# Patient Record
Sex: Female | Born: 1945 | Race: White | Hispanic: No | Marital: Married | Smoking: Current every day smoker
Health system: Southern US, Community
[De-identification: ages and names within clinical notes are randomized; demographics above are authoritative.]

## PROBLEM LIST (undated history)

## (undated) DIAGNOSIS — I219 Acute myocardial infarction, unspecified: Secondary | ICD-10-CM

## (undated) DIAGNOSIS — F4541 Pain disorder exclusively related to psychological factors: Secondary | ICD-10-CM

## (undated) DIAGNOSIS — I351 Nonrheumatic aortic (valve) insufficiency: Secondary | ICD-10-CM

## (undated) DIAGNOSIS — M2550 Pain in unspecified joint: Secondary | ICD-10-CM

## (undated) DIAGNOSIS — I509 Heart failure, unspecified: Secondary | ICD-10-CM

## (undated) DIAGNOSIS — Z860100 Personal history of colon polyps, unspecified: Secondary | ICD-10-CM

## (undated) DIAGNOSIS — M549 Dorsalgia, unspecified: Secondary | ICD-10-CM

## (undated) DIAGNOSIS — Z8709 Personal history of other diseases of the respiratory system: Secondary | ICD-10-CM

## (undated) DIAGNOSIS — J189 Pneumonia, unspecified organism: Secondary | ICD-10-CM

## (undated) DIAGNOSIS — E785 Hyperlipidemia, unspecified: Secondary | ICD-10-CM

## (undated) DIAGNOSIS — M1990 Unspecified osteoarthritis, unspecified site: Secondary | ICD-10-CM

## (undated) DIAGNOSIS — Z720 Tobacco use: Secondary | ICD-10-CM

## (undated) DIAGNOSIS — M2540 Effusion, unspecified joint: Secondary | ICD-10-CM

## (undated) DIAGNOSIS — G8929 Other chronic pain: Secondary | ICD-10-CM

## (undated) DIAGNOSIS — I255 Ischemic cardiomyopathy: Secondary | ICD-10-CM

## (undated) DIAGNOSIS — J410 Simple chronic bronchitis: Secondary | ICD-10-CM

## (undated) DIAGNOSIS — I2510 Atherosclerotic heart disease of native coronary artery without angina pectoris: Secondary | ICD-10-CM

## (undated) HISTORY — PX: BREAST BIOPSY: SHX20

## (undated) HISTORY — PX: ABDOMINAL HYSTERECTOMY: SHX81

## (undated) HISTORY — DX: Heart failure, unspecified: I50.9

## (undated) HISTORY — PX: OTHER SURGICAL HISTORY: SHX169

## (undated) HISTORY — PX: SHOULDER SURGERY: SHX246

## (undated) HISTORY — DX: Acute myocardial infarction, unspecified: I21.9

## (undated) HISTORY — PX: CHOLECYSTECTOMY: SHX55

## (undated) HISTORY — DX: Nonrheumatic aortic (valve) insufficiency: I35.1

## (undated) HISTORY — PX: ESOPHAGOGASTRODUODENOSCOPY: SHX1529

## (undated) HISTORY — PX: ERCP: SHX60

## (undated) HISTORY — PX: TONSILLECTOMY: SUR1361

## (undated) HISTORY — DX: Tobacco use: Z72.0

## (undated) HISTORY — DX: Ischemic cardiomyopathy: I25.5

## (undated) HISTORY — DX: Atherosclerotic heart disease of native coronary artery without angina pectoris: I25.10

## (undated) HISTORY — DX: Hyperlipidemia, unspecified: E78.5

---

## 2011-11-17 DIAGNOSIS — J019 Acute sinusitis, unspecified: Secondary | ICD-10-CM | POA: Diagnosis not present

## 2011-11-17 DIAGNOSIS — J209 Acute bronchitis, unspecified: Secondary | ICD-10-CM | POA: Diagnosis not present

## 2011-11-28 DIAGNOSIS — J189 Pneumonia, unspecified organism: Secondary | ICD-10-CM | POA: Diagnosis not present

## 2012-04-07 DIAGNOSIS — J209 Acute bronchitis, unspecified: Secondary | ICD-10-CM | POA: Diagnosis not present

## 2012-04-07 DIAGNOSIS — S335XXA Sprain of ligaments of lumbar spine, initial encounter: Secondary | ICD-10-CM | POA: Diagnosis not present

## 2012-05-14 DIAGNOSIS — H81399 Other peripheral vertigo, unspecified ear: Secondary | ICD-10-CM | POA: Diagnosis not present

## 2012-06-06 DIAGNOSIS — J189 Pneumonia, unspecified organism: Secondary | ICD-10-CM

## 2012-06-06 DIAGNOSIS — Z8709 Personal history of other diseases of the respiratory system: Secondary | ICD-10-CM

## 2012-06-06 HISTORY — DX: Pneumonia, unspecified organism: J18.9

## 2012-06-06 HISTORY — DX: Personal history of other diseases of the respiratory system: Z87.09

## 2012-09-26 ENCOUNTER — Other Ambulatory Visit: Payer: Self-pay | Admitting: Family Medicine

## 2012-12-14 ENCOUNTER — Emergency Department (HOSPITAL_COMMUNITY)
Admission: EM | Admit: 2012-12-14 | Discharge: 2012-12-14 | Disposition: A | Discharge status: Home / Self Care | Payer: Medicare Other | Attending: Emergency Medicine | Admitting: Emergency Medicine

## 2012-12-14 ENCOUNTER — Encounter (HOSPITAL_COMMUNITY): Payer: Self-pay | Admitting: *Deleted

## 2012-12-14 ENCOUNTER — Emergency Department (HOSPITAL_COMMUNITY): Payer: Medicare Other

## 2012-12-14 DIAGNOSIS — R059 Cough, unspecified: Secondary | ICD-10-CM | POA: Diagnosis not present

## 2012-12-14 DIAGNOSIS — R002 Palpitations: Secondary | ICD-10-CM | POA: Insufficient documentation

## 2012-12-14 DIAGNOSIS — R0602 Shortness of breath: Secondary | ICD-10-CM | POA: Insufficient documentation

## 2012-12-14 DIAGNOSIS — R0789 Other chest pain: Secondary | ICD-10-CM | POA: Diagnosis not present

## 2012-12-14 DIAGNOSIS — J3489 Other specified disorders of nose and nasal sinuses: Secondary | ICD-10-CM | POA: Diagnosis not present

## 2012-12-14 DIAGNOSIS — R05 Cough: Secondary | ICD-10-CM | POA: Insufficient documentation

## 2012-12-14 DIAGNOSIS — I499 Cardiac arrhythmia, unspecified: Secondary | ICD-10-CM | POA: Diagnosis not present

## 2012-12-14 DIAGNOSIS — R079 Chest pain, unspecified: Secondary | ICD-10-CM | POA: Diagnosis not present

## 2012-12-14 LAB — CBC
MCHC: 34.4 g/dL (ref 30.0–36.0)
Platelets: 287 10*3/uL (ref 150–400)
RDW: 14.8 % (ref 11.5–15.5)
WBC: 11.1 10*3/uL — ABNORMAL HIGH (ref 4.0–10.5)

## 2012-12-14 LAB — BASIC METABOLIC PANEL
Chloride: 99 mEq/L (ref 96–112)
Creatinine, Ser: 0.82 mg/dL (ref 0.50–1.10)
GFR calc Af Amer: 85 mL/min — ABNORMAL LOW (ref >90)
GFR calc non Af Amer: 73 mL/min — ABNORMAL LOW (ref >90)
Potassium: 3.9 mEq/L (ref 3.5–5.1)

## 2012-12-14 LAB — TROPONIN I: Troponin I: <0.3 ng/mL (ref <0.30)

## 2012-12-14 LAB — POCT I-STAT TROPONIN I

## 2012-12-14 NOTE — ED Notes (Signed)
Pt in c/o chest pressure and shortness of breath over the last week, states the symptoms have been intermittent, states she has been having episodes of palpitations where she feels like her heart is racing, feels like it is fluttering. C/o chest "discomfort" at this time, denies shortness of breath at this time. Pt states when she has more severe episodes of chest pain that it radiates into her neck, also c/o dizziness.

## 2012-12-14 NOTE — ED Provider Notes (Signed)
History    CSN: [id number] Arrival date & time 12/14/12  1158  First MD Initiated Contact with Patient 12/14/12 1212     Chief Complaint  Patient presents with  . Chest Pain   (Consider location/radiation/quality/duration/timing/severity/associated sxs/prior Treatment) HPI Comments: Pt is a 67yo female with about 1 week of intermittent chest discomfort/palpitations and SOB. Episodes last several minutes and occur at random without known triggers, and resolve spontaneously. Pt reports "several" short episodes over the past 5-7 days and state they have never happened before. Pt describes the sensation of her heart "fluttering" in her chest and occasionally making it difficult for her to breathe. Pt has no current frank pain but some vague discomfort. Pain does not radiate to her neck or shoulders or back. Discomfort is not worse with deep breathing and is not crushing in nature. Pt does not take any medications and has no known significant history but does not have a regular physician. She was seen at Urgent Care today and sent for ED evaluation.  Otherwise pt has few symptoms. Has had some cough/congestion with greenish phlegm production but no fevers/chills, sick contacts, LE swelling, abdominal pain, change in bowel/bladder habits.  Patient is a 67 y.o. female presenting with chest pain. The history is provided by the patient and the spouse. No language interpreter was used.  Chest Pain Associated symptoms: cough and shortness of breath   Associated symptoms: no abdominal pain, no back pain, no dizziness, no fever, no nausea, no numbness, not vomiting and no weakness    History reviewed. No pertinent past medical history. History reviewed. No pertinent past surgical history. History reviewed. No pertinent family history. History  Substance Use Topics  . Smoking status: Not on file  . Smokeless tobacco: Not on file  . Alcohol Use: Not on file   OB History   Grav Para Term Preterm  Abortions TAB SAB Ect Mult Living                 Review of Systems  Constitutional: Negative for fever and chills.  HENT: Positive for congestion and rhinorrhea. Negative for sore throat and sneezing.   Respiratory: Positive for cough, chest tightness and shortness of breath. Negative for wheezing.   Cardiovascular: Positive for chest pain. Negative for leg swelling.  Gastrointestinal: Negative for nausea, vomiting, abdominal pain and diarrhea.  Genitourinary: Negative for dysuria, urgency, frequency and hematuria.  Musculoskeletal: Negative for back pain and joint swelling.  Skin: Negative for rash.  Neurological: Negative for dizziness, syncope, speech difficulty, weakness and numbness.    Allergies  Review of patient's allergies indicates no known allergies.  Home Medications   Current Outpatient Rx  Name  Route  Sig  Dispense  Refill  . acetaminophen (TYLENOL) 500 MG tablet   Oral   Take 500 mg by mouth every 6 (six) hours as needed for pain.         . pseudoephedrine-guaifenesin (MUCINEX D) 60-600 MG per tablet   Oral   Take 1 tablet by mouth every 12 (twelve) hours.          BP 137/71  Pulse 83  Temp(Src) 97.6 F (36.4 C) (Oral)  Resp 20  Wt 195 lb (88.451 kg)  SpO2 94% Physical Exam  Nursing note and vitals reviewed. Constitutional: She is oriented to person, place, and time. She appears well-developed and well-nourished. No distress.  HENT:  Head: Normocephalic and atraumatic.  Eyes: Conjunctivae are normal. Pupils are equal, round, and reactive to light.  Neck: Normal range of motion. Neck supple.  Cardiovascular: Normal rate, regular rhythm and normal heart sounds.   No murmur heard. Pulmonary/Chest: Effort normal and breath sounds normal. No respiratory distress. She has no wheezes. She has no rales. She exhibits tenderness and bony tenderness. She exhibits no crepitus, no edema and no deformity.  Parasternal and left chest wall tenderness to  palpation, mild/moderate  Abdominal: Soft. Bowel sounds are normal. She exhibits no distension. There is no tenderness.  Musculoskeletal: Normal range of motion. She exhibits no edema and no tenderness.  Neurological: She is alert and oriented to person, place, and time. No cranial nerve deficit.  Skin: Skin is warm and dry. She is not diaphoretic.  Psychiatric: She has a normal mood and affect. Her behavior is normal.    ED Course  Procedures (including critical care time)  Date: 12/14/2012   Rate: 93   Rhythm: sinus with several extra/early complexes (?junctional beats)   QRS Axis: normal   Intervals: normal   ST/T Wave abnormalities: none   Conduction Disutrbances: extra early complexes   Narrative Interpretation: sinus rhythm with early complexes, PAC's vs junctional beats   Old EKG Reviewed: none available  1235: Exam and hx as above, pt hemodynamically stable without active complaints. DDx intermittent afib/aflutter, early PAC's/junctional beats. Some possible MSK chest discomfort as well. Will check CXR, labs, troponin, then reassess.  1315: POC troponin negative. CXR, CBC, BMP, and serum troponin still pending.   1350: CBC, BMP unremarkable.  Labs Reviewed  CBC - Abnormal; Notable for the following:    WBC 11.1 (*)    All other components within normal limits  BASIC METABOLIC PANEL - Abnormal; Notable for the following:    GFR calc non Af Amer 73 (*)    GFR calc Af Amer 85 (*)    All other components within normal limits  TROPONIN I  POCT I-STAT TROPONIN I   Dg Chest 2 View  12/14/2012   *RADIOLOGY REPORT*  Clinical Data: Chest pain  CHEST - 2 VIEW  Comparison: None.  Findings: Cardiomediastinal silhouette is unremarkable.  No acute infiltrate or pleural effusion.  No pulmonary edema.  Bony thorax is unremarkable.  IMPRESSION: No active disease.   Original Report Authenticated By: Natasha Mead, M.D.   1. Chest discomfort   2. Irregular heart beats     MDM  [phone]  female with intermittent chest discomfort. EKG concerning for extra complexes and history suggestive of possible SVT. Doubt MI with chest discomfort >1 week and negative troponin. Doubt PNA with normal CXR. Pt with elevated BP but no meds at home and no PCP. Advised pt to establish with a PCP. Also provided with number for [facility]. Strict return precautions discussed.  The above was discussed in its entirety with attending ED physician Dr. Deretha Emory.  Bobbye Morton, MD  PGY-2, [facility] Medicine  Bobbye Morton, MD 12/14/12 [phone]

## 2012-12-14 NOTE — ED Provider Notes (Signed)
I saw and evaluated the patient, reviewed the resident's note and I agree with the findings and plan.  Patient referred in from urgent care in [place]. Patient's had one week history of chest pressure that waxes and wanes but never goes away. Also associated with shortness of breath patient was concerned because of shortness of breath and cough that maybe she had developed a pneumonia. She's also been having trouble with heart palpitations and heart racing. Today on the monitor she has an occasional PAC no significant tachycardia. Chest x-ray is pending. Troponin was negative so not likely acute cardiac event. EKG also has no acute changes consistent with a cardiac event. If labs are all negative patient can be discharged with followup with cardiology and assistance in finding a primary care Dr.  Barbaraann Faster EKG reviewed by me and concur with the resident's interpretation.     Shelda Jakes, MD 12/14/12 [phone]

## 2012-12-14 NOTE — ED Notes (Signed)
Family at bedside. 

## 2012-12-17 NOTE — ED Provider Notes (Signed)
I saw and evaluated the patient, reviewed the resident's note and I agree with the findings and plan.   Shelda Jakes, MD 12/17/12 [phone]

## 2013-01-10 ENCOUNTER — Ambulatory Visit (INDEPENDENT_AMBULATORY_CARE_PROVIDER_SITE_OTHER): Payer: Medicare Other | Admitting: Cardiovascular Disease

## 2013-01-10 ENCOUNTER — Encounter: Payer: Self-pay | Admitting: Cardiovascular Disease

## 2013-01-10 VITALS — BP 134/80 | HR 73 | Ht 62.5 in | Wt 194.0 lb

## 2013-01-10 DIAGNOSIS — R5381 Other malaise: Secondary | ICD-10-CM

## 2013-01-10 DIAGNOSIS — E785 Hyperlipidemia, unspecified: Secondary | ICD-10-CM | POA: Diagnosis not present

## 2013-01-10 DIAGNOSIS — Z79899 Other long term (current) drug therapy: Secondary | ICD-10-CM

## 2013-01-10 DIAGNOSIS — R079 Chest pain, unspecified: Secondary | ICD-10-CM | POA: Diagnosis not present

## 2013-01-10 DIAGNOSIS — Z8249 Family history of ischemic heart disease and other diseases of the circulatory system: Secondary | ICD-10-CM | POA: Insufficient documentation

## 2013-01-10 DIAGNOSIS — R5383 Other fatigue: Secondary | ICD-10-CM

## 2013-01-10 MED ORDER — ASPIRIN EC 81 MG PO TBEC: 81.0000 mg | DELAYED_RELEASE_TABLET | Freq: Every day | ORAL | Status: DC

## 2013-01-10 NOTE — Progress Notes (Signed)
   01/10/2013 Terri [name]   11-Jun-1945  [id number]  Primary Physician No PCP Per Patient Primary Cardiologist: Runell Gess MD Roseanne Reno   HPI:  Terri [name] is a 67 year old mildly overweight married Caucasian female mother of 2, grandmother for grandchildren who has no primary care physician. She was replacing most of the hospital emergency room with a dilation tachypalpitations the setting of substernal chest pain and sent home tissues and was referred here for further evaluation. Her only cardiac risk factors include 25 pack years of tobacco abuse currently smoking one half pack per day. She has actually strong family history heart disease with both parents and multiple siblings with either died or had-related issues. She is retired Environmental health practitioner. 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   Current Outpatient Prescriptions  Medication Sig Dispense Refill  . acetaminophen (TYLENOL) 500 MG tablet Take 500 mg by mouth every 6 (six) hours as needed for pain.       No current facility-administered medications for this visit.    No Known Allergies  History   Social History  . Marital Status: Married    Spouse Name: N/A    Number of Children: N/A  . Years of Education: N/A   Occupational History  . Not on file.   Social History Main Topics  . Smoking status: Light Tobacco Smoker -- 0.25 packs/day for 52 years    Types: Cigarettes  . Smokeless tobacco: Never Used  . Alcohol Use: No  . Drug Use: No  . Sexually Active: Not on file   Other Topics Concern  . Not on file   Social History Narrative  . No narrative on file     Review of Systems: General: negative for chills, fever, night sweats or weight changes.  Cardiovascular: negative for chest pain, dyspnea on exertion, edema, orthopnea, palpitations, paroxysmal nocturnal dyspnea or shortness of breath Dermatological: negative for  rash Respiratory: negative for cough or wheezing Urologic: negative for hematuria Abdominal: negative for nausea, vomiting, diarrhea, bright red blood per rectum, melena, or hematemesis Neurologic: negative for visual changes, syncope, or dizziness All other systems reviewed and are otherwise negative except as noted above.    Blood pressure 134/80, pulse 73, height 5' 2.5" (1.588 m), weight 194 lb (87.998 kg).  General appearance: alert and no distress Neck: no adenopathy, no carotid bruit, no JVD, supple, symmetrical, trachea midline and thyroid not enlarged, symmetric, no tenderness/mass/nodules Lungs: clear to auscultation bilaterally Heart: regular rate and rhythm, S1, S2 normal, no murmur, click, rub or gallop Abdomen: soft, non-tender; bowel sounds normal; no masses,  no organomegaly Extremities: extremities normal, atraumatic, no cyanosis or edema Pulses: 2+ and symmetric  EKG vital signs rhythm at 73 without ST or T wave changes  ASSESSMENT AND PLAN:   Chest pain 67 year old female with a strong family history heart disease and recent onset substernal chest pain which sounds ischemic. Her parents and basically all of her siblings have either died or had a heart-related issues. I'm going to get a 2-D echo and a Lexiscan Myoview as well as start her on low-dose aspirin and tender to upper LC back after that for further evaluation. She was recently to [facility] for violation of tachycardia palpitations.      Runell Gess MD FACP,FACC,FAHA, [facility] 01/10/2013 4:47 PM

## 2013-01-10 NOTE — Assessment & Plan Note (Signed)
67 year old female with a strong family history heart disease and recent onset substernal chest pain which sounds ischemic. Her parents and basically all of her siblings have either died or had a heart-related issues. I'm going to get a 2-D echo and a Lexiscan Myoview as well as start her on low-dose aspirin and tender to upper LC back after that for further evaluation. She was recently to [facility] for violation of tachycardia palpitations.

## 2013-01-10 NOTE — Patient Instructions (Signed)
  Your physician wants you to follow-up with him after the test                                                         You will receive a reminder letter in the mail one month in advance. If you don't receive a letter, please call our office to schedule the follow-up appointment.   Your physician recommends that you return for lab work in the next couple of days   Your physician has recommended you make the following change in your medication: start Aspirin 81mg  daily   Your physician has ordered the following tests: echocardiogram and lexiscan myoview

## 2013-01-11 ENCOUNTER — Ambulatory Visit (HOSPITAL_COMMUNITY)
Admission: RE | Admit: 2013-01-11 | Discharge: 2013-01-11 | Disposition: A | Discharge status: Home / Self Care | Payer: Medicare Other | Source: Ambulatory Visit | Attending: Internal Medicine | Admitting: Internal Medicine

## 2013-01-11 DIAGNOSIS — E669 Obesity, unspecified: Secondary | ICD-10-CM | POA: Diagnosis not present

## 2013-01-11 DIAGNOSIS — R079 Chest pain, unspecified: Secondary | ICD-10-CM | POA: Insufficient documentation

## 2013-01-11 DIAGNOSIS — R42 Dizziness and giddiness: Secondary | ICD-10-CM | POA: Diagnosis not present

## 2013-01-11 DIAGNOSIS — R000 Tachycardia, unspecified: Secondary | ICD-10-CM | POA: Diagnosis not present

## 2013-01-11 DIAGNOSIS — R0602 Shortness of breath: Secondary | ICD-10-CM | POA: Insufficient documentation

## 2013-01-11 DIAGNOSIS — R5383 Other fatigue: Secondary | ICD-10-CM | POA: Insufficient documentation

## 2013-01-11 DIAGNOSIS — R0609 Other forms of dyspnea: Secondary | ICD-10-CM | POA: Insufficient documentation

## 2013-01-11 DIAGNOSIS — R5381 Other malaise: Secondary | ICD-10-CM | POA: Diagnosis not present

## 2013-01-11 DIAGNOSIS — F17200 Nicotine dependence, unspecified, uncomplicated: Secondary | ICD-10-CM | POA: Insufficient documentation

## 2013-01-11 DIAGNOSIS — R0989 Other specified symptoms and signs involving the circulatory and respiratory systems: Secondary | ICD-10-CM | POA: Insufficient documentation

## 2013-01-11 DIAGNOSIS — Z8249 Family history of ischemic heart disease and other diseases of the circulatory system: Secondary | ICD-10-CM | POA: Insufficient documentation

## 2013-01-11 MED ORDER — TECHNETIUM TC 99M SESTAMIBI GENERIC - CARDIOLITE
31.3000 | Freq: Once | INTRAVENOUS | Status: AC | PRN
  Administered 2013-01-11: 31.3 via INTRAVENOUS

## 2013-01-11 MED ORDER — TECHNETIUM TC 99M SESTAMIBI GENERIC - CARDIOLITE
11.0000 | Freq: Once | INTRAVENOUS | Status: AC | PRN
  Administered 2013-01-11: 11 via INTRAVENOUS

## 2013-01-11 MED ORDER — REGADENOSON 0.4 MG/5ML IV SOLN
0.4000 mg | Freq: Once | INTRAVENOUS | Status: AC
  Administered 2013-01-11: 0.4 mg via INTRAVENOUS

## 2013-01-11 NOTE — Procedures (Addendum)
[place] [facility] [address] [place] [address] [phone]  Cardiology Nuclear Med Study  Terri Terri [name] is Terri [name] 67 y.o. female     MRN : [id number]     DOB: 1945/08/02  Procedure Date: 01/11/2013  Nuclear Med Background Indication for Stress Test:  Evaluation for Ischemia and Post Hospital History:  NO PRIOR HISTORY Cardiac Risk Factors: Family History - CAD, Obesity and Smoker  Symptoms:  Chest Pain, Dizziness, DOE, Fatigue, Light-Headedness, Palpitations and SOB   Nuclear Pre-Procedure Caffeine/Decaff Intake:  7:00pm NPO After: 5:00am   IV Site: R Wrist  IV 0.9% NS with Angio Cath:  22g  Chest Size (in):  N/Terri [name] IV Started by: Emmit Pomfret, RN  Height: 5' 2.5" (1.588 m)  Cup Size: DD  BMI:  Body mass index is 34.9 kg/(m^2). Weight:  194 lb (87.998 kg)   Tech Comments:  N/Terri [name]    Nuclear Med Study 1 or 2 day study: 1 day  Stress Test Type:  Lexiscan  Order Authorizing Provider:  Nanetta Batty, MD   Resting Radionuclide: Technetium 96m Sestamibi  Resting Radionuclide Dose: 11.0 mCi   Stress Radionuclide:  Technetium 78m Sestamibi  Stress Radionuclide Dose: 31.3 mCi           Stress Protocol Rest HR: 71 Stress HR: 95  Rest BP: 136/89 Stress BP: 152/82  Exercise Time (min): n/Terri [name] METS: n/Terri [name]          Dose of Adenosine (mg):  n/Terri [name] Dose of Lexiscan: 0.4 mg  Dose of Atropine (mg): n/Terri [name] Dose of Dobutamine: n/Terri [name] mcg/kg/min (at max HR)  Stress Test Technologist: Ernestene Mention, CCT Nuclear Technologist: Gonzella Lex, CNMT   Rest Procedure:  Myocardial perfusion imaging was performed at rest 45 minutes following the intravenous administration of Technetium 70m Sestamibi. Stress Procedure:  The patient received IV Lexiscan 0.4 mg over 15-seconds.  Technetium 62m Sestamibi injected at 30-seconds.  There were no significant changes with Lexiscan.  Quantitative spect images were obtained after Terri [name].  Transient Ischemic  Dilatation (Normal <1.22):  1.23 Lung/Heart Ratio (Normal <0.45):  0.25 QGS EDV:  67 ml QGS ESV:  19 ml LV Ejection Fraction: 72%  Signed by     Rest ECG: NSR - Normal EKG  Stress ECG: No significant change from baseline ECG  QPS Raw Data Images:  Normal; no motion artifact; normal heart/lung ratio. Stress Images:  Normal homogeneous uptake in all areas of the myocardium. Rest Images:  Normal homogeneous uptake in all areas of the myocardium. Subtraction (SDS):  Normal  Impression Exercise Capacity:  Lexiscan with no exercise. BP Response:  Normal blood pressure response. Clinical Symptoms:  No significant symptoms noted. ECG Impression:  No significant ST segment change suggestive of ischemia. Comparison with Prior Nuclear Study: No images to compare  Overall Impression:  Normal stress nuclear study.  LV Wall Motion:  NL LV Function, EF 72%; NL Wall Motion   Terri Terri [name] A, MD  01/11/2013 1:05 PM

## 2013-01-13 ENCOUNTER — Encounter: Payer: Self-pay | Admitting: *Deleted

## 2013-01-14 ENCOUNTER — Ambulatory Visit (HOSPITAL_COMMUNITY)
Admission: RE | Admit: 2013-01-14 | Discharge: 2013-01-14 | Disposition: A | Discharge status: Home / Self Care | Payer: Medicare Other | Source: Ambulatory Visit | Attending: Cardiovascular Disease | Admitting: Cardiovascular Disease

## 2013-01-14 DIAGNOSIS — I079 Rheumatic tricuspid valve disease, unspecified: Secondary | ICD-10-CM | POA: Insufficient documentation

## 2013-01-14 DIAGNOSIS — R079 Chest pain, unspecified: Secondary | ICD-10-CM | POA: Diagnosis not present

## 2013-01-14 DIAGNOSIS — I359 Nonrheumatic aortic valve disorder, unspecified: Secondary | ICD-10-CM | POA: Diagnosis not present

## 2013-01-14 DIAGNOSIS — R5383 Other fatigue: Secondary | ICD-10-CM | POA: Diagnosis not present

## 2013-01-14 DIAGNOSIS — E785 Hyperlipidemia, unspecified: Secondary | ICD-10-CM | POA: Insufficient documentation

## 2013-01-14 DIAGNOSIS — Z79899 Other long term (current) drug therapy: Secondary | ICD-10-CM | POA: Diagnosis not present

## 2013-01-14 LAB — COMPREHENSIVE METABOLIC PANEL
ALT: 12 U/L (ref 0–35)
AST: 9 U/L (ref 0–37)
Albumin: 3.9 g/dL (ref 3.5–5.2)
CO2: 26 mEq/L (ref 19–32)
Calcium: 9.4 mg/dL (ref 8.4–10.5)
Chloride: 105 mEq/L (ref 96–112)
Creat: 0.81 mg/dL (ref 0.50–1.10)
Potassium: 4.6 mEq/L (ref 3.5–5.3)
Total Protein: 6.6 g/dL (ref 6.0–8.3)

## 2013-01-14 LAB — CBC
Hemoglobin: 13.3 g/dL (ref 12.0–15.0)
MCH: 28.8 pg (ref 26.0–34.0)
Platelets: 277 10*3/uL (ref 150–400)
RBC: 4.62 MIL/uL (ref 3.87–5.11)
WBC: 8 10*3/uL (ref 4.0–10.5)

## 2013-01-14 LAB — LIPID PANEL
Cholesterol: 243 mg/dL — ABNORMAL HIGH (ref 0–200)
VLDL: 70 mg/dL — ABNORMAL HIGH (ref 0–40)

## 2013-01-14 LAB — TSH: TSH: 2.508 u[IU]/mL (ref 0.350–4.500)

## 2013-01-14 NOTE — Progress Notes (Signed)
2D Echo Performed 01/14/2013    Clearence Ped, RCS

## 2013-01-15 ENCOUNTER — Encounter: Payer: Self-pay | Admitting: Cardiovascular Disease

## 2013-01-16 ENCOUNTER — Ambulatory Visit (INDEPENDENT_AMBULATORY_CARE_PROVIDER_SITE_OTHER): Payer: Medicare Other | Admitting: Cardiovascular Disease

## 2013-01-16 ENCOUNTER — Encounter: Payer: Self-pay | Admitting: Cardiovascular Disease

## 2013-01-16 VITALS — BP 134/82 | HR 72 | Ht 62.5 in | Wt 195.4 lb

## 2013-01-16 DIAGNOSIS — E785 Hyperlipidemia, unspecified: Secondary | ICD-10-CM

## 2013-01-16 DIAGNOSIS — Z79899 Other long term (current) drug therapy: Secondary | ICD-10-CM | POA: Diagnosis not present

## 2013-01-16 DIAGNOSIS — R079 Chest pain, unspecified: Secondary | ICD-10-CM

## 2013-01-16 MED ORDER — ATORVASTATIN CALCIUM 20 MG PO TABS: 20.0000 mg | ORAL_TABLET | Freq: Every day | ORAL | Status: DC

## 2013-01-16 NOTE — Patient Instructions (Addendum)
  Your physician wants you to follow-up with him in : 6 months                                            and with an extender in : 3 months                    You will receive a reminder letter in the mail one month in advance. If you don't receive a letter, please call our office to schedule the follow-up appointment.   Your physician recommends that you return for lab work in: 2-3 months, fasting   Your physician has recommended you make the following change in your medication: start atorvastatin 20mg  daily

## 2013-01-16 NOTE — Assessment & Plan Note (Signed)
2-D echocardiogram and Myoview stress test were entirely normal. I reassured the patient. She is under a lot of stress at home.

## 2013-01-16 NOTE — Progress Notes (Signed)
01/16/2013 Terri [name]   [birth year]  [id number]  Primary Physician No PCP Per Patient Primary Cardiologist: Runell Gess MD Roseanne Reno  HPI:  [name] is a 67 year old mildly overweight married Caucasian female mother of 2, grandmother for grandchildren who has no primary care physician. She was replacing most of the hospital emergency room with a dilation tachypalpitations the setting of substernal chest pain and sent home tissues and was referred here for further evaluation. Her only cardiac risk factors include 25 pack years of tobacco abuse currently smoking one half pack per day. She has actually strong family history heart disease with both parents and multiple siblings with either died or had-related issues. She is retired Environmental health practitioner. 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 Since I saw her in the office approximately one week ago a 2-D echo was performed which was entirely normal as was a Myoview stress test. She's had no recurrent symptoms. She does admit to a lot of stress at home. A lipid profile performed 01/14/13 revealed a total cholesterol of 243, LDL of 141 and HDL of 32.    Current Outpatient Prescriptions  Medication Sig Dispense Refill  . acetaminophen (TYLENOL) 500 MG tablet Take 500 mg by mouth every 6 (six) hours as needed for pain.      Marland Kitchen aspirin EC 81 MG tablet Take 1 tablet (81 mg total) by mouth daily.  90 tablet  3  . atorvastatin (LIPITOR) 20 MG tablet Take 1 tablet (20 mg total) by mouth daily.  30 tablet  6   No current facility-administered medications for this visit.    No Known Allergies  History   Social History  . Marital Status: Married    Spouse Name: N/A    Number of Children: N/A  . Years of Education: N/A   Occupational History  . Not on file.   Social History Main Topics  . Smoking status: Light Tobacco Smoker -- 0.25 packs/day for 52 years    Types:  Cigarettes  . Smokeless tobacco: Never Used  . Alcohol Use: No  . Drug Use: No  . Sexual Activity: Not on file   Other Topics Concern  . Not on file   Social History Narrative  . No narrative on file     Review of Systems: General: negative for chills, fever, night sweats or weight changes.  Cardiovascular: negative for chest pain, dyspnea on exertion, edema, orthopnea, palpitations, paroxysmal nocturnal dyspnea or shortness of breath Dermatological: negative for rash Respiratory: negative for cough or wheezing Urologic: negative for hematuria Abdominal: negative for nausea, vomiting, diarrhea, bright red blood per rectum, melena, or hematemesis Neurologic: negative for visual changes, syncope, or dizziness All other systems reviewed and are otherwise negative except as noted above.    Blood pressure 134/82, pulse 72, height 5' 2.5" (1.588 m), weight 195 lb 6.4 oz (88.633 kg).  General appearance: alert and no distress Neck: no adenopathy, no carotid bruit, no JVD, supple, symmetrical, trachea midline and thyroid not enlarged, symmetric, no tenderness/mass/nodules Lungs: clear to auscultation bilaterally Heart: regular rate and rhythm, S1, S2 normal, no murmur, click, rub or gallop Extremities: extremities normal, atraumatic, no cyanosis or edema  EKG not performed today  ASSESSMENT AND PLAN:   Chest pain 2-D echocardiogram and Myoview stress test were entirely normal. I reassured the patient. She is under a lot of stress at home.  Hyperlipidemia Her fasting lipid profile performed 01/14/13  revealed a glucose of 243, LDL of 141 HDL 32. Based on this and her risk factor profile I am going to begin her on Lipitor  20 mg a day as well as Co Q 10 and will recheck a lipid and liver profile in 2 months.      Runell Gess MD FACP,FACC,FAHA, [facility] 01/16/2013 11:03 AM

## 2013-01-16 NOTE — Assessment & Plan Note (Signed)
Her fasting lipid profile performed 01/14/13 revealed a glucose of 243, LDL of 141 HDL 32. Based on this and her risk factor profile I am going to begin her on Lipitor  20 mg a day as well as Co Q 10 and will recheck a lipid and liver profile in 2 months.

## 2013-01-30 ENCOUNTER — Telehealth: Payer: Self-pay | Admitting: *Deleted

## 2013-01-30 DIAGNOSIS — Z79899 Other long term (current) drug therapy: Secondary | ICD-10-CM

## 2013-01-30 DIAGNOSIS — E785 Hyperlipidemia, unspecified: Secondary | ICD-10-CM

## 2013-01-30 MED ORDER — ATORVASTATIN CALCIUM 40 MG PO TABS: 40.0000 mg | ORAL_TABLET | Freq: Every day | ORAL | Status: DC

## 2013-01-30 NOTE — Telephone Encounter (Signed)
I spoke with patient and informed her of lab results.  She is currently only taking atorvastatin 20mg .  I will speak with Dr Allyson Sabal about increasing the dose to 40mg  vs 80mg .   I spoke with Dr Allyson Sabal.  I received a voice order from Dr Allyson Sabal to increase atorvastatin to 40mg  daily, not the 80mg  daily.  Pt notified. RX sent and will mail lab order to recheck

## 2013-01-30 NOTE — Telephone Encounter (Signed)
Message copied by Marella Bile on Wed Jan 30, 2013  9:59 AM ------      Message from: Runell Gess      Created: Thu Jan 24, 2013  7:49 PM       Increase atorvastatin to 80 mg and re check ------

## 2013-02-25 ENCOUNTER — Encounter: Payer: Medicare Other | Admitting: Cardiology

## 2013-03-09 DIAGNOSIS — J019 Acute sinusitis, unspecified: Secondary | ICD-10-CM | POA: Diagnosis not present

## 2013-03-09 DIAGNOSIS — J209 Acute bronchitis, unspecified: Secondary | ICD-10-CM | POA: Diagnosis not present

## 2013-03-18 DIAGNOSIS — R0609 Other forms of dyspnea: Secondary | ICD-10-CM | POA: Diagnosis not present

## 2013-03-18 DIAGNOSIS — R05 Cough: Secondary | ICD-10-CM | POA: Diagnosis not present

## 2013-03-18 DIAGNOSIS — J0100 Acute maxillary sinusitis, unspecified: Secondary | ICD-10-CM | POA: Diagnosis not present

## 2013-04-22 ENCOUNTER — Ambulatory Visit: Payer: Medicare Other | Admitting: Cardiology

## 2013-04-26 DIAGNOSIS — J00 Acute nasopharyngitis [common cold]: Secondary | ICD-10-CM | POA: Diagnosis not present

## 2013-10-16 DIAGNOSIS — H2510 Age-related nuclear cataract, unspecified eye: Secondary | ICD-10-CM | POA: Diagnosis not present

## 2013-11-20 DIAGNOSIS — H2510 Age-related nuclear cataract, unspecified eye: Secondary | ICD-10-CM | POA: Diagnosis not present

## 2013-11-20 DIAGNOSIS — H40039 Anatomical narrow angle, unspecified eye: Secondary | ICD-10-CM | POA: Diagnosis not present

## 2013-12-09 DIAGNOSIS — H2589 Other age-related cataract: Secondary | ICD-10-CM | POA: Diagnosis not present

## 2013-12-23 DIAGNOSIS — Z961 Presence of intraocular lens: Secondary | ICD-10-CM | POA: Diagnosis not present

## 2013-12-23 DIAGNOSIS — H2510 Age-related nuclear cataract, unspecified eye: Secondary | ICD-10-CM | POA: Diagnosis not present

## 2013-12-23 DIAGNOSIS — H269 Unspecified cataract: Secondary | ICD-10-CM | POA: Diagnosis not present

## 2013-12-23 DIAGNOSIS — H2589 Other age-related cataract: Secondary | ICD-10-CM | POA: Diagnosis not present

## 2014-01-20 DIAGNOSIS — H2589 Other age-related cataract: Secondary | ICD-10-CM | POA: Diagnosis not present

## 2014-01-20 DIAGNOSIS — H2510 Age-related nuclear cataract, unspecified eye: Secondary | ICD-10-CM | POA: Diagnosis not present

## 2014-01-20 DIAGNOSIS — H269 Unspecified cataract: Secondary | ICD-10-CM | POA: Diagnosis not present

## 2014-01-27 DIAGNOSIS — H2510 Age-related nuclear cataract, unspecified eye: Secondary | ICD-10-CM | POA: Diagnosis not present

## 2014-01-27 DIAGNOSIS — Z961 Presence of intraocular lens: Secondary | ICD-10-CM | POA: Diagnosis not present

## 2014-04-08 DIAGNOSIS — Z23 Encounter for immunization: Secondary | ICD-10-CM | POA: Diagnosis not present

## 2014-06-04 DIAGNOSIS — M545 Low back pain: Secondary | ICD-10-CM | POA: Diagnosis not present

## 2014-06-04 DIAGNOSIS — M7061 Trochanteric bursitis, right hip: Secondary | ICD-10-CM | POA: Diagnosis not present

## 2014-06-20 ENCOUNTER — Other Ambulatory Visit: Payer: Self-pay | Admitting: Orthopaedic Surgery

## 2014-06-20 ENCOUNTER — Ambulatory Visit
Admission: RE | Admit: 2014-06-20 | Discharge: 2014-06-20 | Disposition: A | Discharge status: Home / Self Care | Payer: Medicare Other | Source: Ambulatory Visit | Attending: Orthopaedic Surgery | Admitting: Orthopaedic Surgery

## 2014-06-20 DIAGNOSIS — M545 Low back pain: Secondary | ICD-10-CM | POA: Diagnosis not present

## 2014-06-20 DIAGNOSIS — M5136 Other intervertebral disc degeneration, lumbar region: Secondary | ICD-10-CM | POA: Diagnosis not present

## 2014-06-20 DIAGNOSIS — M7061 Trochanteric bursitis, right hip: Secondary | ICD-10-CM | POA: Diagnosis not present

## 2014-06-20 DIAGNOSIS — M5126 Other intervertebral disc displacement, lumbar region: Secondary | ICD-10-CM | POA: Diagnosis not present

## 2014-06-20 DIAGNOSIS — M4806 Spinal stenosis, lumbar region: Secondary | ICD-10-CM | POA: Diagnosis not present

## 2014-06-23 DIAGNOSIS — M545 Low back pain: Secondary | ICD-10-CM | POA: Diagnosis not present

## 2014-06-23 DIAGNOSIS — M7061 Trochanteric bursitis, right hip: Secondary | ICD-10-CM | POA: Diagnosis not present

## 2014-07-14 ENCOUNTER — Other Ambulatory Visit (HOSPITAL_COMMUNITY): Payer: Self-pay | Admitting: Orthopaedic Surgery

## 2014-07-15 ENCOUNTER — Encounter (HOSPITAL_COMMUNITY): Payer: Self-pay

## 2014-07-15 ENCOUNTER — Ambulatory Visit (HOSPITAL_COMMUNITY)
Admission: RE | Admit: 2014-07-15 | Discharge: 2014-07-15 | Disposition: A | Discharge status: Home / Self Care | Payer: Medicare Other | Source: Ambulatory Visit | Attending: Orthopaedic Surgery | Admitting: Orthopaedic Surgery

## 2014-07-15 ENCOUNTER — Encounter (HOSPITAL_COMMUNITY)
Admission: RE | Admit: 2014-07-15 | Discharge: 2014-07-15 | Disposition: A | Discharge status: Home / Self Care | Payer: Medicare Other | Source: Ambulatory Visit | Attending: Orthopaedic Surgery | Admitting: Orthopaedic Surgery

## 2014-07-15 DIAGNOSIS — Z720 Tobacco use: Secondary | ICD-10-CM | POA: Insufficient documentation

## 2014-07-15 DIAGNOSIS — Z01818 Encounter for other preprocedural examination: Secondary | ICD-10-CM | POA: Insufficient documentation

## 2014-07-15 DIAGNOSIS — M5126 Other intervertebral disc displacement, lumbar region: Secondary | ICD-10-CM | POA: Diagnosis not present

## 2014-07-15 DIAGNOSIS — Z01812 Encounter for preprocedural laboratory examination: Secondary | ICD-10-CM | POA: Insufficient documentation

## 2014-07-15 HISTORY — DX: Personal history of colonic polyps: Z86.010

## 2014-07-15 HISTORY — DX: Effusion, unspecified joint: M25.40

## 2014-07-15 HISTORY — DX: Other chronic pain: M54.9

## 2014-07-15 HISTORY — DX: Pain disorder exclusively related to psychological factors: F45.41

## 2014-07-15 HISTORY — DX: Pneumonia, unspecified organism: J18.9

## 2014-07-15 HISTORY — DX: Personal history of colon polyps, unspecified: Z86.0100

## 2014-07-15 HISTORY — DX: Simple chronic bronchitis: J41.0

## 2014-07-15 HISTORY — DX: Pain in unspecified joint: M25.50

## 2014-07-15 HISTORY — DX: Other chronic pain: G89.29

## 2014-07-15 HISTORY — DX: Personal history of other diseases of the respiratory system: Z87.09

## 2014-07-15 HISTORY — DX: Unspecified osteoarthritis, unspecified site: M19.90

## 2014-07-15 LAB — PROTIME-INR
INR: 0.93 (ref 0.00–1.49)
Prothrombin Time: 12.6 seconds (ref 11.6–15.2)

## 2014-07-15 LAB — CBC
HCT: 40.8 % (ref 36.0–46.0)
Hemoglobin: 13.4 g/dL (ref 12.0–15.0)
MCH: 29 pg (ref 26.0–34.0)
MCHC: 32.8 g/dL (ref 30.0–36.0)
MCV: 88.3 fL (ref 78.0–100.0)
PLATELETS: 284 10*3/uL (ref 150–400)
RBC: 4.62 MIL/uL (ref 3.87–5.11)
RDW: 14.7 % (ref 11.5–15.5)
WBC: 10.9 10*3/uL — ABNORMAL HIGH (ref 4.0–10.5)

## 2014-07-15 LAB — SURGICAL PCR SCREEN
MRSA, PCR: NEGATIVE
Staphylococcus aureus: NEGATIVE

## 2014-07-15 MED ORDER — CHLORHEXIDINE GLUCONATE 4 % EX LIQD: 60.0000 mL | Freq: Once | CUTANEOUS | Status: DC

## 2014-07-15 NOTE — Pre-Procedure Instructions (Signed)
ADELYNNE JOERGER  07/15/2014   Your procedure is scheduled on:  Fri, Feb 19 @ 10:00 AM  Report to [facility]  at 8:00 AM.  Call this number if you have problems the morning of surgery: [phone]   Remember:   Do not eat food or drink liquids after midnight.                Stop taking your Aspirin and Naproxen. No Goody's,BC's,Ibuprofen,Fish Oil,or any Herbal Medications   Do not wear jewelry, make-up or nail polish.  Do not wear lotions, powders, or perfumes. You may wear deodorant.  Do not shave 48 hours prior to surgery.   Do not bring valuables to the hospital.  [facility] is not responsible                  for any belongings or valuables.               Contacts, dentures or bridgework may not be worn into surgery.  Leave suitcase in the car. After surgery it may be brought to your room.  For patients admitted to the hospital, discharge time is determined by your                treatment team.               Patients discharged the day of surgery will not be allowed to drive  Home.   Special Instructions:  [facility] - Preparing for Surgery  Before surgery, you can play an important role.  Because skin is not sterile, your skin needs to be as free of germs as possible.  You can reduce the number of germs on you skin by washing with CHG (chlorahexidine gluconate) soap before surgery.  CHG is an antiseptic cleaner which kills germs and bonds with the skin to continue killing germs even after washing.  Please DO NOT use if you have an allergy to CHG or antibacterial soaps.  If your skin becomes reddened/irritated stop using the CHG and inform your nurse when you arrive at Short Stay.  Do not shave (including legs and underarms) for at least 48 hours prior to the first CHG shower.  You may shave your face.  Please follow these instructions carefully:   1.  Shower with CHG Soap the night before surgery and the                                morning of Surgery.  2.  If  you choose to wash your hair, wash your hair first as usual with your       normal shampoo.  3.  After you shampoo, rinse your hair and body thoroughly to remove the                      Shampoo.  4.  Use CHG as you would any other liquid soap.  You can apply chg directly       to the skin and wash gently with scrungie or a clean washcloth.  5.  Apply the CHG Soap to your body ONLY FROM THE NECK DOWN.        Do not use on open wounds or open sores.  Avoid contact with your eyes,       ears, mouth and genitals (private parts).  Wash genitals (private parts)  with your normal soap.  6.  Wash thoroughly, paying special attention to the area where your surgery        will be performed.  7.  Thoroughly rinse your body with warm water from the neck down.  8.  DO NOT shower/wash with your normal soap after using and rinsing off       the CHG Soap.  9.  Pat yourself dry with a clean towel.            10.  Wear clean pajamas.            11.  Place clean sheets on your bed the night of your first shower and do not        sleep with pets.  Day of Surgery  Do not apply any lotions/deoderants the morning of surgery.  Please wear clean clothes to the hospital/surgery center.     Please read over the following fact sheets that you were given: Pain Booklet, Coughing and Deep Breathing, MRSA Information and Surgical Site Infection Prevention

## 2014-07-15 NOTE — Progress Notes (Addendum)
Saw a cardiologist in 2014 but didn't require follow up-Dr.Berry's note in epic  Echo and Stress test in epic from 2014  Denies ever having a heart cath  Pt doesn't have a medical md  Denies EKG or CXR in past yr

## 2014-07-24 MED ORDER — CEFAZOLIN SODIUM-DEXTROSE 2-3 GM-% IV SOLR
2.0000 g | INTRAVENOUS | Status: AC
  Administered 2014-07-25: 2 g via INTRAVENOUS
  Filled 2014-07-24: qty 50

## 2014-07-25 ENCOUNTER — Encounter (HOSPITAL_COMMUNITY): Payer: Self-pay | Admitting: *Deleted

## 2014-07-25 ENCOUNTER — Encounter (HOSPITAL_COMMUNITY)
Admission: RE | Disposition: A | Discharge status: Home / Self Care | Payer: Self-pay | Source: Ambulatory Visit | Attending: Orthopaedic Surgery

## 2014-07-25 ENCOUNTER — Inpatient Hospital Stay (HOSPITAL_COMMUNITY): Payer: Medicare Other | Admitting: Anesthesiology

## 2014-07-25 ENCOUNTER — Inpatient Hospital Stay (HOSPITAL_COMMUNITY): Payer: Medicare Other

## 2014-07-25 ENCOUNTER — Inpatient Hospital Stay (HOSPITAL_COMMUNITY)
Admission: RE | Admit: 2014-07-25 | Discharge: 2014-07-26 | DRG: 519 | Disposition: A | Discharge status: Home / Self Care | Payer: Medicare Other | Source: Ambulatory Visit | Attending: Orthopaedic Surgery | Admitting: Orthopaedic Surgery

## 2014-07-25 DIAGNOSIS — K219 Gastro-esophageal reflux disease without esophagitis: Secondary | ICD-10-CM | POA: Diagnosis present

## 2014-07-25 DIAGNOSIS — Z79899 Other long term (current) drug therapy: Secondary | ICD-10-CM | POA: Diagnosis not present

## 2014-07-25 DIAGNOSIS — D62 Acute posthemorrhagic anemia: Secondary | ICD-10-CM | POA: Diagnosis not present

## 2014-07-25 DIAGNOSIS — M5136 Other intervertebral disc degeneration, lumbar region: Secondary | ICD-10-CM | POA: Diagnosis present

## 2014-07-25 DIAGNOSIS — F17210 Nicotine dependence, cigarettes, uncomplicated: Secondary | ICD-10-CM | POA: Diagnosis present

## 2014-07-25 DIAGNOSIS — M549 Dorsalgia, unspecified: Secondary | ICD-10-CM | POA: Diagnosis not present

## 2014-07-25 DIAGNOSIS — M1990 Unspecified osteoarthritis, unspecified site: Secondary | ICD-10-CM | POA: Diagnosis present

## 2014-07-25 DIAGNOSIS — Z9889 Other specified postprocedural states: Secondary | ICD-10-CM

## 2014-07-25 DIAGNOSIS — I700 Atherosclerosis of aorta: Secondary | ICD-10-CM | POA: Diagnosis not present

## 2014-07-25 DIAGNOSIS — Z981 Arthrodesis status: Secondary | ICD-10-CM | POA: Diagnosis not present

## 2014-07-25 DIAGNOSIS — M5126 Other intervertebral disc displacement, lumbar region: Secondary | ICD-10-CM | POA: Diagnosis not present

## 2014-07-25 DIAGNOSIS — M4806 Spinal stenosis, lumbar region: Secondary | ICD-10-CM | POA: Diagnosis not present

## 2014-07-25 HISTORY — PX: LUMBAR LAMINECTOMY: SHX95

## 2014-07-25 LAB — COMPREHENSIVE METABOLIC PANEL
ALT: 14 U/L (ref 0–35)
ANION GAP: 8 (ref 5–15)
AST: 16 U/L (ref 0–37)
Albumin: 3.6 g/dL (ref 3.5–5.2)
Alkaline Phosphatase: 117 U/L (ref 39–117)
BUN: 9 mg/dL (ref 6–23)
CALCIUM: 8.9 mg/dL (ref 8.4–10.5)
CHLORIDE: 109 mmol/L (ref 96–112)
CO2: 24 mmol/L (ref 19–32)
Creatinine, Ser: 0.9 mg/dL (ref 0.50–1.10)
GFR calc non Af Amer: 65 mL/min — ABNORMAL LOW (ref >90)
GFR, EST AFRICAN AMERICAN: 75 mL/min — AB (ref >90)
Glucose, Bld: 100 mg/dL — ABNORMAL HIGH (ref 70–99)
Potassium: 4.2 mmol/L (ref 3.5–5.1)
Sodium: 141 mmol/L (ref 135–145)
TOTAL PROTEIN: 6.4 g/dL (ref 6.0–8.3)
Total Bilirubin: 1 mg/dL (ref 0.3–1.2)

## 2014-07-25 SURGERY — MICRODISCECTOMY LUMBAR LAMINECTOMY
Anesthesia: General | Laterality: Right

## 2014-07-25 MED ORDER — ONDANSETRON HCL 4 MG/2ML IJ SOLN
4.0000 mg | INTRAMUSCULAR | Status: DC | PRN
  Administered 2014-07-25: 4 mg via INTRAVENOUS
  Filled 2014-07-25: qty 2

## 2014-07-25 MED ORDER — LACTATED RINGERS IV SOLN
INTRAVENOUS | Status: DC | PRN
  Administered 2014-07-25 (×2): via INTRAVENOUS

## 2014-07-25 MED ORDER — METHOCARBAMOL 500 MG PO TABS
500.0000 mg | ORAL_TABLET | Freq: Four times a day (QID) | ORAL | Status: DC | PRN
  Administered 2014-07-25 – 2014-07-26 (×3): 500 mg via ORAL
  Filled 2014-07-25 (×2): qty 1

## 2014-07-25 MED ORDER — ONDANSETRON HCL 4 MG/2ML IJ SOLN
INTRAMUSCULAR | Status: DC | PRN
  Administered 2014-07-25: 4 mg via INTRAVENOUS

## 2014-07-25 MED ORDER — ATORVASTATIN CALCIUM 40 MG PO TABS: 40.0000 mg | ORAL_TABLET | Freq: Every day | ORAL | Status: DC

## 2014-07-25 MED ORDER — OXYCODONE-ACETAMINOPHEN 5-325 MG PO TABS
ORAL_TABLET | ORAL | Status: AC
  Filled 2014-07-25: qty 1

## 2014-07-25 MED ORDER — POTASSIUM CHLORIDE IN NACL 20-0.45 MEQ/L-% IV SOLN
INTRAVENOUS | Status: DC
Start: 2014-07-25 — End: 2014-07-26
  Administered 2014-07-25: 21:00:00 via INTRAVENOUS
  Filled 2014-07-25 (×3): qty 1000

## 2014-07-25 MED ORDER — OXYCODONE-ACETAMINOPHEN 5-325 MG PO TABS: 1.0000 | ORAL_TABLET | ORAL | Status: DC | PRN

## 2014-07-25 MED ORDER — MIDAZOLAM HCL 5 MG/5ML IJ SOLN
INTRAMUSCULAR | Status: DC | PRN
  Administered 2014-07-25: 2 mg via INTRAVENOUS

## 2014-07-25 MED ORDER — MEPERIDINE HCL 25 MG/ML IJ SOLN: 6.2500 mg | INTRAMUSCULAR | Status: DC | PRN

## 2014-07-25 MED ORDER — PHENYLEPHRINE HCL 10 MG/ML IJ SOLN
INTRAMUSCULAR | Status: DC | PRN
  Administered 2014-07-25 (×3): 80 ug via INTRAVENOUS

## 2014-07-25 MED ORDER — METHOCARBAMOL 1000 MG/10ML IJ SOLN
500.0000 mg | Freq: Four times a day (QID) | INTRAVENOUS | Status: DC | PRN
  Filled 2014-07-25: qty 5

## 2014-07-25 MED ORDER — FENTANYL CITRATE 0.05 MG/ML IJ SOLN
INTRAMUSCULAR | Status: DC | PRN
  Administered 2014-07-25: 50 ug via INTRAVENOUS
  Administered 2014-07-25 (×2): 100 ug via INTRAVENOUS

## 2014-07-25 MED ORDER — PROPOFOL 10 MG/ML IV BOLUS
INTRAVENOUS | Status: AC
  Filled 2014-07-25: qty 20

## 2014-07-25 MED ORDER — EPHEDRINE SULFATE 50 MG/ML IJ SOLN
INTRAMUSCULAR | Status: DC | PRN
  Administered 2014-07-25 (×2): 10 mg via INTRAVENOUS

## 2014-07-25 MED ORDER — LIDOCAINE HCL (CARDIAC) 20 MG/ML IV SOLN
INTRAVENOUS | Status: DC | PRN
  Administered 2014-07-25: 100 mg via INTRAVENOUS

## 2014-07-25 MED ORDER — PHENYLEPHRINE 40 MCG/ML (10ML) SYRINGE FOR IV PUSH (FOR BLOOD PRESSURE SUPPORT)
PREFILLED_SYRINGE | INTRAVENOUS | Status: AC
  Filled 2014-07-25: qty 10

## 2014-07-25 MED ORDER — FENTANYL CITRATE 0.05 MG/ML IJ SOLN
INTRAMUSCULAR | Status: AC
  Filled 2014-07-25: qty 2

## 2014-07-25 MED ORDER — LIDOCAINE HCL (CARDIAC) 20 MG/ML IV SOLN
INTRAVENOUS | Status: AC
  Filled 2014-07-25: qty 5

## 2014-07-25 MED ORDER — GLYCOPYRROLATE 0.2 MG/ML IJ SOLN
INTRAMUSCULAR | Status: DC | PRN
  Administered 2014-07-25: 0.4 mg via INTRAVENOUS

## 2014-07-25 MED ORDER — MIDAZOLAM HCL 2 MG/2ML IJ SOLN
INTRAMUSCULAR | Status: AC
  Filled 2014-07-25: qty 2

## 2014-07-25 MED ORDER — GLYCOPYRROLATE 0.2 MG/ML IJ SOLN
INTRAMUSCULAR | Status: AC
  Filled 2014-07-25: qty 3

## 2014-07-25 MED ORDER — ROCURONIUM BROMIDE 100 MG/10ML IV SOLN
INTRAVENOUS | Status: DC | PRN
  Administered 2014-07-25: 40 mg via INTRAVENOUS

## 2014-07-25 MED ORDER — PROMETHAZINE HCL 25 MG/ML IJ SOLN: 6.2500 mg | INTRAMUSCULAR | Status: DC | PRN

## 2014-07-25 MED ORDER — FENTANYL CITRATE 0.05 MG/ML IJ SOLN
25.0000 ug | INTRAMUSCULAR | Status: DC | PRN
  Administered 2014-07-25 (×2): 50 ug via INTRAVENOUS

## 2014-07-25 MED ORDER — 0.9 % SODIUM CHLORIDE (POUR BTL) OPTIME
TOPICAL | Status: DC | PRN
  Administered 2014-07-25: 1000 mL

## 2014-07-25 MED ORDER — METHOCARBAMOL 500 MG PO TABS: 500.0000 mg | ORAL_TABLET | Freq: Four times a day (QID) | ORAL | Status: DC

## 2014-07-25 MED ORDER — NEOSTIGMINE METHYLSULFATE 10 MG/10ML IV SOLN
INTRAVENOUS | Status: AC
  Filled 2014-07-25: qty 1

## 2014-07-25 MED ORDER — LACTATED RINGERS IV SOLN
INTRAVENOUS | Status: DC
  Administered 2014-07-25: 09:00:00 via INTRAVENOUS

## 2014-07-25 MED ORDER — PROPOFOL 10 MG/ML IV BOLUS
INTRAVENOUS | Status: DC | PRN
  Administered 2014-07-25: 130 mg via INTRAVENOUS

## 2014-07-25 MED ORDER — METHOCARBAMOL 500 MG PO TABS: 500.0000 mg | ORAL_TABLET | Freq: Four times a day (QID) | ORAL | Status: DC | PRN

## 2014-07-25 MED ORDER — MENTHOL 3 MG MT LOZG: 1.0000 | LOZENGE | OROMUCOSAL | Status: DC | PRN

## 2014-07-25 MED ORDER — METHOCARBAMOL 500 MG PO TABS
ORAL_TABLET | ORAL | Status: AC
  Filled 2014-07-25: qty 1

## 2014-07-25 MED ORDER — OXYCODONE-ACETAMINOPHEN 5-325 MG PO TABS
1.0000 | ORAL_TABLET | ORAL | Status: DC | PRN
  Administered 2014-07-25: 1 via ORAL
  Administered 2014-07-25 – 2014-07-26 (×2): 2 via ORAL
  Filled 2014-07-25 (×2): qty 2

## 2014-07-25 MED ORDER — EPHEDRINE SULFATE 50 MG/ML IJ SOLN
INTRAMUSCULAR | Status: AC
  Filled 2014-07-25: qty 1

## 2014-07-25 MED ORDER — BUPIVACAINE HCL (PF) 0.25 % IJ SOLN
INTRAMUSCULAR | Status: DC | PRN
  Administered 2014-07-25: 10 mL

## 2014-07-25 MED ORDER — ROCURONIUM BROMIDE 50 MG/5ML IV SOLN
INTRAVENOUS | Status: AC
  Filled 2014-07-25: qty 1

## 2014-07-25 MED ORDER — NEOSTIGMINE METHYLSULFATE 10 MG/10ML IV SOLN
INTRAVENOUS | Status: DC | PRN
Start: 2014-07-25 — End: 2014-07-25
  Administered 2014-07-25: 3 mg via INTRAVENOUS

## 2014-07-25 MED ORDER — PHENOL 1.4 % MT LIQD
1.0000 | OROMUCOSAL | Status: DC | PRN
Start: 2014-07-25 — End: 2014-07-26

## 2014-07-25 MED ORDER — SODIUM CHLORIDE 0.9 % IJ SOLN
INTRAMUSCULAR | Status: AC
  Filled 2014-07-25: qty 10

## 2014-07-25 MED ORDER — FENTANYL CITRATE 0.05 MG/ML IJ SOLN
INTRAMUSCULAR | Status: AC
  Filled 2014-07-25: qty 5

## 2014-07-25 SURGICAL SUPPLY — 43 items
ADH SKN CLS APL DERMABOND .7 (GAUZE/BANDAGES/DRESSINGS) ×1
ADH SKN CLS LQ APL DERMABOND (GAUZE/BANDAGES/DRESSINGS) ×1
BUR ROUND FLUTED 4 SOFT TCH (BURR) IMPLANT
BUR ROUND FLUTED 4MM SOFT TCH (BURR)
CANISTER SUCTION 2500CC (MISCELLANEOUS) ×3 IMPLANT
COVER SURGICAL LIGHT HANDLE (MISCELLANEOUS) ×3 IMPLANT
DERMABOND ADHESIVE PROPEN (GAUZE/BANDAGES/DRESSINGS) ×2
DERMABOND ADVANCED (GAUZE/BANDAGES/DRESSINGS) ×2
DERMABOND ADVANCED .7 DNX12 (GAUZE/BANDAGES/DRESSINGS) ×1 IMPLANT
DERMABOND ADVANCED .7 DNX6 (GAUZE/BANDAGES/DRESSINGS) IMPLANT
DRAPE MICROSCOPE LEICA (MISCELLANEOUS) ×3 IMPLANT
DRSG MEPILEX BORDER 4X4 (GAUZE/BANDAGES/DRESSINGS) ×2 IMPLANT
DRSG MEPILEX BORDER 4X8 (GAUZE/BANDAGES/DRESSINGS) IMPLANT
DURAPREP 26ML APPLICATOR (WOUND CARE) ×3 IMPLANT
DURASEAL SPINE SEALANT 3ML (MISCELLANEOUS) IMPLANT
ELECT REM PT RETURN 9FT ADLT (ELECTROSURGICAL) ×3
ELECTRODE REM PT RTRN 9FT ADLT (ELECTROSURGICAL) ×1 IMPLANT
GAUZE SPONGE 4X4 12PLY STRL (GAUZE/BANDAGES/DRESSINGS) ×3 IMPLANT
GLOVE BIO SURGEON STRL SZ7.5 (GLOVE) ×4 IMPLANT
GLOVE BIOGEL PI IND STRL 8 (GLOVE) ×2 IMPLANT
GLOVE BIOGEL PI INDICATOR 8 (GLOVE) ×4
GLOVE ORTHO TXT STRL SZ7.5 (GLOVE) ×6 IMPLANT
GLOVE SURG SS PI 7.5 STRL IVOR (GLOVE) ×2 IMPLANT
GOWN SPEC L3 XXLG W/TWL (GOWN DISPOSABLE) ×2 IMPLANT
GOWN STRL REUS W/ TWL LRG LVL3 (GOWN DISPOSABLE) ×1 IMPLANT
GOWN STRL REUS W/TWL LRG LVL3 (GOWN DISPOSABLE) ×3
GOWN STRL REUS W/TWL XL LVL3 (GOWN DISPOSABLE) ×2 IMPLANT
KIT BASIN OR (CUSTOM PROCEDURE TRAY) ×3 IMPLANT
KIT ROOM TURNOVER OR (KITS) ×3 IMPLANT
NDL SPNL 18GX3.5 QUINCKE PK (NEEDLE) ×1 IMPLANT
NEEDLE SPNL 18GX3.5 QUINCKE PK (NEEDLE) ×3 IMPLANT
NS IRRIG 1000ML POUR BTL (IV SOLUTION) ×3 IMPLANT
PACK LAMINECTOMY ORTHO (CUSTOM PROCEDURE TRAY) ×3 IMPLANT
PAD ARMBOARD 7.5X6 YLW CONV (MISCELLANEOUS) ×6 IMPLANT
PATTIES SURGICAL .5 X.5 (GAUZE/BANDAGES/DRESSINGS) IMPLANT
PATTIES SURGICAL .75X.75 (GAUZE/BANDAGES/DRESSINGS) IMPLANT
SUT VIC AB 2-0 CT1 27 (SUTURE) ×3
SUT VIC AB 2-0 CT1 TAPERPNT 27 (SUTURE) ×1 IMPLANT
SUT VIC AB 3-0 X1 27 (SUTURE) IMPLANT
SYR 20ML ECCENTRIC (SYRINGE) ×2 IMPLANT
TOWEL OR 17X24 6PK STRL BLUE (TOWEL DISPOSABLE) ×3 IMPLANT
TOWEL OR 17X26 10 PK STRL BLUE (TOWEL DISPOSABLE) ×3 IMPLANT
WATER STERILE IRR 1000ML POUR (IV SOLUTION) ×1 IMPLANT

## 2014-07-25 NOTE — Brief Op Note (Signed)
07/25/2014  12:27 PM  PATIENT:  Terri [name]  69 y.o. 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

## 2014-07-25 NOTE — Transfer of Care (Signed)
Immediate Anesthesia Transfer of Care Note  Patient: Terri [name]  Procedure(s) Performed: Procedure(s): Right L4 Hemilaminectomy, Right L4-5 Laminotomy , Decompression, Microdisectomy (Right)  Patient Location: PACU  Anesthesia Type:General  Level of Consciousness: awake and alert   Airway & Oxygen Therapy: Patient Spontanous Breathing and Patient connected to nasal cannula oxygen  Post-op Assessment: Report given to RN and Post -op Vital signs reviewed and stable  Post vital signs: Reviewed and stable  Last Vitals:  Filed Vitals:   07/25/14 0811  BP: 167/80  Pulse: 76  Temp: 36.7 C  Resp: 18    Complications: No apparent anesthesia complications

## 2014-07-25 NOTE — H&P (Signed)
PATIENT: Terri [name], Terri [name]   MR#: [id number]  DOB: Apr 14, 1946   Visit Date: 06/23/2014     A 69 year old female whose daughter, Butch Penny, worked at [employer] for many years, is here for recurrent problems with claudication and lumbar spinal stenosis.  She has had ongoing problems with right leg pain, weakness, absent ankle jerk, disk degeneration, pain with standing more than 5 minutes, relief with sitting.  She states she does better when she is leaning on a grocery cart.  Increased pain with extension.  She gets relief from sitting down.  She has trouble standing and cooking a meal.  Pain radiates from her back into her right leg more than left and after a period of standing her legs get weak sometimes if she walks a little bit.  When she first gets up it can be helpful at least for a few minutes until her legs become extremely painful.     CURRENT MEDICATIONS:  She is not taking any current medications.     PAST MEDICAL/SURGICAL HISTORY:  She has had previous hysterectomy in 1969.  Shoulder surgery in the mid 90s.  Gallbladder 2006.   FAMILY HISTORY:  Positive for diabetes, heart disease, breast lump, prostate and colon cancer.     SOCIAL HISTORY:  She is married to her husband, [name].  She is retired.  Smokes 1/2 pack per day x52 years.  Does not drink.   REVIEW OF SYSTEMS:  Positive for acid reflux, arthritis, cataracts, bronchitis, migraines, and pneumonia.     PHYSICAL EXAMINATION:  Height:  5 feet 2 inches tall.  She has mildly increased BMI.  Extraocular movements intact.  Cervical range of motion is full.  Tenderness to palpation lumbosacral junction . Heart:  Regular rate and rhythm.  Abdomen:  Liver and spleen are not palpably enlarged.  Normal bowel sounds.  No pain with hip range of motion.  Knees reach full extension.  Negative popliteal compression test.  Hip flexion, collateral ligaments are stable.  No quad atrophy.  No hip flexion weakness.  SI joint testing is negative.  Negative FABER  test.  Distal pulses 2+.     RADIOGRAPHS:  LS spine x-rays demonstrate calcification in the abdominal aorta and common iliac narrowing at 5-1 with 2 mm retrolisthesis.  Narrowing at 4-5.  End plate changes at G3-8 anteriorly.  Mild lumbar curvature less than 10 degrees.  Old gallbladder clips.  Some facet degenerative changes.  Lumbar MRI from 06/20/2014 shows normal T12-L1.  Mild changes at L1-2, L2-3 and mild to moderate narrowing at L3-4.  Patient has severe stenosis at L4-5 with severe right lateral recess stenosis with compression of the L5 nerve root and lateral recess with severe central spinal stenosis and moderate right foraminal stenosis.  L5-S1 shows some degeneration.  No stenosis.  Patient does have severe foraminal stenosis at L5-S1.   ASSESSMENT:  Neurogenic claudication.      PLAN:  We discussed options and recommendations.  Plan would be hemilaminectomy on the right, laminotomy at L4 and on the right at L5 with microdiscectomy.  We discussed operative procedure, overnight stay in the hospital, potential for dural tear, progression or recurrence of stenosis, progression at other levels, need for lumbar fusion if she develops re-stenosis or progressive instability.  All questions answered.  She understands and requests we proceed.     For additional information please see handwritten notes, reports, orders and prescriptions in this chart.      Mark C. Lorin Mercy, M.D.  Auto-Authenticated by Thana Farr. Lorin Mercy, M.D.  MCY/ds/af DD: 06/23/2014  DT: 06/24/2014

## 2014-07-25 NOTE — Interval H&P Note (Signed)
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 After consideration of risks, benefits and other options for treatment, the patient has consented to  Procedure(s): Right L4 Hemilaminectomy, Right L4-5 Microdiscectomy (Right) as a surgical intervention .  The patient's history has been reviewed, patient examined, no change in status, stable for surgery.  I have reviewed the patient's chart and labs.  Questions were answered to the patient's satisfaction.     YATES,MARK C

## 2014-07-25 NOTE — Anesthesia Postprocedure Evaluation (Signed)
  Anesthesia Post-op Note  Patient: Terri [name]  Procedure(s) Performed: Procedure(s): Right L4 Hemilaminectomy, Right L4-5 Laminotomy , Decompression, Microdisectomy (Right)  Patient Location: PACU  Anesthesia Type:General  Level of Consciousness: awake  Airway and Oxygen Therapy: Patient Spontanous Breathing and Patient connected to nasal cannula oxygen  Post-op Pain: mild  Post-op Assessment: Post-op Vital signs reviewed, Patient's Cardiovascular Status Stable, Respiratory Function Stable, Patent Airway and No signs of Nausea or vomiting  Post-op Vital Signs: Reviewed and stable  Last Vitals:  Filed Vitals:   07/25/14 1245  BP:   Pulse: 84  Temp:   Resp: 19    Complications: No apparent anesthesia complications

## 2014-07-25 NOTE — Anesthesia Preprocedure Evaluation (Addendum)
Anesthesia Evaluation  Patient identified by MRN, date of birth, ID band Patient awake    Reviewed: Allergy & Precautions, NPO status , Patient's Chart, lab work & pertinent test results, reviewed documented beta blocker date and time   Airway Mallampati: II   Neck ROM: Full    Dental  (+) Edentulous Upper, Partial Lower, Dental Advisory Given   Pulmonary Current Smoker,  breath sounds clear to auscultation        Cardiovascular  STRESS 2014 normal EF 74%, normal ECHO 2014   Neuro/Psych    GI/Hepatic negative GI ROS, Neg liver ROS,   Endo/Other    Renal/GU      Musculoskeletal   Abdominal (+) + obese,   Peds  Hematology 13/40   Anesthesia Other Findings   Reproductive/Obstetrics                            Anesthesia Physical Anesthesia Plan  ASA: III  Anesthesia Plan: General   Post-op Pain Management:    Induction: Intravenous  Airway Management Planned:   Additional Equipment:   Intra-op Plan:   Post-operative Plan: Extubation in OR  Informed Consent: I have reviewed the patients History and Physical, chart, labs and discussed the procedure including the risks, benefits and alternatives for the proposed anesthesia with the patient or authorized representative who has indicated his/her understanding and acceptance.     Plan Discussed with:   Anesthesia Plan Comments:         Anesthesia Quick Evaluation

## 2014-07-26 ENCOUNTER — Encounter (HOSPITAL_COMMUNITY): Payer: Self-pay | Admitting: Orthopaedic Surgery

## 2014-07-26 LAB — BASIC METABOLIC PANEL
ANION GAP: 7 (ref 5–15)
BUN: 8 mg/dL (ref 6–23)
CALCIUM: 8.6 mg/dL (ref 8.4–10.5)
CO2: 24 mmol/L (ref 19–32)
Chloride: 106 mmol/L (ref 96–112)
Creatinine, Ser: 0.86 mg/dL (ref 0.50–1.10)
GFR calc Af Amer: 79 mL/min — ABNORMAL LOW (ref >90)
GFR calc non Af Amer: 68 mL/min — ABNORMAL LOW (ref >90)
Glucose, Bld: 101 mg/dL — ABNORMAL HIGH (ref 70–99)
POTASSIUM: 4.7 mmol/L (ref 3.5–5.1)
SODIUM: 137 mmol/L (ref 135–145)

## 2014-07-26 LAB — CBC
HCT: 35.1 % — ABNORMAL LOW (ref 36.0–46.0)
Hemoglobin: 11.3 g/dL — ABNORMAL LOW (ref 12.0–15.0)
MCH: 28.4 pg (ref 26.0–34.0)
MCHC: 32.2 g/dL (ref 30.0–36.0)
MCV: 88.2 fL (ref 78.0–100.0)
PLATELETS: 246 10*3/uL (ref 150–400)
RBC: 3.98 MIL/uL (ref 3.87–5.11)
RDW: 14.8 % (ref 11.5–15.5)
WBC: 11.2 10*3/uL — ABNORMAL HIGH (ref 4.0–10.5)

## 2014-07-26 NOTE — Progress Notes (Signed)
   Subjective:  Patient reports pain as moderate.  No events.  Objective:   VITALS:   Filed Vitals:   07/25/14 1445 07/25/14 2023 07/26/14 0058 07/26/14 0508  BP: 121/42 130/55 128/58 121/47  Pulse: 82 76 82 73  Temp: 97.8 F (36.6 C) 97.9 F (36.6 C) 97.8 F (36.6 C) 98.6 F (37 C)  TempSrc:      Resp: 16 16 16 16   Weight:      SpO2: 99% 98% 98% 93%    Neurologically intact ABD soft Neurovascular intact Sensation intact distally Intact pulses distally Dorsiflexion/Plantar flexion intact Incision: dressing C/D/I and no drainage No cellulitis present Compartment soft   Lab Results  Component Value Date   WBC 11.2* 07/26/2014   HGB 11.3* 07/26/2014   HCT 35.1* 07/26/2014   MCV 88.2 07/26/2014   PLT 246 07/26/2014     Assessment/Plan:  1 Day Post-Op   - Expected postop acute blood loss anemia - will monitor for symptoms - Up with PT/OT - DVT ppx - SCDs, ambulation - UAT - Pain control - Discharge planning - may dc home after PT  Marianna Payment 07/26/2014, 8:29 AM [phone]

## 2014-07-26 NOTE — Evaluation (Signed)
Physical Therapy Evaluation Patient Details Name: Terri [name] MRN: [id number] DOB: March 12, 1946 Today's Date: 07/26/2014   History of Present Illness  Patient is a 69 y/o female s/p Right L4 Hemilaminectomy, Right L4-5 Laminotomy , Decompression, Microdisectomy.   Clinical Impression  Patient presents with pain and balance deficits s/p above surgery. Balance improved with use of RW for safety. Discussed stair negotiation with pt and husband and pt demonstrated ability to negotiate steps with Min guard for safety. Education provided on back precautions. Pt will have 24/7 S from spouse at home. Pt would benefit from skilled PT to improve gait, balance and mobility so pt can maximize independence and return to PLOF.     Follow Up Recommendations Supervision/Assistance - 24 hour;No PT follow up    Equipment Recommendations  None recommended by PT    Recommendations for Other Services       Precautions / Restrictions Precautions Precautions: Fall;Back Precaution Comments: Reviewed back/lifting precautions.  Restrictions Weight Bearing Restrictions: No      Mobility  Bed Mobility               General bed mobility comments: Pt up in room ambulating to bathroom upon PT arrival. Discussed log roll technique for bed mobility.   Transfers Overall transfer level: Needs assistance Equipment used: None Transfers: Sit to/from Stand Sit to Stand: Min guard         General transfer comment: Min guard for safety. Stood from toilet x1. from chair x1.   Ambulation/Gait Ambulation/Gait assistance: Supervision Ambulation Distance (Feet): 150 Feet Assistive device: Rolling walker (2 wheeled) Gait Pattern/deviations: Step-through pattern;Decreased stride length   Gait velocity interpretation: Below normal speed for age/gender General Gait Details: Pt with slow and steady gait. Ambulated within room furniture walking- balance improved with use of RW.  Stairs Stairs: Yes Stairs  assistance: Min guard Stair Management: Step to pattern;Forwards;Two rails Number of Stairs: 2 General stair comments: Min guard for safety. Cues for technique.  Wheelchair Mobility    Modified Rankin (Stroke Patients Only)       Balance Overall balance assessment: Needs assistance Sitting-balance support: Feet supported;No upper extremity supported Sitting balance-Leahy Scale: Good Sitting balance - Comments: Able to reach outside BoS to grab toilet paper and perform pericare without difficulty.    Standing balance support: During functional activity Standing balance-Leahy Scale: Fair Standing balance comment: Tolerated dynamic standing with UE support leaning on sink, use of RW for ambulation for safety.                              Pertinent Vitals/Pain Pain Assessment: 0-10 Pain Score: 7  Pain Location: back at surgical site Pain Descriptors / Indicators: Sore Pain Intervention(s): Limited activity within patient's tolerance;Monitored during session;Repositioned    Home Living Family/patient expects to be discharged to:: Private residence Living Arrangements: Spouse/significant other Available Help at Discharge: Family;Available 24 hours/day Type of Home: House Home Access: Stairs to enter Entrance Stairs-Rails:  (2 columns to hold onto ) Entrance Stairs-Number of Steps: 2 Home Layout: One level Home Equipment: Walker - 2 wheels;Cane - single point;Shower seat      Prior Function Level of Independence: Independent               Hand Dominance        Extremity/Trunk Assessment   Upper Extremity Assessment: Defer to OT evaluation;Overall [facility] for tasks assessed  Lower Extremity Assessment: Overall WFL for tasks assessed         Communication   Communication: No difficulties  Cognition Arousal/Alertness: Awake/alert Behavior During Therapy: WFL for tasks assessed/performed Overall Cognitive Status: Within Functional Limits  for tasks assessed       Memory: Decreased recall of precautions              General Comments      Exercises        Assessment/Plan    PT Assessment Patient needs continued PT services  PT Diagnosis Difficulty walking;Acute pain   PT Problem List Pain;Decreased balance;Decreased mobility;Decreased knowledge of precautions;Decreased knowledge of use of DME  PT Treatment Interventions Balance training;Gait training;Stair training;Patient/family education;Functional mobility training;Therapeutic activities;Therapeutic exercise;DME instruction   PT Goals (Current goals can be found in the Care Plan section) Acute Rehab PT Goals Patient Stated Goal: to return home this AM PT Goal Formulation: With patient Time For Goal Achievement: 08/09/14 Potential to Achieve Goals: Good    Frequency Min 5X/week   Barriers to discharge        Co-evaluation               End of Session Equipment Utilized During Treatment: Gait belt Activity Tolerance: Patient limited by pain;Patient tolerated treatment well Patient left: in chair;with call bell/phone within reach;with family/visitor present Nurse Communication: Mobility status         Time: 1038-1050 PT Time Calculation (min) (ACUTE ONLY): 12 min   Charges:   PT Evaluation $Initial PT Evaluation Tier I: 1 Procedure     PT G CodesCandy Sledge A 2014/07/28, 10:56 AM  Candy Sledge, PT, DPT [phone]

## 2014-07-26 NOTE — Op Note (Signed)
Terri [name], Terri [name] NO.:  [id number]  MEDICAL RECORD NO.:  [id number]  LOCATION:  [id number]                        FACILITY:  [facility]  PHYSICIAN:  Mark C. Lorin Mercy, M.D.    DATE OF BIRTH:  1945/09/26  DATE OF PROCEDURE:  07/25/2014 DATE OF DISCHARGE:                              OPERATIVE REPORT   PREOPERATIVE DIAGNOSES:  Central stenosis, lateral recess stenosis, and foraminal stenosis, right L4-5.  POSTOPERATIVE DIAGNOSES:  Central stenosis, lateral recess stenosis, and foraminal stenosis, right L4-5.  PROCEDURE:  Right L4 hemilaminectomy, right L5 laminotomy, lateral recess decompression L4-5, microdiskectomy L4-5, and foraminotomy.  SURGEON:  Mark C. Lorin Mercy, M.D.  ASSISTANT:  Benjiman Core, PA-C.medically necessary and present for the entire procedure.   ANESTHESIA:  General plus Marcaine local.  ESTIMATED BLOOD LOSS:  Minimal.  COMPLICATIONS:  None.  INDICATIONS:  This patient has had progressive neurogenic claudication with primarily radicular pain, disk herniation centrally L4-5, and paracentral on the right with multifactorial stenosis with facet overhang on the right, ligamentum hypertrophy, and some migration of disk fragment above the level of the disk.  Standard prepping and draping was performed with the patient in prone position.  Careful padding area squared with towels.  Time-out procedure completed.  Betadine, Steri-Drape, laminectomy sheets, and drapes. Ancef was given prophylactically.  Incision was made.  Confirmation with x-ray with initially Penfield 4 and then 2 Kocher clamps were placed at the top and the bottom of the areas for resection for laminectomy.  Bur was used to thin the lamina of L4 and the top portion of L5 and laminotomy was performed on the right at the top portion of L5 and hemilaminectomy was performed on the right at L4 with encounter thick chunks of ligament.  There were overhanging spurs had to be removed off of the  4-5 facet.  Lateral recess was decompressed after removing chunks of ligament, disk protrusion, central and right was noted.  Anulus was incised after hemostasis with long bipolars.  Self-retaining retractor was used.  McCullough retractor with the extra long blade.  Nerve root was carefully retracted with the D'Errico.  Anulus was incised and chunks of disks were removed.  Epstein's was used in the midline delivering chunks of disk.  The spurs of the facet pushing ventrally were undercut with careful observation and protection of the L4 nerve root on the right.  There is a kink around underneath the pedicle.  The bone was removed out to the level of the pedicle.  Passes were made in the disk with micropituitary up and down regular pituitaries.  The foramina for the L5 nerve root on the right was enlarged.  The chunks of ligament were removed until with a hockey stick nerve root exited easily without areas of compression.  Copious irrigation, operative field was dry and closure with #1 Vicryl, 0-Vicryl, 2-0 Vicryl, subcutaneous tissue, subcuticular closure of the skin.  Dermabond postop dressing and transferred to the recovery room.  Instrument count and needle count was correct.     Mark C. Lorin Mercy, M.D.     MCY/MEDQ  D:  07/25/2014  T:  07/26/2014  Job:  [id number]

## 2014-07-29 NOTE — Discharge Summary (Signed)
Patient ID: Terri [name] MRN: [id number] DOB/AGE: 68-Jan-1947 68 y.o.  Admit date: 07/25/2014 Discharge date: 07/29/2014  Admission Diagnoses:  Active Problems:   S/P lumbar discectomy   Discharge Diagnoses:  Active Problems:   S/P lumbar discectomy  status post Procedure(s): Right L4 Hemilaminectomy, Right L4-5 Laminotomy , Decompression, Microdisectomy  Past Medical History  Diagnosis Date  . Smokers' cough   . Pneumonia 2014  . History of bronchitis 2014  . Stress headaches   . Arthritis   . Joint pain   . Joint swelling   . Chronic back pain     stenosis/HNP  . History of colon polyps     Surgeries: Procedure(s): Right L4 Hemilaminectomy, Right L4-5 Laminotomy , Decompression, Microdisectomy on 07/25/2014   Consultants:    Discharged Condition: Improved  Hospital Course: Terri [name] is an 69 y.o. female who was admitted 07/25/2014 for operative treatment of lumbar ddd/hnp. . Patient failed conservative treatments (please see the history and physical for the specifics) and had severe unremitting pain that affects sleep, daily activities and work/hobbies. After pre-op clearance, the patient was taken to the operating room on 07/25/2014 and underwent  Procedure(s): Right L4 Hemilaminectomy, Right L4-5 Laminotomy , Decompression, Microdisectomy.    Patient was given perioperative antibiotics:  Anti-infectives    Start     Dose/Rate Route Frequency Ordered Stop   07/25/14 0600  ceFAZolin (ANCEF) IVPB 2 g/50 mL premix     2 g 100 mL/hr over 30 Minutes Intravenous On call to O.R. 07/24/14 1354 07/25/14 1030       Patient was given sequential compression devices and early ambulation to prevent DVT.   Patient benefited maximally from hospital stay and there were no complications. At the time of discharge, the patient was urinating/moving their bowels without difficulty, tolerating a regular diet, pain is controlled with oral pain medications and they have been cleared  by PT/OT.   Recent vital signs: No data found.    Recent laboratory studies: No results for input(s): WBC, HGB, HCT, PLT, NA, K, CL, CO2, BUN, CREATININE, GLUCOSE, INR, CALCIUM in the last 72 hours.  Invalid input(s): PT, 2   Discharge Medications:     Medication List    STOP taking these medications        acetaminophen 500 MG tablet  Commonly known as:  TYLENOL     aspirin EC 81 MG tablet     BC HEADACHE POWDER PO     naproxen sodium 220 MG tablet  Commonly known as:  ANAPROX      TAKE these medications        atorvastatin 40 MG tablet  Commonly known as:  LIPITOR  Take 1 tablet (40 mg total) by mouth daily.     methocarbamol 500 MG tablet  Commonly known as:  ROBAXIN  Take 1 tablet (500 mg total) by mouth 4 (four) times daily.     methocarbamol 500 MG tablet  Commonly known as:  ROBAXIN  Take 1 tablet (500 mg total) by mouth every 6 (six) hours as needed for muscle spasms.     oxyCODONE-acetaminophen 5-325 MG per tablet  Commonly known as:  ROXICET  Take 1 tablet by mouth every 4 (four) hours as needed for severe pain.        Diagnostic Studies: Dg Chest 2 View  07/15/2014   CLINICAL DATA:  Preoperative exam prior to lumbar surgery, history of bronchitis, chronic tobacco use  EXAM: CHEST  2 VIEW  COMPARISON:  PA and lateral chest of December 14, 2012  FINDINGS: The lungs are adequately inflated. There are increased lung markings in the lingula with partial obscuration of the left heart border. Summer but slightly less conspicuous findings are noted medially in the right middle lobe. These are new since the study of July 2014. The heart and pulmonary vascularity are normal. The mediastinum is normal in width. There is no pleural effusion. The bony thorax is unremarkable.  IMPRESSION: Mildly increased interstitial markings in the lingula and to a lesser extent in the right middle lobe consistent with subsegmental atelectasis. There is no alveolar pneumonia nor other  active cardiopulmonary disease.   Electronically Signed   By: David  Martinique   On: 07/15/2014 13:32   Dg Lumbar Spine 2-3 Views  07/25/2014   CLINICAL DATA:  Lumbar spine surgery.  EXAM: LUMBAR SPINE - 2-3 VIEW  COMPARISON:  MRI 06/20/2014.  FINDINGS: Metallic marker noted along the posterior aspect of L4. Diffuse degenerative change with good anatomic alignment. Aortoiliac atherosclerotic vascular disease.  IMPRESSION: Metallic marker noted along the posterior aspect of L4.   Electronically Signed   By: Marcello Moores  Register   On: 07/25/2014 12:41        Discharge Instructions    Call MD / Call 911    Complete by:  As directed   If you experience chest pain or shortness of breath, CALL 911 and be transported to the hospital emergency room.  If you develope a fever above 101 F, pus (white drainage) or increased drainage or redness at the wound, or calf pain, call your surgeon's office.     Call MD / Call 911    Complete by:  As directed   If you experience chest pain or shortness of breath, CALL 911 and be transported to the hospital emergency room.  If you develope a fever above 101.5 F, pus (white drainage) or increased drainage or redness at the wound, or calf pain, call your surgeon's office.     Constipation Prevention    Complete by:  As directed   Drink plenty of fluids.  Prune juice may be helpful.  You may use a stool softener, such as Colace (over the counter) 100 mg twice a day.  Use MiraLax (over the counter) for constipation as needed.     Constipation Prevention    Complete by:  As directed   Drink plenty of fluids.  Prune juice may be helpful.  You may use a stool softener, such as Colace (over the counter) 100 mg twice a day.  Use MiraLax (over the counter) for constipation as needed.     Diet - low sodium heart healthy    Complete by:  As directed      Diet - low sodium heart healthy    Complete by:  As directed      Diet general    Complete by:  As directed      Discharge  instructions    Complete by:  As directed   Ok to shower 5 days postop.  Do not apply any creams or ointments to incision.  Do not remove steri-strips.  Can use 4x4 gauze and tape for dressing changes.  No aggressive activity.  No bending, squatting or prolonged sitting.  Mostly be in reclined position or lying down.     Driving restrictions    Complete by:  As directed   No driving until further notice.     Driving restrictions  Complete by:  As directed   No driving while taking narcotic pain meds.     Increase activity slowly as tolerated    Complete by:  As directed      Increase activity slowly as tolerated    Complete by:  As directed      Lifting restrictions    Complete by:  As directed   No lifting until further notice.     TED hose    Complete by:  As directed   Use stockings (TED hose) for 6 weeks on both leg(s).  You may remove them at night for sleeping.           Follow-up Information    Follow up with Marybelle Killings, MD.   Specialty:  Orthopedic Surgery   Why:  need return office visit one week.    Contact information:   [place] [address] [phone]       Discharge Plan:  discharge to home  Disposition:     Signed: Lanae Crumbly  07/29/2014, 3:26 PM

## 2014-10-22 DIAGNOSIS — H43393 Other vitreous opacities, bilateral: Secondary | ICD-10-CM | POA: Diagnosis not present

## 2014-11-14 DIAGNOSIS — H1013 Acute atopic conjunctivitis, bilateral: Secondary | ICD-10-CM | POA: Diagnosis not present

## 2014-11-14 DIAGNOSIS — B009 Herpesviral infection, unspecified: Secondary | ICD-10-CM | POA: Diagnosis not present

## 2014-11-28 DIAGNOSIS — M545 Low back pain: Secondary | ICD-10-CM | POA: Diagnosis not present

## 2015-01-31 DIAGNOSIS — E669 Obesity, unspecified: Secondary | ICD-10-CM | POA: Diagnosis present

## 2015-01-31 DIAGNOSIS — I2109 ST elevation (STEMI) myocardial infarction involving other coronary artery of anterior wall: Secondary | ICD-10-CM | POA: Diagnosis not present

## 2015-01-31 DIAGNOSIS — I2121 ST elevation (STEMI) myocardial infarction involving left circumflex coronary artery: Secondary | ICD-10-CM | POA: Diagnosis not present

## 2015-01-31 DIAGNOSIS — E785 Hyperlipidemia, unspecified: Secondary | ICD-10-CM | POA: Diagnosis present

## 2015-01-31 DIAGNOSIS — Z6835 Body mass index (BMI) 35.0-35.9, adult: Secondary | ICD-10-CM | POA: Diagnosis not present

## 2015-01-31 DIAGNOSIS — I10 Essential (primary) hypertension: Secondary | ICD-10-CM | POA: Diagnosis not present

## 2015-01-31 DIAGNOSIS — R079 Chest pain, unspecified: Secondary | ICD-10-CM | POA: Diagnosis not present

## 2015-01-31 DIAGNOSIS — F17210 Nicotine dependence, cigarettes, uncomplicated: Secondary | ICD-10-CM | POA: Diagnosis not present

## 2015-01-31 DIAGNOSIS — I2583 Coronary atherosclerosis due to lipid rich plaque: Secondary | ICD-10-CM | POA: Diagnosis present

## 2015-01-31 DIAGNOSIS — I214 Non-ST elevation (NSTEMI) myocardial infarction: Secondary | ICD-10-CM | POA: Diagnosis not present

## 2015-01-31 DIAGNOSIS — I2584 Coronary atherosclerosis due to calcified coronary lesion: Secondary | ICD-10-CM | POA: Diagnosis present

## 2015-01-31 DIAGNOSIS — I2510 Atherosclerotic heart disease of native coronary artery without angina pectoris: Secondary | ICD-10-CM | POA: Diagnosis present

## 2015-02-18 ENCOUNTER — Encounter: Payer: Self-pay | Admitting: Cardiovascular Disease

## 2015-02-21 ENCOUNTER — Telehealth: Payer: Self-pay | Admitting: Physician Assistant

## 2015-02-21 NOTE — Telephone Encounter (Signed)
Patient recently admitted to hospital at [facility] with an MI. She was there 3 days and had 3 stents placed. She has a FU with Dr. Quay Burow next month. She called today with episodic diaphoresis occuring with folding clothes at home. She denies chest pain, dyspnea, syncope. She says this went on for 15 mins. She feels fine now. Her husband is concerned b/c this is how she looked at the time of her MI. I advised the patient to go to the ED for evaluation today. They agree with this plan. They would like a sooner appt. Will arrange a FU visit this week at our office. Richardson Dopp, PA-C   02/21/2015 2:25 PM

## 2015-02-24 ENCOUNTER — Ambulatory Visit: Payer: Medicare Other | Admitting: Cardiovascular Disease

## 2015-02-26 ENCOUNTER — Encounter: Payer: Self-pay | Admitting: Physician Assistant

## 2015-02-26 NOTE — Progress Notes (Signed)
Cardiology Office Note Date:  02/27/2015  Patient ID:  Terri, [name] 10/10/1945, MRN [id number] PCP:  No PCP Per Patient  Cardiologist:  Dr. Gwenlyn Found   Chief Complaint: f/u MI  History of Present Illness: Terri [name] is a 69 y.o. female with history of tobacco abuse, palpitations, recently diagnosed CAD, hyperlipidemia who presents for follow-up. She was last seen by Dr. Gwenlyn Found in 2014 for risk stratification at which time stress testing was normal. Echo 8/14 showed normal LV function, mild AI, trivial pericardial effusion. she was started on statin. In the meantime she was hospitalized recently for anterior STEMI at [facility] in 01/2015 and underwent overlapping DES to prox LAD and DES to prox RCA, EF not mentioned in either diagnostic or PCI note. CXR no active disease. Labs notable for WBC 9.7, Hgb 12.8, Plt 260, Na 141, K 3.9, Cl 108, CO2 22, glucose 127, BUN 12, Cr 0.74, AST 26, ALT 23. The patient reports she had an echocardiogram prior to discharge showing EF 25%. We do not have that report.  She comes in today overall feeling better. About a week ago she called in for an episode of recurrent diaphoresis that lasted about 10 minutes but no other symptoms - no CP, SOB, syncope, lightheadedness. This resolved spontaneously. She recognized that this was similar to recent MI except with her MI she had a multitude of other symptoms. It was recommended she proceed to the ER but she did not go because she felt fine otherwise. She has not had any recurrent episodes since that time. She feels deconditioned with some DOE that has been persistent since before the MI and would like to get back into playing golf. She is interested in pursuing cardiac rehab.   Past Medical History  Diagnosis Date  . Smokers' cough   . Pneumonia 2014  . History of bronchitis 2014  . Stress headaches   . Arthritis   . Joint pain   . Joint swelling   . Chronic back pain     stenosis/HNP  . History of colon  polyps   . Tobacco abuse   . Hyperlipidemia   . CAD (coronary artery disease)     a. Anterior STEMI 01/2015 s/p overlapping DES x2 to prox LAD, DES to prox RCA in [facility].  . Aortic insufficiency     a. Mild AI by echo 2014.  . Ischemic cardiomyopathy     Past Surgical History  Procedure Laterality Date  . Tonsillectomy      as a child  . Abdominal hysterectomy      at age 60  . Shoulder surgery Right   . Cholecystectomy    . Colonscopy    . Esophagogastroduodenoscopy    . Ercp    . Cataract surgery Bilateral   . Lumbar laminectomy Right 07/25/2014    Procedure: Right L4 Hemilaminectomy, Right L4-5 Laminotomy , Decompression, Microdisectomy;  Surgeon: Marybelle Killings, MD;  Location: [facility];  Service: Orthopedics;  Laterality: Right;    Current Outpatient Prescriptions  Medication Sig Dispense Refill  . atorvastatin (LIPITOR) 80 MG tablet Take 1 tablet (80 mg total) by mouth daily. 30 tablet 6  . carvedilol (COREG) 3.125 MG tablet Take 3.125 mg by mouth 2 (two) times daily.  11  . clopidogrel (PLAVIX) 75 MG tablet Take 75 mg by mouth daily.  11  . [facility] ASPIRIN 81 MG EC tablet Take 81 mg by mouth daily.  0  . lisinopril (  PRINIVIL,ZESTRIL) 2.5 MG tablet Take 2.5 mg by mouth daily.  11   No current facility-administered medications for this visit.    Allergies:   Erythromycin   Social History:  The patient  reports that she has been smoking Cigarettes.  She has a 25 pack-year smoking history. She has never used smokeless tobacco. She reports that she does not drink alcohol or use illicit drugs.   Family History:  The patient's family history includes CAD in an other family member; Heart disease in her father and mother; Heart failure in her father and mother.  ROS:  Please see the history of present illness.  All other systems are reviewed and otherwise negative.   PHYSICAL EXAM:  VS:  BP 102/60 mmHg  Pulse 71  Ht 5' 2.5" (1.588 m)  Wt 190 lb (86.183 kg)  BMI  34.18 kg/m2  SpO2 98% BMI: Body mass index is 34.18 kg/(m^2). Well nourished, well developed obese WF, in no acute distress HEENT: normocephalic, atraumatic Neck: no JVD, carotid bruits or masses Cardiac:  normal S1, S2; RRR; no murmurs, rubs, or gallops Lungs:  clear to auscultation bilaterally, no wheezing, rhonchi or rales Abd: soft, nontender, no hepatomegaly, + BS MS: no deformity or atrophy Ext: no edema, right radial cath site without hematoma, ecchymosis. Good pulse Skin: warm and dry, no rash Neuro:  moves all extremities spontaneously, no focal abnormalities noted, follows commands Psych: euthymic mood, full affect  EKG:  Done today shows NSR 71bpm, septal infarct age undetermined with deep TWI V1-V2, less pronounced but present in I, avL with slight plateauing of ST segment in V1, V2, I, avL. I do not have any EKGs from her recent admission in [facility].  Recent Labs: 07/25/2014: ALT 14 07/26/2014: BUN 8; Creatinine, Ser 0.86; Hemoglobin 11.3*; Platelets 246; Potassium 4.7; Sodium 137  No results found for requested labs within last 365 days.   CrCl cannot be calculated (Patient has no serum creatinine result on file.).   Wt Readings from Last 3 Encounters:  02/27/15 190 lb (86.183 kg)  07/25/14 197 lb (89.359 kg)  07/15/14 197 lb 6.4 oz (89.54 kg)     Other studies reviewed: Additional studies/records reviewed today include: summarized above  ASSESSMENT AND PLAN:  1. CAD s/p recent MI/PCI as above - overall doing well post-PCI. Discussed issue of diaphoresis last week with MD and at this time we will continue conservative observation of symptoms since she did not have any recurrent angina at that time, and has had no episodes since then. Continue ASA, Plavix, BB, statin. Will check repeat labs since all of her recent meds were new including ACEI. Will also check P2Y12. It looks like they put her on Effient at [facility] but she is on Plavix now. Will refer to cardiac  rehab. I told her if her symptoms recur like last week it would be best if she proceed to the hospital so we can fully re-evaluate her in real-time. 2. Ischemic cardiomyopathy - discussed issue of LifeVest with Dr. Ron Parker (DOD) who recommended we obtain updated echocardiogram to reassess EF since it has been almost a month since PCI. If LV function is still down the patient is amenable to getting set up for LifeVest. If EF is down will also need reassessment of EF 3 months post-PCI to determine if she will require ICD. Discussed salt/fluid restriction and daily weights. BP prohibits further med titration. 3. Hyperlipidemia - check lipids/LFTs in 6-8 weeks given statin titration. 4. Tobacco abuse -  she is very motivated to quit but feels she needs help. She wishes to try Nicorette-type product. She will be going to the pharmacy to look at her different options - she will let us know what she chooses and we can write a prescription for this if needed. 5. Hyperglycemia - check A1C with labs today.  Disposition: F/u with Dr. Gwenlyn Found or APP in 2 months.  Current medicines are reviewed at length with the patient today.  The patient did not have any concerns regarding medicines.  Raechel Ache PA-C 02/27/2015 9:08 AM     [facility] [address] [phone] (office)  [phone] (fax)

## 2015-02-27 ENCOUNTER — Ambulatory Visit (INDEPENDENT_AMBULATORY_CARE_PROVIDER_SITE_OTHER): Payer: Medicare Other | Admitting: Physician Assistant

## 2015-02-27 ENCOUNTER — Encounter: Payer: Self-pay | Admitting: Physician Assistant

## 2015-02-27 VITALS — BP 102/60 | HR 71 | Ht 62.5 in | Wt 190.0 lb

## 2015-02-27 DIAGNOSIS — E785 Hyperlipidemia, unspecified: Secondary | ICD-10-CM | POA: Diagnosis not present

## 2015-02-27 DIAGNOSIS — I255 Ischemic cardiomyopathy: Secondary | ICD-10-CM

## 2015-02-27 DIAGNOSIS — Z720 Tobacco use: Secondary | ICD-10-CM | POA: Diagnosis not present

## 2015-02-27 DIAGNOSIS — R739 Hyperglycemia, unspecified: Secondary | ICD-10-CM

## 2015-02-27 DIAGNOSIS — Z9861 Coronary angioplasty status: Secondary | ICD-10-CM

## 2015-02-27 DIAGNOSIS — I2510 Atherosclerotic heart disease of native coronary artery without angina pectoris: Secondary | ICD-10-CM

## 2015-02-27 DIAGNOSIS — I2109 ST elevation (STEMI) myocardial infarction involving other coronary artery of anterior wall: Secondary | ICD-10-CM | POA: Diagnosis not present

## 2015-02-27 LAB — CBC WITH DIFFERENTIAL/PLATELET
BASOS ABS: 0 10*3/uL (ref 0.0–0.1)
Basophils Relative: 0.4 % (ref 0.0–3.0)
EOS PCT: 2 % (ref 0.0–5.0)
Eosinophils Absolute: 0.2 10*3/uL (ref 0.0–0.7)
HCT: 38.7 % (ref 36.0–46.0)
HEMOGLOBIN: 12.9 g/dL (ref 12.0–15.0)
LYMPHS ABS: 3.2 10*3/uL (ref 0.7–4.0)
Lymphocytes Relative: 29.9 % (ref 12.0–46.0)
MCHC: 33.4 g/dL (ref 30.0–36.0)
MCV: 85.8 fl (ref 78.0–100.0)
MONO ABS: 0.6 10*3/uL (ref 0.1–1.0)
Monocytes Relative: 5.7 % (ref 3.0–12.0)
NEUTROS PCT: 62 % (ref 43.0–77.0)
Neutro Abs: 6.6 10*3/uL (ref 1.4–7.7)
Platelets: 310 10*3/uL (ref 150.0–400.0)
RBC: 4.51 Mil/uL (ref 3.87–5.11)
RDW: 15.1 % (ref 11.5–15.5)
WBC: 10.6 10*3/uL — AB (ref 4.0–10.5)

## 2015-02-27 LAB — BASIC METABOLIC PANEL
BUN: 14 mg/dL (ref 6–23)
CO2: 25 meq/L (ref 19–32)
CREATININE: 0.95 mg/dL (ref 0.40–1.20)
Calcium: 9.7 mg/dL (ref 8.4–10.5)
Chloride: 105 mEq/L (ref 96–112)
GFR: 62.1 mL/min (ref >60.00)
Glucose, Bld: 113 mg/dL — ABNORMAL HIGH (ref 70–99)
Potassium: 3.8 mEq/L (ref 3.5–5.1)
Sodium: 138 mEq/L (ref 135–145)

## 2015-02-27 LAB — MAGNESIUM: MAGNESIUM: 2.2 mg/dL (ref 1.5–2.5)

## 2015-02-27 LAB — HEMOGLOBIN A1C: HEMOGLOBIN A1C: 6 % (ref 4.6–6.5)

## 2015-02-27 MED ORDER — ATORVASTATIN CALCIUM 80 MG PO TABS: 40.0000 mg | ORAL_TABLET | Freq: Every day | ORAL | Status: DC

## 2015-02-27 MED ORDER — ATORVASTATIN CALCIUM 80 MG PO TABS: 80.0000 mg | ORAL_TABLET | Freq: Every day | ORAL | Status: DC

## 2015-02-27 NOTE — Patient Instructions (Addendum)
Medication Instructions:  Your physician recommends that you continue on your current medications as directed. Please refer to the Current Medication list given to you today.   Labwork: TODAY:  BMET                 MAGNESIUM                 CBC W/DIFF                  A1C             IN 6-8 WEEKS:  LIPIDS & LFT'S  Testing/Procedures: Your physician has requested that you have an echocardiogram. Echocardiography is a painless test that uses sound waves to create images of your heart. It provides your doctor with information about the size and shape of your heart and how well your heart's chambers and valves are working. This procedure takes approximately one hour. There are no restrictions for this procedure.   Follow-Up: Your physician recommends that you schedule a follow-up appointment in [address] [place] OR DAYNA DUNN, PA-C   Any Other Special Instructions Will Be Listed Below (If Applicable).  You have been referred for Cardiac Rehab.  You will be contacted by their office to set up the initial appointment.    Echocardiogram An echocardiogram, or echocardiography, uses sound waves (ultrasound) to produce an image of your heart. The echocardiogram is simple, painless, obtained within a short period of time, and offers valuable information to your health care provider. The images from an echocardiogram can provide information such as:  Evidence of coronary artery disease (CAD).  Heart size.  Heart muscle function.  Heart valve function.  Aneurysm detection.  Evidence of a past heart attack.  Fluid buildup around the heart.  Heart muscle thickening.  Assess heart valve function. LET [facility] CARE PROVIDER KNOW ABOUT:  Any allergies you have.  All medicines you are taking, including vitamins, herbs, eye drops, creams, and over-the-counter medicines.  Previous problems you or members of your family have had with the use of anesthetics.  Any blood  disorders you have.  Previous surgeries you have had.  Medical conditions you have.  Possibility of pregnancy, if this applies. BEFORE THE PROCEDURE  No special preparation is needed. Eat and drink normally.  PROCEDURE   In order to produce an image of your heart, gel will be applied to your chest and a wand-like tool (transducer) will be moved over your chest. The gel will help transmit the sound waves from the transducer. The sound waves will harmlessly bounce off your heart to allow the heart images to be captured in real-time motion. These images will then be recorded.  You may need an IV to receive a medicine that improves the quality of the pictures. AFTER THE PROCEDURE You may return to your normal schedule including diet, activities, and medicines, unless your health care provider tells you otherwise. Document Released: 05/20/2000 Document Revised: 10/07/2013 Document Reviewed: 01/28/2013 [facility] Patient Information 2015 [place], [place]. This information is not intended to replace advice given to you by your health care provider. Make sure you discuss any questions you have with your health care provider.

## 2015-03-02 ENCOUNTER — Telehealth: Payer: Self-pay

## 2015-03-02 DIAGNOSIS — Z79899 Other long term (current) drug therapy: Secondary | ICD-10-CM

## 2015-03-02 NOTE — Telephone Encounter (Signed)
Checked on patient's lab platelet inhibition p2y12. Patient had labs done on 02/27/2015. The hospital is the only one that can draw P2Y12. Called patient and informed her to please go to hospital tomorrow to have lab work done after her echocardiogram. Patient verbalized understanding. Will forward to [facility] PA, so she is aware.

## 2015-03-03 ENCOUNTER — Other Ambulatory Visit: Payer: Self-pay

## 2015-03-03 ENCOUNTER — Ambulatory Visit (HOSPITAL_COMMUNITY): Payer: Medicare Other | Attending: Physician Assistant

## 2015-03-03 DIAGNOSIS — I351 Nonrheumatic aortic (valve) insufficiency: Secondary | ICD-10-CM | POA: Diagnosis not present

## 2015-03-03 DIAGNOSIS — I255 Ischemic cardiomyopathy: Secondary | ICD-10-CM | POA: Insufficient documentation

## 2015-03-03 DIAGNOSIS — E785 Hyperlipidemia, unspecified: Secondary | ICD-10-CM | POA: Insufficient documentation

## 2015-03-03 DIAGNOSIS — F17200 Nicotine dependence, unspecified, uncomplicated: Secondary | ICD-10-CM | POA: Diagnosis not present

## 2015-03-03 DIAGNOSIS — R29898 Other symptoms and signs involving the musculoskeletal system: Secondary | ICD-10-CM | POA: Diagnosis not present

## 2015-03-03 DIAGNOSIS — Z8249 Family history of ischemic heart disease and other diseases of the circulatory system: Secondary | ICD-10-CM | POA: Insufficient documentation

## 2015-03-03 DIAGNOSIS — I371 Nonrheumatic pulmonary valve insufficiency: Secondary | ICD-10-CM | POA: Insufficient documentation

## 2015-03-03 DIAGNOSIS — I517 Cardiomegaly: Secondary | ICD-10-CM | POA: Diagnosis not present

## 2015-03-03 DIAGNOSIS — I340 Nonrheumatic mitral (valve) insufficiency: Secondary | ICD-10-CM | POA: Diagnosis not present

## 2015-03-04 ENCOUNTER — Telehealth: Payer: Self-pay

## 2015-03-04 ENCOUNTER — Other Ambulatory Visit: Payer: Self-pay | Admitting: *Deleted

## 2015-03-04 DIAGNOSIS — Z79899 Other long term (current) drug therapy: Secondary | ICD-10-CM

## 2015-03-04 NOTE — Telephone Encounter (Signed)
Called patient with echo results. Patient was unable to get her platelet inhibition P2Y12 lab work yesterday. Called and talked to Maudie Mercury at [facility]. Their lab is unable to see our order through the computer. Will fax an order to them.   Called and confirmed with lab that fax was received. Lab stated that patient could come in any time, that an appointment is not necessary.  Called patient informed her that she could go over to have lab work done at [facility] at her convenience. Patient verbalized understanding and will go tomorrow to have lab work done.

## 2015-03-05 ENCOUNTER — Other Ambulatory Visit (HOSPITAL_COMMUNITY)
Admission: RE | Admit: 2015-03-05 | Discharge: 2015-03-05 | Disposition: A | Discharge status: Home / Self Care | Payer: Medicare Other | Source: Other Acute Inpatient Hospital | Attending: Internal Medicine | Admitting: Internal Medicine

## 2015-03-05 ENCOUNTER — Telehealth: Payer: Self-pay | Admitting: Physician Assistant

## 2015-03-05 DIAGNOSIS — F17200 Nicotine dependence, unspecified, uncomplicated: Secondary | ICD-10-CM

## 2015-03-05 DIAGNOSIS — Z79899 Other long term (current) drug therapy: Secondary | ICD-10-CM | POA: Insufficient documentation

## 2015-03-05 LAB — PLATELET INHIBITION P2Y12: PLATELET FUNCTION P2Y12: 28 [PRU] — AB (ref 194–418)

## 2015-03-05 MED ORDER — NICOTINE 21 MG/24HR TD PT24: 21.0000 mg | MEDICATED_PATCH | Freq: Every day | TRANSDERMAL | Status: DC

## 2015-03-05 NOTE — Telephone Encounter (Signed)
Pt went to her pharmacy as Melina Copa PA recommended for her to do. Pt  spoke with the pharmacist to see what he recommend for  Her to stop smoking. Pt states the pharmacist recommended to try Nicotine patches . A prescription for nicotine patches 21 mg once a day send to [facility] dispense 28 patches and 0 refill. Pt verbalized understanding.

## 2015-03-05 NOTE — Telephone Encounter (Signed)
New message      Pt saw Dayna last week.  Patient is calling to give info to [place] regarding smoking aids.  She talked to the pharmacist and now she is calling us to update [place].  Please call

## 2015-03-11 ENCOUNTER — Telehealth: Payer: Self-pay | Admitting: *Deleted

## 2015-03-11 ENCOUNTER — Ambulatory Visit: Payer: Medicare Other | Admitting: Cardiovascular Disease

## 2015-03-11 ENCOUNTER — Other Ambulatory Visit: Payer: Self-pay | Admitting: *Deleted

## 2015-03-11 MED ORDER — NICOTINE 21 MG/24HR TD PT24: MEDICATED_PATCH | TRANSDERMAL | Status: DC

## 2015-03-11 NOTE — Telephone Encounter (Signed)
A prescription for  Nicotine patch Verbal order called to pharmacist Sam at  [facility] in [place]: 21 mg one daily for 6 weeks (dispense #42), then 14 mg patch one daily for 2 weeks (dispense 14), then 7 mg patch one daily for 2 weeks (dispense #14). Remove old patch before applying new one. Previous nicotine patch order was D/C. Sam the Pharmacist in [place] states that pt has not peak-up the original order because her insurance would not pay . Sam suggested that pt may wants to get the medication from the [place].   Left pt a message to call back regarding the change of nicotine patch prescription .

## 2015-03-13 NOTE — Telephone Encounter (Signed)
Lm both on home phone and mobile to call back regarding Rx.

## 2015-03-13 NOTE — Telephone Encounter (Signed)
Patient calling back. Informed her of pharmacist Sam's advice and try the [place]. Patient verbalized understanding.

## 2015-04-09 ENCOUNTER — Telehealth (HOSPITAL_COMMUNITY): Payer: Self-pay | Admitting: *Deleted

## 2015-04-09 NOTE — Telephone Encounter (Signed)
Received signed MD order.  Pt called and message left to please contact Cardiac Rehab.  Messag provided contact information. Cherre Huger, BSN

## 2015-04-20 ENCOUNTER — Other Ambulatory Visit (INDEPENDENT_AMBULATORY_CARE_PROVIDER_SITE_OTHER): Payer: Medicare Other | Admitting: *Deleted

## 2015-04-20 DIAGNOSIS — E785 Hyperlipidemia, unspecified: Secondary | ICD-10-CM

## 2015-04-20 LAB — LIPID PANEL
CHOL/HDL RATIO: 3.9 ratio (ref <=5.0)
Cholesterol: 106 mg/dL — ABNORMAL LOW (ref 125–200)
HDL: 27 mg/dL — AB (ref >=46)
LDL Cholesterol: 35 mg/dL (ref <130)
TRIGLYCERIDES: 219 mg/dL — AB (ref <150)
VLDL: 44 mg/dL — ABNORMAL HIGH (ref <30)

## 2015-04-20 LAB — HEPATIC FUNCTION PANEL
ALBUMIN: 3.9 g/dL (ref 3.6–5.1)
ALK PHOS: 131 U/L — AB (ref 33–130)
ALT: 22 U/L (ref 6–29)
AST: 14 U/L (ref 10–35)
Bilirubin, Direct: 0.1 mg/dL (ref <=0.2)
Indirect Bilirubin: 0.2 mg/dL (ref 0.2–1.2)
TOTAL PROTEIN: 6.7 g/dL (ref 6.1–8.1)
Total Bilirubin: 0.3 mg/dL (ref 0.2–1.2)

## 2015-04-28 ENCOUNTER — Encounter: Payer: Self-pay | Admitting: Cardiovascular Disease

## 2015-04-28 ENCOUNTER — Ambulatory Visit (INDEPENDENT_AMBULATORY_CARE_PROVIDER_SITE_OTHER): Payer: Medicare Other | Admitting: Cardiovascular Disease

## 2015-04-28 VITALS — BP 136/68 | HR 66 | Ht 62.0 in | Wt 187.0 lb

## 2015-04-28 DIAGNOSIS — E785 Hyperlipidemia, unspecified: Secondary | ICD-10-CM | POA: Diagnosis not present

## 2015-04-28 DIAGNOSIS — I255 Ischemic cardiomyopathy: Secondary | ICD-10-CM | POA: Diagnosis not present

## 2015-04-28 DIAGNOSIS — I2510 Atherosclerotic heart disease of native coronary artery without angina pectoris: Secondary | ICD-10-CM | POA: Diagnosis not present

## 2015-04-28 DIAGNOSIS — I2583 Coronary atherosclerosis due to lipid rich plaque: Secondary | ICD-10-CM

## 2015-04-28 NOTE — Assessment & Plan Note (Signed)
History of hyperlipidemia on atorvastatin 80 mg a day with recent lipid profile performed 04/20/15 revealed a total cholesterol 106, LDL 35 and HDL of 27

## 2015-04-28 NOTE — Progress Notes (Signed)
04/28/2015 Terri [name]   [birth year]  [id number]  Primary Physician No PCP Per Patient Primary Cardiologist: Lorretta Harp MD Renae Gloss   HPI:  Mrs. Mayher is a very pleasant 69 year old mildly overweight married Caucasian female mother of 2 children who is covering by her husband [name] today. I last saw her in the office 01/16/13.Marland Kitchen  Her only cardiac risk factors include 25 pack years of tobacco abuse currently smoking one half pack per day. She has actually strong family history heart disease with both parents and multiple siblings with either died or had-related issues. She is retired Insurance claims handler. Since I saw her in the office over 2 years ago she has suffered an anterior wall myocardial infarction on 01/31/15 while at [facility]. She was taken to [facility] where she was found to have an occluded proximal LAD, occluded marginal branch and high-grade proximal RCA stenosis. She was cathed radially by Dr. Cecilio Asper. She had to synergy drug-eluting stents placed in an overlapping fashion in approximately the M1 in her proximal RCA. Her EF at that time was 25% which is felt clinically improved to 40-45% by 2-D echo 03/03/15 with an anteroapical wall motion abnormality. She does continue to smoke however.  Current Outpatient Prescriptions  Medication Sig Dispense Refill  . atorvastatin (LIPITOR) 80 MG tablet Take 1 tablet (80 mg total) by mouth daily. 30 tablet 1  . carvedilol (COREG) 3.125 MG tablet Take 3.125 mg by mouth 2 (two) times daily.  11  . clopidogrel (PLAVIX) 75 MG tablet Take 75 mg by mouth daily.  11  . [facility] ASPIRIN 81 MG EC tablet Take 81 mg by mouth daily.  0  . lisinopril (PRINIVIL,ZESTRIL) 2.5 MG tablet Take 2.5 mg by mouth daily.  11  . nicotine (NICODERM CQ - DOSED IN MG/24 HOURS) 21 mg/24hr patch 21 mg patch one daily for 6 weeks (dispense #42) then 14 mg patch one daily  for 2 weeks (dispense #14) then 7 mg patch one daily  for 2 weeks (dispense #14) Remove old patch  before applying new one     No current facility-administered medications for this visit.    Allergies  Allergen Reactions  . Erythromycin     nauseated    Social History   Social History  . Marital Status: Married    Spouse Name: N/A  . Number of Children: N/A  . Years of Education: N/A   Occupational History  . Not on file.   Social History Main Topics  . Smoking status: Heavy Tobacco Smoker -- 0.50 packs/day for 50 years    Types: Cigarettes  . Smokeless tobacco: Never Used  . Alcohol Use: No  . Drug Use: No  . Sexual Activity: Yes    Birth Control/ Protection: Surgical   Other Topics Concern  . Not on file   Social History Narrative     Review of Systems: General: negative for chills, fever, night sweats or weight changes.  Cardiovascular: negative for chest pain, dyspnea on exertion, edema, orthopnea, palpitations, paroxysmal nocturnal dyspnea or shortness of breath Dermatological: negative for rash Respiratory: negative for cough or wheezing Urologic: negative for hematuria Abdominal: negative for nausea, vomiting, diarrhea, bright red blood per rectum, melena, or hematemesis Neurologic: negative for visual changes, syncope, or dizziness All other systems reviewed and are otherwise negative except as noted above.    Blood pressure 136/68, pulse 66, height 5\' 2"  (1.575 m), weight 187 lb (84.823 kg).  General appearance: alert and no distress Neck: no adenopathy, no carotid bruit, no JVD, supple, symmetrical, trachea midline and thyroid not enlarged, symmetric, no tenderness/mass/nodules Lungs: clear to auscultation bilaterally Heart: regular rate and rhythm, S1, S2 normal, no murmur, click, rub or gallop Extremities: extremities normal, atraumatic, no cyanosis or edema  EKG not performed today  ASSESSMENT AND PLAN:   Hyperlipidemia History of hyperlipidemia on atorvastatin 80 mg a day with recent lipid  profile performed 04/20/15 revealed a total cholesterol 106, LDL 35 and HDL of 27  Coronary artery disease due to lipid rich plaque History of CAD status post large anterior infarct in [facility] 01/31/15. She was taken to [facility] where she underwent emergency radial recatheterization by Dr. Cecilio Asper  revealing an occluded proximal LAD, and occluded obtuse marginal branch and high-grade proximal RCA disease. She underwent overlapping synergy drug-eluting stent in her proximal LAD and Synergy drug-eluting stenting of her proximal RCA. At that time her EF was 25%. A subsequent 2-D echo performed 03/03/15 revealed improvement in her ejection fraction up to 40-45% with a wall motion and LV in the LAD distribution. She gets occasional atypical chest pain. She denies changes in her breathing pattern. She does continue to smoke however. She is planning on starting cardiac rehabilitation in December. She is on appropriate medications including aspirin and Plavix.      Lorretta Harp MD FACP,FACC,FAHA, [facility] 04/28/2015 10:30 AM

## 2015-04-28 NOTE — Patient Instructions (Signed)

## 2015-04-28 NOTE — Assessment & Plan Note (Signed)
History of CAD status post large anterior infarct in [facility] 01/31/15. She was taken to [facility] where she underwent emergency radial recatheterization by Dr. Cecilio Asper  revealing an occluded proximal LAD, and occluded obtuse marginal branch and high-grade proximal RCA disease. She underwent overlapping synergy drug-eluting stent in her proximal LAD and Synergy drug-eluting stenting of her proximal RCA. At that time her EF was 25%. A subsequent 2-D echo performed 03/03/15 revealed improvement in her ejection fraction up to 40-45% with a wall motion and LV in the LAD distribution. She gets occasional atypical chest pain. She denies changes in her breathing pattern. She does continue to smoke however. She is planning on starting cardiac rehabilitation in December. She is on appropriate medications including aspirin and Plavix.

## 2015-05-07 ENCOUNTER — Ambulatory Visit (HOSPITAL_COMMUNITY): Payer: Medicare Other

## 2015-05-11 ENCOUNTER — Encounter (HOSPITAL_COMMUNITY): Payer: Medicare Other

## 2015-05-13 ENCOUNTER — Encounter (HOSPITAL_COMMUNITY): Payer: Medicare Other

## 2015-05-14 ENCOUNTER — Encounter (HOSPITAL_COMMUNITY)
Admission: RE | Admit: 2015-05-14 | Discharge: 2015-05-14 | Disposition: A | Discharge status: Home / Self Care | Payer: Medicare Other | Source: Ambulatory Visit | Attending: Cardiovascular Disease | Admitting: Cardiovascular Disease

## 2015-05-14 DIAGNOSIS — Z955 Presence of coronary angioplasty implant and graft: Secondary | ICD-10-CM | POA: Insufficient documentation

## 2015-05-14 DIAGNOSIS — I213 ST elevation (STEMI) myocardial infarction of unspecified site: Secondary | ICD-10-CM | POA: Insufficient documentation

## 2015-05-14 NOTE — Progress Notes (Signed)
Cardiac Rehab Medication Review by a Pharmacist  Does the patient  feel that his/her medications are working for him/her?  yes  Has the patient been experiencing any side effects to the medications prescribed?  no  Does the patient measure his/her own blood pressure or blood glucose at home?  no   Does the patient have any problems obtaining medications due to transportation or finances?   no  Understanding of regimen: excellent Understanding of indications: good Potential of compliance: excellent    Pharmacist comments: Patient rates understanding of medications and compliance as excellent. No issues reported.    Elicia Lamp, PharmD, BCPS Clinical Pharmacist Pager [phone] 05/14/2015 8:14 AM

## 2015-05-15 ENCOUNTER — Encounter (HOSPITAL_COMMUNITY): Payer: Medicare Other

## 2015-05-18 ENCOUNTER — Encounter (HOSPITAL_COMMUNITY): Payer: Medicare Other

## 2015-05-18 ENCOUNTER — Encounter (HOSPITAL_COMMUNITY)
Admission: RE | Admit: 2015-05-18 | Discharge: 2015-05-18 | Disposition: A | Discharge status: Home / Self Care | Payer: Medicare Other | Source: Ambulatory Visit | Attending: Cardiovascular Disease | Admitting: Cardiovascular Disease

## 2015-05-18 DIAGNOSIS — Z955 Presence of coronary angioplasty implant and graft: Secondary | ICD-10-CM | POA: Diagnosis not present

## 2015-05-18 DIAGNOSIS — I213 ST elevation (STEMI) myocardial infarction of unspecified site: Secondary | ICD-10-CM | POA: Diagnosis not present

## 2015-05-18 NOTE — Progress Notes (Signed)
Pt started cardiac rehab today.  Pt tolerated light exercise without difficulty. VSS, telemetry-Sinus rhythm, asymptomatic.  Medication list reconciled.  Pt verbalized compliance with medications and denies barriers to compliance. PSYCHOSOCIAL ASSESSMENT:  PHQ-0. Pt exhibits positive coping skills, hopeful outlook with supportive family. No psychosocial needs identified at this time, no psychosocial interventions necessary.    Pt enjoys playing golf.   Pt cardiac rehab  goal is  to have more energy and to loose 10-15 lbs.  Pt encouraged to participate exercising on your ownto increase ability to achieve these goals.   Pt long term cardiac rehab goal is loose 30 lbs, to increase stamina and to go throughout the day without exhaustion or fatigue.  Pt oriented to exercise equipment and routine.  Understanding verbalized. Terri [name] is not using her Nicoderm patches right now. Terri [name] says she is down to smoking 3-4 cigarettes a day. Will continue to monitor the patient throughout  the program.

## 2015-05-20 ENCOUNTER — Encounter (HOSPITAL_COMMUNITY): Payer: Medicare Other

## 2015-05-20 ENCOUNTER — Encounter (HOSPITAL_COMMUNITY)
Admission: RE | Admit: 2015-05-20 | Discharge: 2015-05-20 | Disposition: A | Discharge status: Home / Self Care | Payer: Medicare Other | Source: Ambulatory Visit | Attending: Cardiovascular Disease | Admitting: Cardiovascular Disease

## 2015-05-20 DIAGNOSIS — Z955 Presence of coronary angioplasty implant and graft: Secondary | ICD-10-CM | POA: Diagnosis not present

## 2015-05-20 DIAGNOSIS — I213 ST elevation (STEMI) myocardial infarction of unspecified site: Secondary | ICD-10-CM | POA: Diagnosis not present

## 2015-05-22 ENCOUNTER — Encounter (HOSPITAL_COMMUNITY): Payer: Medicare Other

## 2015-05-22 ENCOUNTER — Encounter (HOSPITAL_COMMUNITY)
Admission: RE | Admit: 2015-05-22 | Discharge: 2015-05-22 | Disposition: A | Discharge status: Home / Self Care | Payer: Medicare Other | Source: Ambulatory Visit | Attending: Cardiovascular Disease | Admitting: Cardiovascular Disease

## 2015-05-22 DIAGNOSIS — Z955 Presence of coronary angioplasty implant and graft: Secondary | ICD-10-CM | POA: Diagnosis not present

## 2015-05-22 DIAGNOSIS — I213 ST elevation (STEMI) myocardial infarction of unspecified site: Secondary | ICD-10-CM | POA: Diagnosis not present

## 2015-05-25 ENCOUNTER — Encounter (HOSPITAL_COMMUNITY): Payer: Medicare Other

## 2015-05-25 ENCOUNTER — Encounter (HOSPITAL_COMMUNITY)
Admission: RE | Admit: 2015-05-25 | Discharge: 2015-05-25 | Disposition: A | Discharge status: Home / Self Care | Payer: Medicare Other | Source: Ambulatory Visit | Attending: Cardiovascular Disease | Admitting: Cardiovascular Disease

## 2015-05-25 DIAGNOSIS — Z955 Presence of coronary angioplasty implant and graft: Secondary | ICD-10-CM | POA: Diagnosis not present

## 2015-05-25 DIAGNOSIS — I213 ST elevation (STEMI) myocardial infarction of unspecified site: Secondary | ICD-10-CM | POA: Diagnosis not present

## 2015-05-27 ENCOUNTER — Encounter (HOSPITAL_COMMUNITY)
Admission: RE | Admit: 2015-05-27 | Discharge: 2015-05-27 | Disposition: A | Discharge status: Home / Self Care | Payer: Medicare Other | Source: Ambulatory Visit | Attending: Cardiovascular Disease | Admitting: Cardiovascular Disease

## 2015-05-27 ENCOUNTER — Encounter (HOSPITAL_COMMUNITY): Payer: Medicare Other

## 2015-05-27 DIAGNOSIS — Z955 Presence of coronary angioplasty implant and graft: Secondary | ICD-10-CM | POA: Diagnosis not present

## 2015-05-27 DIAGNOSIS — I213 ST elevation (STEMI) myocardial infarction of unspecified site: Secondary | ICD-10-CM | POA: Diagnosis not present

## 2015-05-27 NOTE — Progress Notes (Addendum)
Reviewed home exercise with pt today.  Pt plans to walk at home for exercise.  Reviewed THR, pulse, RPE, sign and symptoms, NTG use, and when to call 911 or MD.  Pt voiced understanding.  Also discussed pt's smoking cessation.  She is now down to just 3 cigarettes a day, first thing in the morning and then after meals.  She has cut down significantly and was congratulated on her progress. She is currently using nicotine patches to help quit smoking.  Since she is having issues with cravings, we talked about adding in either the nicotine gum or lozenge for combination therapy.  She was open to trying both options and is highly motivated to quit smoking for good.  We continue to monitor progress.  Alberteen Sam, MA, ACSM RCEP

## 2015-05-29 ENCOUNTER — Encounter (HOSPITAL_COMMUNITY): Payer: Medicare Other

## 2015-05-29 ENCOUNTER — Encounter (HOSPITAL_COMMUNITY)
Admission: RE | Admit: 2015-05-29 | Discharge: 2015-05-29 | Disposition: A | Discharge status: Home / Self Care | Payer: Medicare Other | Source: Ambulatory Visit | Attending: Cardiovascular Disease | Admitting: Cardiovascular Disease

## 2015-05-29 DIAGNOSIS — I213 ST elevation (STEMI) myocardial infarction of unspecified site: Secondary | ICD-10-CM | POA: Diagnosis not present

## 2015-05-29 DIAGNOSIS — Z955 Presence of coronary angioplasty implant and graft: Secondary | ICD-10-CM | POA: Diagnosis not present

## 2015-06-03 ENCOUNTER — Encounter (HOSPITAL_COMMUNITY)
Admission: RE | Admit: 2015-06-03 | Discharge: 2015-06-03 | Disposition: A | Discharge status: Home / Self Care | Payer: Medicare Other | Source: Ambulatory Visit | Attending: Cardiovascular Disease | Admitting: Cardiovascular Disease

## 2015-06-03 ENCOUNTER — Encounter (HOSPITAL_COMMUNITY): Payer: Medicare Other

## 2015-06-03 DIAGNOSIS — I213 ST elevation (STEMI) myocardial infarction of unspecified site: Secondary | ICD-10-CM | POA: Diagnosis not present

## 2015-06-03 DIAGNOSIS — Z955 Presence of coronary angioplasty implant and graft: Secondary | ICD-10-CM | POA: Diagnosis not present

## 2015-06-05 ENCOUNTER — Encounter (HOSPITAL_COMMUNITY): Payer: Medicare Other

## 2015-06-10 ENCOUNTER — Encounter (HOSPITAL_COMMUNITY): Payer: Medicare Other

## 2015-06-10 ENCOUNTER — Encounter (HOSPITAL_COMMUNITY)
Admission: RE | Admit: 2015-06-10 | Discharge: 2015-06-10 | Disposition: A | Discharge status: Home / Self Care | Payer: Medicare Other | Source: Ambulatory Visit | Attending: Cardiovascular Disease | Admitting: Cardiovascular Disease

## 2015-06-10 DIAGNOSIS — I213 ST elevation (STEMI) myocardial infarction of unspecified site: Secondary | ICD-10-CM | POA: Insufficient documentation

## 2015-06-10 DIAGNOSIS — Z955 Presence of coronary angioplasty implant and graft: Secondary | ICD-10-CM | POA: Diagnosis not present

## 2015-06-12 ENCOUNTER — Encounter (HOSPITAL_COMMUNITY): Payer: Medicare Other

## 2015-06-12 ENCOUNTER — Encounter (HOSPITAL_COMMUNITY)
Admission: RE | Admit: 2015-06-12 | Discharge: 2015-06-12 | Disposition: A | Discharge status: Home / Self Care | Payer: Medicare Other | Source: Ambulatory Visit | Attending: Cardiovascular Disease | Admitting: Cardiovascular Disease

## 2015-06-12 DIAGNOSIS — Z955 Presence of coronary angioplasty implant and graft: Secondary | ICD-10-CM | POA: Diagnosis not present

## 2015-06-12 DIAGNOSIS — I213 ST elevation (STEMI) myocardial infarction of unspecified site: Secondary | ICD-10-CM | POA: Diagnosis not present

## 2015-06-15 ENCOUNTER — Telehealth (HOSPITAL_COMMUNITY): Payer: Self-pay | Admitting: *Deleted

## 2015-06-15 ENCOUNTER — Encounter (HOSPITAL_COMMUNITY): Payer: Medicare Other

## 2015-06-15 ENCOUNTER — Encounter (HOSPITAL_COMMUNITY): Admission: RE | Admit: 2015-06-15 | Payer: Medicare Other | Source: Ambulatory Visit

## 2015-06-15 NOTE — Telephone Encounter (Signed)
Pt called out today due to inclement weather.  Pt indicated that the road she lives on had not been plowed and she did not feel comfortable driving herself to rehab. Cherre Huger, BSN

## 2015-06-17 ENCOUNTER — Encounter (HOSPITAL_COMMUNITY)
Admission: RE | Admit: 2015-06-17 | Discharge: 2015-06-17 | Disposition: A | Discharge status: Home / Self Care | Payer: Medicare Other | Source: Ambulatory Visit | Attending: Cardiovascular Disease | Admitting: Cardiovascular Disease

## 2015-06-17 ENCOUNTER — Encounter (HOSPITAL_COMMUNITY): Payer: Medicare Other

## 2015-06-17 DIAGNOSIS — Z955 Presence of coronary angioplasty implant and graft: Secondary | ICD-10-CM | POA: Diagnosis not present

## 2015-06-17 DIAGNOSIS — I213 ST elevation (STEMI) myocardial infarction of unspecified site: Secondary | ICD-10-CM | POA: Diagnosis not present

## 2015-06-17 NOTE — Progress Notes (Signed)
QUALITY OF LIFE SCORE REVIEW  Pt completed Quality of Life survey as a participant in Cardiac Rehab. Scores 21.0 or below are considered low. Pt score  low in  physiological and spiritual 20.79, family . Patient quality of life slightly altered by physical constraints which limits ability to perform as prior to recent cardiac illness. Liana Gerold emotional support and reassurance.  Will continue to monitor and intervene as necessary.  Harmonie has some worries about her husbands health and is still trying to cut back on smoking cigarettes. Gracelynne smokes 3 cigarettes a day. Zarian says she has had some issues in the past with being depressed but denies being depressed now. Will forward quality of life questionnaire to Dr Kennon Holter office for review.

## 2015-06-17 NOTE — Progress Notes (Signed)
Terri [name] 70 y.o. female Nutrition Note Spoke with pt.  Nutrition Survey reviewed with pt. Pt is following Step 1 of the Therapeutic Lifestyle Changes diet. Pt's husband is a barrier to dietary changes. Pt wants to lose wt. Pt has been trying to lose wt by changing her diet and decreasing portion sizes. Pt wt today 84.1 kg, which is down 2.3 kg over the past month. Per pt, pt wt before MI was "around 200 lb." Pt wt is down 15 lb from her highest wt. Wt loss tips reviewed. Pt is pre-diabetic according to her last A1c. Pt was unaware she has pre-diabetes. Pt states her sisters are diabetic. Pre-diabetes discussed. Pt expressed understanding of the information reviewed. Pt aware of nutrition education classes offered. Lab Results  Component Value Date   HGBA1C 6.0 02/27/2015   Wt Readings from Last 3 Encounters:  05/14/15 190 lb 7.6 oz (86.4 kg)  04/28/15 187 lb (84.823 kg)  02/27/15 190 lb (86.183 kg)   Nutrition Diagnosis ? Food-and nutrition-related knowledge deficit related to lack of exposure to information as related to diagnosis of: ? CVD ? Pre-DM ? Obesity related to excessive energy intake as evidenced by a BMI of 33.2  Nutrition Intervention ? Benefits of adopting Therapeutic Lifestyle Changes discussed when Medficts reviewed. ? Pt to attend the Portion Distortion class ? Pt to attend the  ? Nutrition I class                      ? Nutrition II class ? Pt given handouts for: ? Nutrition I class ? Nutrition II class ? Continue client-centered nutrition education by RD, as part of interdisciplinary care.  Goal(s) ? Pt to watch out for sweets and added sugar (e.g. Sweet tea) ? Pt to identify and limit food sources of saturated fat, trans fat, and cholesterol ? Pt to identify food quantities necessary to achieve: ? wt loss to a goal wt of 166-184 lb (75.5-83.7 kg) at graduation from cardiac rehab.   Monitor and Evaluate progress toward nutrition goal with team.  Derek Mound,  M.Ed, RD, LDN, CDE 06/17/2015 11:08 AM

## 2015-06-19 ENCOUNTER — Encounter (HOSPITAL_COMMUNITY): Payer: Medicare Other

## 2015-06-19 ENCOUNTER — Encounter (HOSPITAL_COMMUNITY)
Admission: RE | Admit: 2015-06-19 | Discharge: 2015-06-19 | Disposition: A | Discharge status: Home / Self Care | Payer: Medicare Other | Source: Ambulatory Visit | Attending: Cardiovascular Disease | Admitting: Cardiovascular Disease

## 2015-06-19 DIAGNOSIS — Z955 Presence of coronary angioplasty implant and graft: Secondary | ICD-10-CM | POA: Diagnosis not present

## 2015-06-19 DIAGNOSIS — I213 ST elevation (STEMI) myocardial infarction of unspecified site: Secondary | ICD-10-CM | POA: Diagnosis not present

## 2015-06-22 ENCOUNTER — Encounter (HOSPITAL_COMMUNITY): Payer: Medicare Other

## 2015-06-22 ENCOUNTER — Telehealth (HOSPITAL_COMMUNITY): Payer: Self-pay | Admitting: General Practice

## 2015-06-24 ENCOUNTER — Encounter (HOSPITAL_COMMUNITY)
Admission: RE | Admit: 2015-06-24 | Discharge: 2015-06-24 | Disposition: A | Discharge status: Home / Self Care | Payer: Medicare Other | Source: Ambulatory Visit | Attending: Cardiovascular Disease | Admitting: Cardiovascular Disease

## 2015-06-24 ENCOUNTER — Encounter (HOSPITAL_COMMUNITY): Payer: Medicare Other

## 2015-06-24 DIAGNOSIS — I213 ST elevation (STEMI) myocardial infarction of unspecified site: Secondary | ICD-10-CM | POA: Diagnosis not present

## 2015-06-24 DIAGNOSIS — Z955 Presence of coronary angioplasty implant and graft: Secondary | ICD-10-CM | POA: Diagnosis not present

## 2015-06-26 ENCOUNTER — Encounter (HOSPITAL_COMMUNITY): Payer: Medicare Other

## 2015-06-26 ENCOUNTER — Encounter (HOSPITAL_COMMUNITY)
Admission: RE | Admit: 2015-06-26 | Discharge: 2015-06-26 | Disposition: A | Discharge status: Home / Self Care | Payer: Medicare Other | Source: Ambulatory Visit | Attending: Cardiovascular Disease | Admitting: Cardiovascular Disease

## 2015-06-26 DIAGNOSIS — Z955 Presence of coronary angioplasty implant and graft: Secondary | ICD-10-CM | POA: Diagnosis not present

## 2015-06-26 DIAGNOSIS — I213 ST elevation (STEMI) myocardial infarction of unspecified site: Secondary | ICD-10-CM | POA: Diagnosis not present

## 2015-06-29 ENCOUNTER — Encounter (HOSPITAL_COMMUNITY)
Admission: RE | Admit: 2015-06-29 | Discharge: 2015-06-29 | Disposition: A | Discharge status: Home / Self Care | Payer: Medicare Other | Source: Ambulatory Visit | Attending: Cardiovascular Disease | Admitting: Cardiovascular Disease

## 2015-06-29 ENCOUNTER — Encounter (HOSPITAL_COMMUNITY): Payer: Medicare Other

## 2015-06-29 DIAGNOSIS — I213 ST elevation (STEMI) myocardial infarction of unspecified site: Secondary | ICD-10-CM | POA: Diagnosis not present

## 2015-06-29 DIAGNOSIS — Z955 Presence of coronary angioplasty implant and graft: Secondary | ICD-10-CM | POA: Diagnosis not present

## 2015-07-01 ENCOUNTER — Encounter (HOSPITAL_COMMUNITY): Payer: Medicare Other

## 2015-07-01 ENCOUNTER — Encounter (HOSPITAL_COMMUNITY)
Admission: RE | Admit: 2015-07-01 | Discharge: 2015-07-01 | Disposition: A | Discharge status: Home / Self Care | Payer: Medicare Other | Source: Ambulatory Visit | Attending: Cardiovascular Disease | Admitting: Cardiovascular Disease

## 2015-07-01 DIAGNOSIS — I213 ST elevation (STEMI) myocardial infarction of unspecified site: Secondary | ICD-10-CM | POA: Diagnosis not present

## 2015-07-01 DIAGNOSIS — Z955 Presence of coronary angioplasty implant and graft: Secondary | ICD-10-CM | POA: Diagnosis not present

## 2015-07-03 ENCOUNTER — Encounter (HOSPITAL_COMMUNITY): Payer: Medicare Other

## 2015-07-06 ENCOUNTER — Encounter (HOSPITAL_COMMUNITY)
Admission: RE | Admit: 2015-07-06 | Discharge: 2015-07-06 | Disposition: A | Discharge status: Home / Self Care | Payer: Medicare Other | Source: Ambulatory Visit | Attending: Cardiovascular Disease | Admitting: Cardiovascular Disease

## 2015-07-06 ENCOUNTER — Encounter (HOSPITAL_COMMUNITY): Payer: Medicare Other

## 2015-07-06 DIAGNOSIS — Z955 Presence of coronary angioplasty implant and graft: Secondary | ICD-10-CM | POA: Diagnosis not present

## 2015-07-06 DIAGNOSIS — I213 ST elevation (STEMI) myocardial infarction of unspecified site: Secondary | ICD-10-CM | POA: Diagnosis not present

## 2015-07-08 ENCOUNTER — Encounter (HOSPITAL_COMMUNITY): Payer: Medicare Other

## 2015-07-08 ENCOUNTER — Encounter (HOSPITAL_COMMUNITY)
Admission: RE | Admit: 2015-07-08 | Discharge: 2015-07-08 | Disposition: A | Discharge status: Home / Self Care | Payer: Medicare Other | Source: Ambulatory Visit | Attending: Cardiovascular Disease | Admitting: Cardiovascular Disease

## 2015-07-08 DIAGNOSIS — I213 ST elevation (STEMI) myocardial infarction of unspecified site: Secondary | ICD-10-CM | POA: Diagnosis not present

## 2015-07-08 DIAGNOSIS — Z955 Presence of coronary angioplasty implant and graft: Secondary | ICD-10-CM | POA: Diagnosis not present

## 2015-07-10 ENCOUNTER — Encounter (HOSPITAL_COMMUNITY): Payer: Medicare Other

## 2015-07-10 ENCOUNTER — Encounter (HOSPITAL_COMMUNITY)
Admission: RE | Admit: 2015-07-10 | Discharge: 2015-07-10 | Disposition: A | Discharge status: Home / Self Care | Payer: Medicare Other | Source: Ambulatory Visit | Attending: Cardiovascular Disease | Admitting: Cardiovascular Disease

## 2015-07-10 DIAGNOSIS — I213 ST elevation (STEMI) myocardial infarction of unspecified site: Secondary | ICD-10-CM | POA: Diagnosis not present

## 2015-07-10 DIAGNOSIS — Z955 Presence of coronary angioplasty implant and graft: Secondary | ICD-10-CM | POA: Diagnosis not present

## 2015-07-13 ENCOUNTER — Encounter (HOSPITAL_COMMUNITY)
Admission: RE | Admit: 2015-07-13 | Discharge: 2015-07-13 | Disposition: A | Discharge status: Home / Self Care | Payer: Medicare Other | Source: Ambulatory Visit | Attending: Cardiovascular Disease | Admitting: Cardiovascular Disease

## 2015-07-13 ENCOUNTER — Encounter (HOSPITAL_COMMUNITY): Payer: Medicare Other

## 2015-07-13 DIAGNOSIS — Z955 Presence of coronary angioplasty implant and graft: Secondary | ICD-10-CM | POA: Diagnosis not present

## 2015-07-13 DIAGNOSIS — I213 ST elevation (STEMI) myocardial infarction of unspecified site: Secondary | ICD-10-CM | POA: Diagnosis not present

## 2015-07-13 NOTE — Progress Notes (Signed)
Terri [name] has been having leg cramps for the past few months that sometimes wake her up during resting at home. Will notify Dr Gwenlyn Found of the patients complaints. Vital signs have remained stable at cardiac rehab.

## 2015-07-14 ENCOUNTER — Telehealth: Payer: Self-pay

## 2015-07-14 DIAGNOSIS — J0100 Acute maxillary sinusitis, unspecified: Secondary | ICD-10-CM | POA: Diagnosis not present

## 2015-07-14 NOTE — Telephone Encounter (Signed)
-----   Message from Magda Kiel, RN sent at 07/13/2015 10:42 AM EST ----- Regarding: leg cramps Good morning Maudie Mercury,  I want to bring it to your attention that Helga reports that she has been experiencing leg cramps over the past several months. Would you let Dr Gwenlyn Found Know? Razan is still smoking 3-4 cigarettes a day. Itzia's feet are warm to touch. I can palpate her pedal pulses at about a 1+.  Nanette is not on any diuretics at this time.  Thanks for your assistance  Barnet Pall RN BSN

## 2015-07-14 NOTE — Telephone Encounter (Signed)
Left message to call back  

## 2015-07-15 ENCOUNTER — Encounter (HOSPITAL_COMMUNITY): Payer: Medicare Other

## 2015-07-15 ENCOUNTER — Telehealth (HOSPITAL_COMMUNITY): Payer: Self-pay | Admitting: General Practice

## 2015-07-17 ENCOUNTER — Encounter (HOSPITAL_COMMUNITY)
Admission: RE | Admit: 2015-07-17 | Discharge: 2015-07-17 | Disposition: A | Discharge status: Home / Self Care | Payer: Medicare Other | Source: Ambulatory Visit | Attending: Cardiovascular Disease | Admitting: Cardiovascular Disease

## 2015-07-17 ENCOUNTER — Encounter (HOSPITAL_COMMUNITY): Payer: Medicare Other

## 2015-07-17 DIAGNOSIS — I213 ST elevation (STEMI) myocardial infarction of unspecified site: Secondary | ICD-10-CM | POA: Diagnosis not present

## 2015-07-17 DIAGNOSIS — Z955 Presence of coronary angioplasty implant and graft: Secondary | ICD-10-CM | POA: Diagnosis not present

## 2015-07-20 ENCOUNTER — Encounter (HOSPITAL_COMMUNITY): Payer: Medicare Other

## 2015-07-20 ENCOUNTER — Encounter (HOSPITAL_COMMUNITY)
Admission: RE | Admit: 2015-07-20 | Discharge: 2015-07-20 | Disposition: A | Discharge status: Home / Self Care | Payer: Medicare Other | Source: Ambulatory Visit | Attending: Cardiovascular Disease | Admitting: Cardiovascular Disease

## 2015-07-20 DIAGNOSIS — I213 ST elevation (STEMI) myocardial infarction of unspecified site: Secondary | ICD-10-CM | POA: Diagnosis not present

## 2015-07-20 DIAGNOSIS — Z955 Presence of coronary angioplasty implant and graft: Secondary | ICD-10-CM | POA: Diagnosis not present

## 2015-07-22 ENCOUNTER — Encounter (HOSPITAL_COMMUNITY): Payer: Medicare Other

## 2015-07-23 NOTE — Telephone Encounter (Signed)
Returned pt no answer, no vm setup.

## 2015-07-23 NOTE — Telephone Encounter (Signed)
Follow up ° °Pt returned the call  °

## 2015-07-24 ENCOUNTER — Encounter (HOSPITAL_COMMUNITY): Payer: Medicare Other

## 2015-07-27 ENCOUNTER — Encounter (HOSPITAL_COMMUNITY): Payer: Medicare Other

## 2015-07-27 ENCOUNTER — Encounter (HOSPITAL_COMMUNITY)
Admission: RE | Admit: 2015-07-27 | Discharge: 2015-07-27 | Disposition: A | Discharge status: Home / Self Care | Payer: Medicare Other | Source: Ambulatory Visit | Attending: Cardiovascular Disease | Admitting: Cardiovascular Disease

## 2015-07-27 DIAGNOSIS — Z955 Presence of coronary angioplasty implant and graft: Secondary | ICD-10-CM | POA: Diagnosis not present

## 2015-07-27 DIAGNOSIS — I213 ST elevation (STEMI) myocardial infarction of unspecified site: Secondary | ICD-10-CM | POA: Diagnosis not present

## 2015-07-29 ENCOUNTER — Encounter (HOSPITAL_COMMUNITY)
Admission: RE | Admit: 2015-07-29 | Discharge: 2015-07-29 | Disposition: A | Discharge status: Home / Self Care | Payer: Medicare Other | Source: Ambulatory Visit | Attending: Cardiovascular Disease | Admitting: Cardiovascular Disease

## 2015-07-29 ENCOUNTER — Encounter (HOSPITAL_COMMUNITY): Payer: Medicare Other

## 2015-07-29 DIAGNOSIS — I213 ST elevation (STEMI) myocardial infarction of unspecified site: Secondary | ICD-10-CM | POA: Diagnosis not present

## 2015-07-29 DIAGNOSIS — Z955 Presence of coronary angioplasty implant and graft: Secondary | ICD-10-CM | POA: Diagnosis not present

## 2015-07-31 ENCOUNTER — Encounter (HOSPITAL_COMMUNITY)
Admission: RE | Admit: 2015-07-31 | Discharge: 2015-07-31 | Disposition: A | Discharge status: Home / Self Care | Payer: Medicare Other | Source: Ambulatory Visit | Attending: Cardiovascular Disease | Admitting: Cardiovascular Disease

## 2015-07-31 ENCOUNTER — Encounter (HOSPITAL_COMMUNITY): Payer: Medicare Other

## 2015-07-31 DIAGNOSIS — I213 ST elevation (STEMI) myocardial infarction of unspecified site: Secondary | ICD-10-CM | POA: Diagnosis not present

## 2015-07-31 DIAGNOSIS — Z955 Presence of coronary angioplasty implant and graft: Secondary | ICD-10-CM | POA: Diagnosis not present

## 2015-08-03 ENCOUNTER — Encounter (HOSPITAL_COMMUNITY): Payer: Medicare Other

## 2015-08-03 ENCOUNTER — Encounter (HOSPITAL_COMMUNITY)
Admission: RE | Admit: 2015-08-03 | Discharge: 2015-08-03 | Disposition: A | Discharge status: Home / Self Care | Payer: Medicare Other | Source: Ambulatory Visit | Attending: Cardiovascular Disease | Admitting: Cardiovascular Disease

## 2015-08-03 DIAGNOSIS — I213 ST elevation (STEMI) myocardial infarction of unspecified site: Secondary | ICD-10-CM | POA: Diagnosis not present

## 2015-08-03 DIAGNOSIS — Z955 Presence of coronary angioplasty implant and graft: Secondary | ICD-10-CM | POA: Diagnosis not present

## 2015-08-05 ENCOUNTER — Encounter (HOSPITAL_COMMUNITY): Payer: Medicare Other

## 2015-08-05 ENCOUNTER — Encounter (HOSPITAL_COMMUNITY)
Admission: RE | Admit: 2015-08-05 | Discharge: 2015-08-05 | Disposition: A | Discharge status: Home / Self Care | Payer: Medicare Other | Source: Ambulatory Visit | Attending: Cardiovascular Disease | Admitting: Cardiovascular Disease

## 2015-08-05 DIAGNOSIS — I213 ST elevation (STEMI) myocardial infarction of unspecified site: Secondary | ICD-10-CM | POA: Insufficient documentation

## 2015-08-05 DIAGNOSIS — Z955 Presence of coronary angioplasty implant and graft: Secondary | ICD-10-CM | POA: Diagnosis not present

## 2015-08-07 ENCOUNTER — Encounter (HOSPITAL_COMMUNITY): Payer: Medicare Other

## 2015-08-07 ENCOUNTER — Encounter (HOSPITAL_COMMUNITY)
Admission: RE | Admit: 2015-08-07 | Discharge: 2015-08-07 | Disposition: A | Discharge status: Home / Self Care | Payer: Medicare Other | Source: Ambulatory Visit | Attending: Cardiovascular Disease | Admitting: Cardiovascular Disease

## 2015-08-07 DIAGNOSIS — Z955 Presence of coronary angioplasty implant and graft: Secondary | ICD-10-CM | POA: Diagnosis not present

## 2015-08-07 DIAGNOSIS — I213 ST elevation (STEMI) myocardial infarction of unspecified site: Secondary | ICD-10-CM | POA: Diagnosis not present

## 2015-08-10 ENCOUNTER — Encounter (HOSPITAL_COMMUNITY): Payer: Medicare Other

## 2015-08-10 ENCOUNTER — Encounter (HOSPITAL_COMMUNITY)
Admission: RE | Admit: 2015-08-10 | Discharge: 2015-08-10 | Disposition: A | Discharge status: Home / Self Care | Payer: Medicare Other | Source: Ambulatory Visit | Attending: Cardiovascular Disease | Admitting: Cardiovascular Disease

## 2015-08-10 DIAGNOSIS — Z955 Presence of coronary angioplasty implant and graft: Secondary | ICD-10-CM | POA: Diagnosis not present

## 2015-08-10 DIAGNOSIS — I213 ST elevation (STEMI) myocardial infarction of unspecified site: Secondary | ICD-10-CM | POA: Diagnosis not present

## 2015-08-12 ENCOUNTER — Encounter (HOSPITAL_COMMUNITY)
Admission: RE | Admit: 2015-08-12 | Discharge: 2015-08-12 | Disposition: A | Discharge status: Home / Self Care | Payer: Medicare Other | Source: Ambulatory Visit | Attending: Cardiovascular Disease | Admitting: Cardiovascular Disease

## 2015-08-12 ENCOUNTER — Encounter (HOSPITAL_COMMUNITY): Payer: Medicare Other

## 2015-08-12 DIAGNOSIS — I213 ST elevation (STEMI) myocardial infarction of unspecified site: Secondary | ICD-10-CM | POA: Diagnosis not present

## 2015-08-12 DIAGNOSIS — Z955 Presence of coronary angioplasty implant and graft: Secondary | ICD-10-CM | POA: Diagnosis not present

## 2015-08-14 ENCOUNTER — Encounter (HOSPITAL_COMMUNITY): Payer: Medicare Other

## 2015-08-14 ENCOUNTER — Encounter (HOSPITAL_COMMUNITY)
Admission: RE | Admit: 2015-08-14 | Discharge: 2015-08-14 | Disposition: A | Discharge status: Home / Self Care | Payer: Medicare Other | Source: Ambulatory Visit | Attending: Cardiovascular Disease | Admitting: Cardiovascular Disease

## 2015-08-14 DIAGNOSIS — I213 ST elevation (STEMI) myocardial infarction of unspecified site: Secondary | ICD-10-CM | POA: Diagnosis not present

## 2015-08-14 DIAGNOSIS — Z955 Presence of coronary angioplasty implant and graft: Secondary | ICD-10-CM | POA: Diagnosis not present

## 2015-08-14 NOTE — Progress Notes (Signed)
Terri [name] reported having a headache of a 5/10 when she started exercise at cardiac rehab. Patient reported that her headache was a 3/10 upon exit from cardiac rehab. Will continue to monitor the patient throughout  the program.

## 2015-08-17 ENCOUNTER — Encounter (HOSPITAL_COMMUNITY)
Admission: RE | Admit: 2015-08-17 | Discharge: 2015-08-17 | Disposition: A | Discharge status: Home / Self Care | Payer: Medicare Other | Source: Ambulatory Visit | Attending: Cardiovascular Disease | Admitting: Cardiovascular Disease

## 2015-08-17 ENCOUNTER — Encounter (HOSPITAL_COMMUNITY): Payer: Medicare Other

## 2015-08-17 DIAGNOSIS — Z955 Presence of coronary angioplasty implant and graft: Secondary | ICD-10-CM | POA: Diagnosis not present

## 2015-08-17 DIAGNOSIS — I213 ST elevation (STEMI) myocardial infarction of unspecified site: Secondary | ICD-10-CM | POA: Diagnosis not present

## 2015-08-19 ENCOUNTER — Encounter (HOSPITAL_COMMUNITY): Payer: Medicare Other

## 2015-08-19 ENCOUNTER — Encounter (HOSPITAL_COMMUNITY)
Admission: RE | Admit: 2015-08-19 | Discharge: 2015-08-19 | Disposition: A | Discharge status: Home / Self Care | Payer: Medicare Other | Source: Ambulatory Visit | Attending: Cardiovascular Disease | Admitting: Cardiovascular Disease

## 2015-08-19 DIAGNOSIS — Z955 Presence of coronary angioplasty implant and graft: Secondary | ICD-10-CM | POA: Diagnosis not present

## 2015-08-19 DIAGNOSIS — I213 ST elevation (STEMI) myocardial infarction of unspecified site: Secondary | ICD-10-CM | POA: Diagnosis not present

## 2015-08-21 ENCOUNTER — Encounter (HOSPITAL_COMMUNITY): Payer: Medicare Other

## 2015-08-21 ENCOUNTER — Encounter (HOSPITAL_COMMUNITY)
Admission: RE | Admit: 2015-08-21 | Discharge: 2015-08-21 | Disposition: A | Discharge status: Home / Self Care | Payer: Medicare Other | Source: Ambulatory Visit | Attending: Cardiovascular Disease | Admitting: Cardiovascular Disease

## 2015-08-21 DIAGNOSIS — Z955 Presence of coronary angioplasty implant and graft: Secondary | ICD-10-CM | POA: Diagnosis not present

## 2015-08-21 DIAGNOSIS — I213 ST elevation (STEMI) myocardial infarction of unspecified site: Secondary | ICD-10-CM | POA: Diagnosis not present

## 2015-08-21 NOTE — Progress Notes (Signed)
Pt graduated from cardiac rehab program today with completion of 33 exercise sessions in Phase II. Pt maintained good attendance and progressed nicely during his participation in rehab as evidenced by increased MET level.   Medication list reconciled. Repeat  PHQ score- 0 .  Pt has made significant lifestyle changes and should be commended for her success. Pt feels he has achieved his goals during cardiac rehab.   Pt plans to continue exercise [place] and using her exercise bike while Scottlynn is there. Mellany says she feels much better since she has been participating in cardiac rehab. Dean continues to smoke 3 cigarettes a day. Smoking cessation encouraged.

## 2015-08-24 ENCOUNTER — Encounter (HOSPITAL_COMMUNITY): Payer: Medicare Other

## 2015-08-26 ENCOUNTER — Encounter (HOSPITAL_COMMUNITY): Payer: Medicare Other

## 2015-08-28 ENCOUNTER — Encounter (HOSPITAL_COMMUNITY): Payer: Medicare Other

## 2015-08-31 ENCOUNTER — Encounter (HOSPITAL_COMMUNITY): Payer: Medicare Other

## 2015-09-02 ENCOUNTER — Encounter (HOSPITAL_COMMUNITY): Payer: Medicare Other

## 2015-09-04 ENCOUNTER — Encounter (HOSPITAL_COMMUNITY): Payer: Medicare Other

## 2015-09-07 ENCOUNTER — Encounter (HOSPITAL_COMMUNITY): Payer: Medicare Other

## 2015-09-09 ENCOUNTER — Encounter (HOSPITAL_COMMUNITY): Payer: Medicare Other

## 2015-09-11 ENCOUNTER — Encounter (HOSPITAL_COMMUNITY): Payer: Medicare Other

## 2015-09-14 ENCOUNTER — Encounter (HOSPITAL_COMMUNITY): Payer: Medicare Other

## 2015-09-16 ENCOUNTER — Encounter (HOSPITAL_COMMUNITY): Payer: Medicare Other

## 2015-09-18 ENCOUNTER — Encounter (HOSPITAL_COMMUNITY): Payer: Medicare Other

## 2015-10-23 ENCOUNTER — Ambulatory Visit (INDEPENDENT_AMBULATORY_CARE_PROVIDER_SITE_OTHER): Payer: Medicare Other | Admitting: Cardiovascular Disease

## 2015-10-23 ENCOUNTER — Encounter: Payer: Self-pay | Admitting: Cardiovascular Disease

## 2015-10-23 VITALS — BP 150/82 | HR 67 | Ht 63.5 in | Wt 182.0 lb

## 2015-10-23 DIAGNOSIS — I2510 Atherosclerotic heart disease of native coronary artery without angina pectoris: Secondary | ICD-10-CM | POA: Diagnosis not present

## 2015-10-23 DIAGNOSIS — I2583 Coronary atherosclerosis due to lipid rich plaque: Secondary | ICD-10-CM

## 2015-10-23 DIAGNOSIS — E785 Hyperlipidemia, unspecified: Secondary | ICD-10-CM

## 2015-10-23 NOTE — Progress Notes (Signed)
10/23/2015 Terri [name]   12-24-1945  [id number]  Primary Physician No PCP Per Patient Primary Cardiologist: Terri Harp MD Terri [name]   HPI:  Terri [name] is a very pleasant 70 year old mildly overweight married Caucasian female mother of 2 children whose husband Terri [name] is also a patient of mine. I last saw her in the office 04/28/15. Her only cardiac risk factors include 25 pack years of tobacco abuse currently smoking one half pack per day. She has actually strong family history heart disease with both parents and multiple siblings with either died or had-related issues. She is retired Insurance claims handler. Since I saw her in the office over 2 years ago she has suffered an anterior wall myocardial infarction on 01/31/15 while at [facility]. She was taken to [facility] where she was found to have an occluded proximal LAD, occluded marginal branch and high-grade proximal RCA stenosis. She was cathed radially by Terri [name]. She had to synergy drug-eluting stents placed in an overlapping fashion in er proximal LAD and her proximal RCA. Her EF at that time was 25% which is felt clinically improved to 40-45% by 2-D echo 03/03/15 with an anteroapical wall motion abnormality. She does continue to smoke 3 cigarettes .   Current Outpatient Prescriptions  Medication Sig Dispense Refill  . acetaminophen (TYLENOL) 325 MG tablet Take 650 mg by mouth every 6 (six) hours as needed for mild pain.    Marland Kitchen atorvastatin (LIPITOR) 80 MG tablet Take 1 tablet (80 mg total) by mouth daily. 30 tablet 1  . carvedilol (COREG) 3.125 MG tablet Take 3.125 mg by mouth 2 (two) times daily.  11  . clopidogrel (PLAVIX) 75 MG tablet Take 75 mg by mouth daily.  11  . [facility]S ASPIRIN 81 MG EC tablet Take 81 mg by mouth daily.  0  . dextromethorphan 7.5 MG/5ML SYRP Take 7.5 mg by mouth every 6 (six) hours as needed.    Marland Kitchen lisinopril (PRINIVIL,ZESTRIL) 2.5 MG tablet Take 2.5 mg by mouth  daily.  11   No current facility-administered medications for this visit.    Allergies  Allergen Reactions  . Erythromycin     nauseated    Social History   Social History  . Marital Status: Married    Spouse Name: N/A  . Number of Children: N/A  . Years of Education: N/A   Occupational History  . Not on file.   Social History Main Topics  . Smoking status: Heavy Tobacco Smoker -- 0.50 packs/day for 50 years    Types: Cigarettes  . Smokeless tobacco: Never Used  . Alcohol Use: No  . Drug Use: No  . Sexual Activity: Yes    Birth Control/ Protection: Surgical   Other Topics Concern  . Not on file   Social History Narrative     Review of Systems: General: negative for chills, fever, night sweats or weight changes.  Cardiovascular: negative for chest pain, dyspnea on exertion, edema, orthopnea, palpitations, paroxysmal nocturnal dyspnea or shortness of breath Dermatological: negative for rash Respiratory: negative for cough or wheezing Urologic: negative for hematuria Abdominal: negative for nausea, vomiting, diarrhea, bright red blood per rectum, melena, or hematemesis Neurologic: negative for visual changes, syncope, or dizziness All other systems reviewed and are otherwise negative except as noted above.    Blood pressure 150/82, pulse 67, height 5' 3.5" (1.613 m), weight 182 lb (82.555 kg), SpO2 96 %.  General appearance: alert and no distress Neck:  no adenopathy, no carotid bruit, no JVD, supple, symmetrical, trachea midline and thyroid not enlarged, symmetric, no tenderness/mass/nodules Lungs: clear to auscultation bilaterally Heart: regular rate and rhythm, S1, S2 normal, no murmur, click, rub or gallop Extremities: extremities normal, atraumatic, no cyanosis or edema  EKG normal sinus rhythm at 67 with left anterior block and septal infarct I personally reviewed this EKG  ASSESSMENT AND PLAN:   Hyperlipidemia History of hyperlipidemia on statin therapy  was lipid profile performed 04/20/15 revealing total cholesterol 106, LDL 35 and HDL of 27.  Coronary artery disease due to lipid rich plaque History of CAD status post anterior wall [place] infarction 01/31/15 the [facility]. She was taken to grand [facility] where she had an occluded LAD an occluded marginal branch and high-grade proximal RCA stenosis. She had a synergy drug-eluting stent placed in her proximal LAD and RCA. EF at that time was 25% which improved clinically by 2-D echo to 40-45% 9/27/16with a residual anteroapical wall motion abnormality. She denies chest pain or shortness of breath.      Terri Harp MD FACP,FACC,FAHA, [facility] 10/23/2015 1:46 PM

## 2015-10-23 NOTE — Assessment & Plan Note (Signed)
History of CAD status post anterior wall [place] infarction 01/31/15 the [facility]. She was taken to grand [facility] where she had an occluded LAD an occluded marginal branch and high-grade proximal RCA stenosis. She had a synergy drug-eluting stent placed in her proximal LAD and RCA. EF at that time was 25% which improved clinically by 2-D echo to 40-45% 9/27/16with a residual anteroapical wall motion abnormality. She denies chest pain or shortness of breath.

## 2015-10-23 NOTE — Patient Instructions (Signed)
Your physician recommends that you continue on your current medications as directed. Please refer to the Current Medication list given to you today.  Dr Gwenlyn Found recommends that you schedule a follow-up appointment in 1 year. You will receive a reminder letter in the mail two months in advance. If you don't receive a letter, please call our office to schedule the follow-up appointment.  If you need a refill on your cardiac medications before your next appointment, please call your pharmacy.

## 2015-10-23 NOTE — Assessment & Plan Note (Signed)
History of hyperlipidemia on statin therapy was lipid profile performed 04/20/15 revealing total cholesterol 106, LDL 35 and HDL of 27.

## 2016-02-19 ENCOUNTER — Other Ambulatory Visit: Payer: Self-pay | Admitting: *Deleted

## 2016-02-19 MED ORDER — CARVEDILOL 3.125 MG PO TABS: 3.1250 mg | ORAL_TABLET | Freq: Two times a day (BID) | ORAL | 11 refills | Status: DC

## 2016-02-19 MED ORDER — LISINOPRIL 2.5 MG PO TABS: 2.5000 mg | ORAL_TABLET | Freq: Every day | ORAL | 11 refills | Status: DC

## 2016-02-19 MED ORDER — ATORVASTATIN CALCIUM 80 MG PO TABS: 80.0000 mg | ORAL_TABLET | Freq: Every day | ORAL | 11 refills | Status: DC

## 2016-02-19 MED ORDER — CLOPIDOGREL BISULFATE 75 MG PO TABS: 75.0000 mg | ORAL_TABLET | Freq: Every day | ORAL | 11 refills | Status: DC

## 2016-02-19 NOTE — Telephone Encounter (Signed)
Rx request sent to pharmacy.  

## 2016-03-28 DIAGNOSIS — Z23 Encounter for immunization: Secondary | ICD-10-CM | POA: Diagnosis not present

## 2016-04-20 DIAGNOSIS — B029 Zoster without complications: Secondary | ICD-10-CM | POA: Diagnosis not present

## 2016-08-11 ENCOUNTER — Telehealth: Payer: Self-pay | Admitting: Cardiovascular Disease

## 2016-08-11 DIAGNOSIS — E7849 Other hyperlipidemia: Secondary | ICD-10-CM

## 2016-08-11 DIAGNOSIS — Z79899 Other long term (current) drug therapy: Secondary | ICD-10-CM

## 2016-08-11 NOTE — Telephone Encounter (Signed)
Spoke w Raiann at [facility] - was not able to reach patient at listed number.   Raiann states the patient had come in for previsit labs before seeing Dr. Gwenlyn Found. There are no recent encounters and no appt made. Last labs 1+ yr ago, last OV 10/2015.  Patient needs appt in May for 1 yr f/u. She's voiced that she is going to get in touch with this patient today & will let her know to contact us for further guidance.

## 2016-08-11 NOTE — Telephone Encounter (Signed)
New message      Pt is at [facility] lab (downstairs) but they do not have an order.  Please call

## 2016-08-11 NOTE — Telephone Encounter (Signed)
Unable to reach [facility], line busy. No recent orders or recall visits in system.

## 2016-08-11 NOTE — Telephone Encounter (Signed)
Fasting Lipid and liver profile prior to seeing me

## 2016-08-11 NOTE — Telephone Encounter (Signed)
Left msg to call. I have placed orders for fasting labwork - released to [facility].

## 2016-08-11 NOTE — Telephone Encounter (Signed)
Follow Up   Pt calling back, requests call back

## 2016-08-11 NOTE — Telephone Encounter (Signed)
Pt advised on repeat fasting labwork instructions - will go to [facility] prior to next OV.

## 2016-08-11 NOTE — Telephone Encounter (Signed)
Reviewed, no recent labwork on file. (Last in Epic are lipid and liver 1+ yr ago.) Returning for yearly OV.  Please advise on pre visit labs for upcoming visit or if she can get labs day of visit.

## 2016-08-11 NOTE — Telephone Encounter (Signed)
New Message  Pt voiced came in today for labs but no order.  Pt has been scheduled and now pt needs order for labs.  Please f/u with pt for scheduling labs

## 2016-09-07 IMAGING — MR MR LUMBAR SPINE W/O CM
5 series · 40 of 48 positions shown · non-contrast
Comparison: None.

CLINICAL DATA: 68-year-old female with gradual onset of low back
pain with right leg radicular pain. Right lower extremity pain and
weakness which is increasing x4 months. No known injury. Initial
encounter.

EXAM:
MRI LUMBAR SPINE WITHOUT CONTRAST
TECHNIQUE: Multiplanar, multisequence MR imaging of the lumbar spine was
performed. No intravenous contrast was administered.

[Series 3: T2 post-contrast · sagittal · 4.0mm · 0.88mm/px · 6 of 12 slices shown]
[im 1/12]
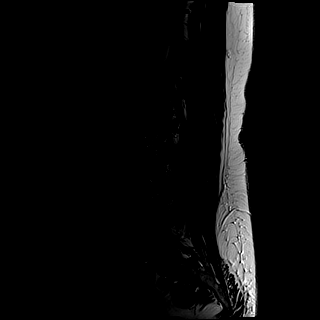
[im 3/12]
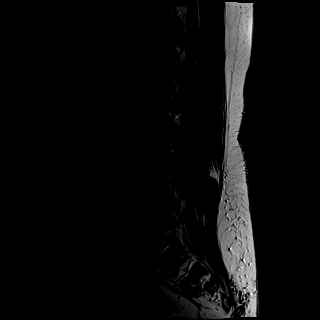
[im 5/12]
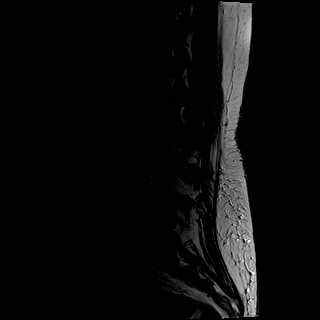
[im 7/12]
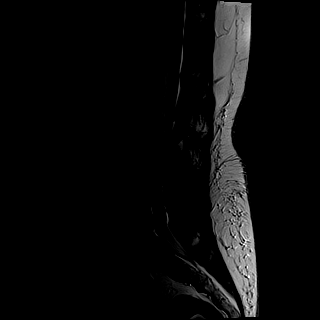
[im 9/12]
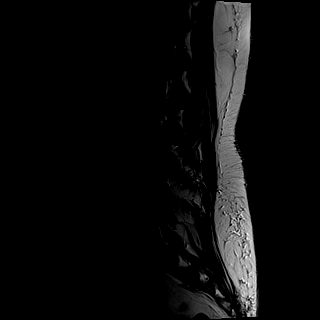
[im 12/12]
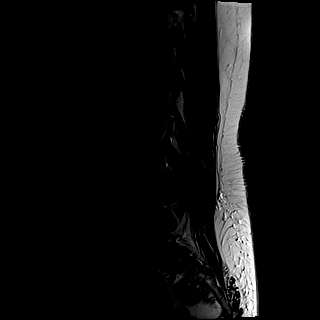

[Series 4: T1 · sagittal · 4.0mm · 0.88mm/px · 6 of 12 slices shown (1 of 2)]
[im 1/12]
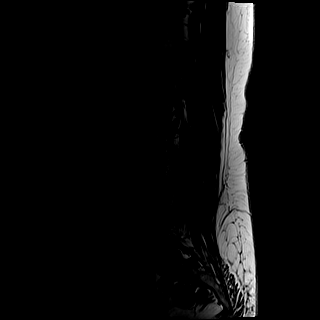
[im 3/12]
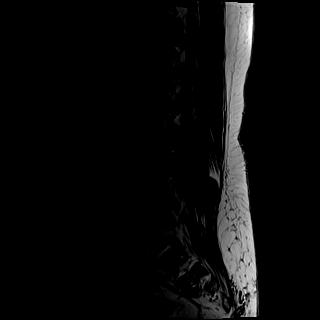
[im 5/12]
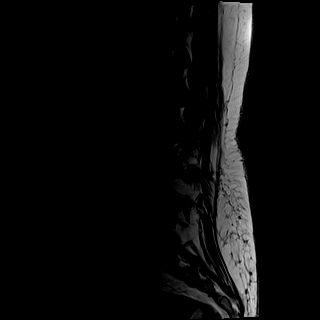
[im 7/12]
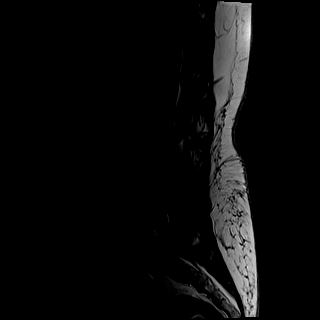
[im 9/12]
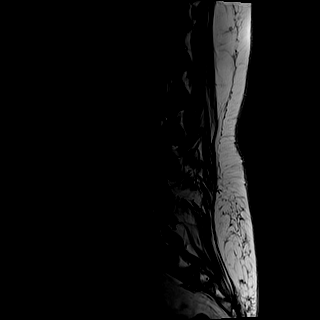
[im 12/12]
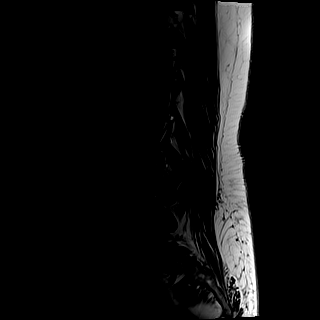

[Series 5: T1 · axial · 4.0mm · 0.74mm/px · z∈[-61,+141]mm · 9 of 30 slices shown (2 of 2)]
[im 1/30]
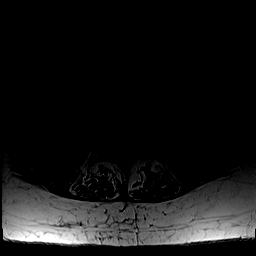
[im 5/30]
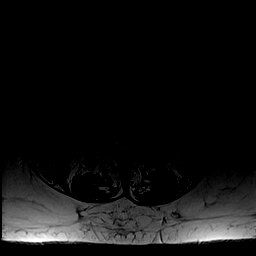
[im 9/30]
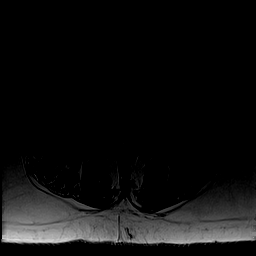
[im 13/30]
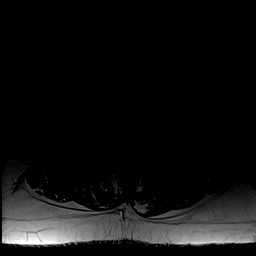
[im 15/30]
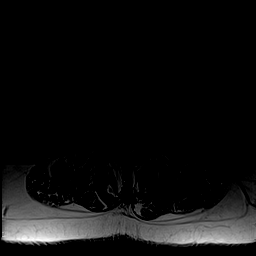
[im 17/30]
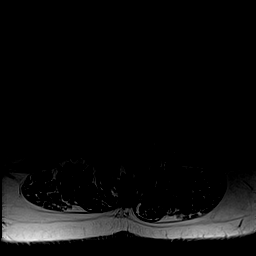
[im 21/30]
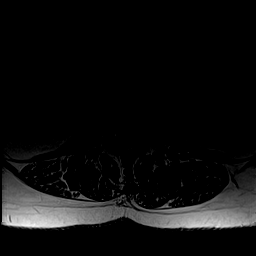
[im 25/30]
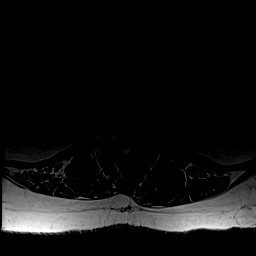
[im 30/30]
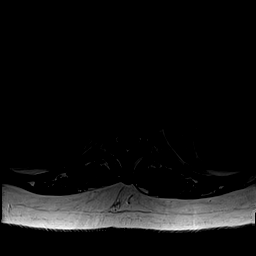

[Series 6: STIR · sagittal · 4.0mm · 0.55mm/px · 6 of 12 slices shown]
[im 1/12]
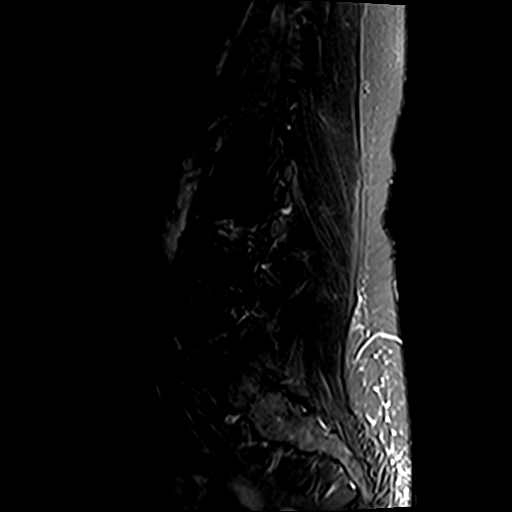
[im 3/12]
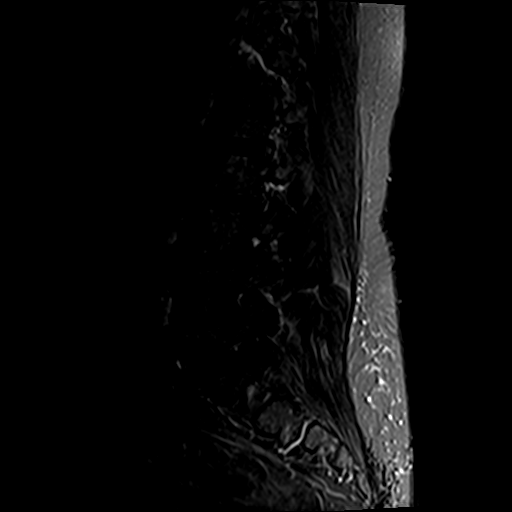
[im 5/12]
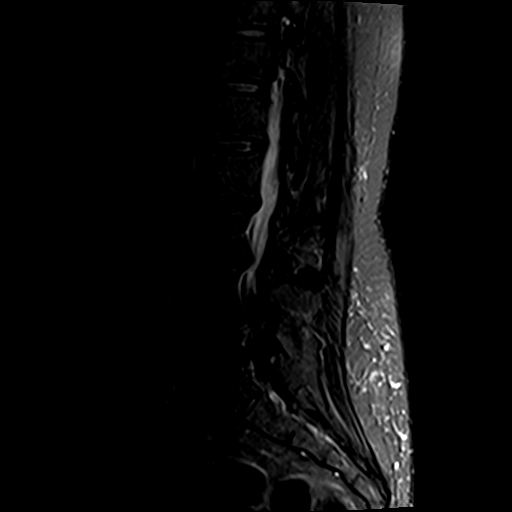
[im 7/12]
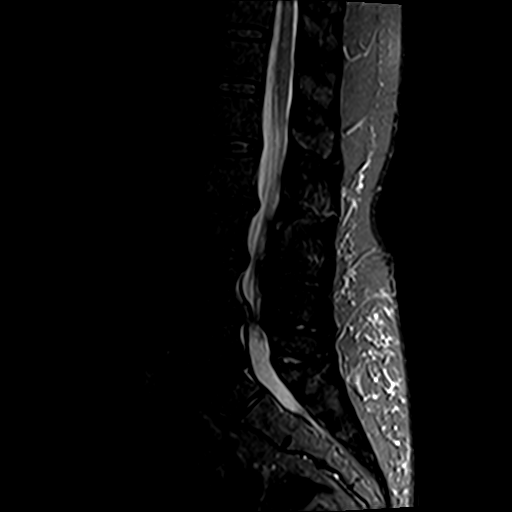
[im 9/12]
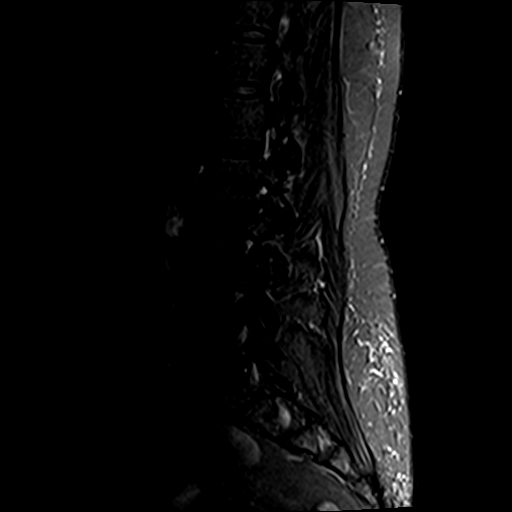
[im 12/12]
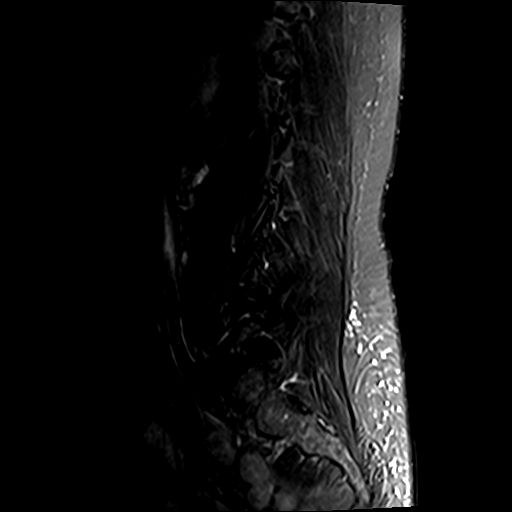

[Series 7: T2 · axial · 4.0mm · 0.74mm/px · z∈[-61,+141]mm · 13 of 30 slices shown]
[im 1/30]
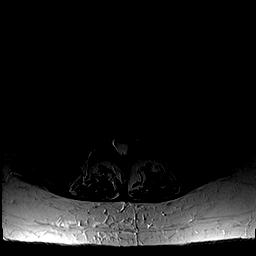
[im 3/30]
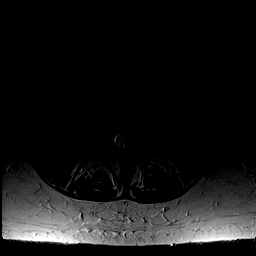
[im 5/30]
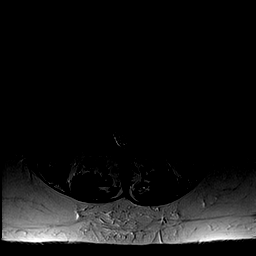
[im 7/30]
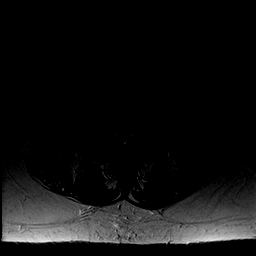
[im 9/30]
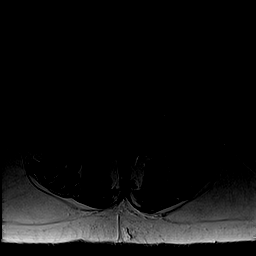
[im 11/30]
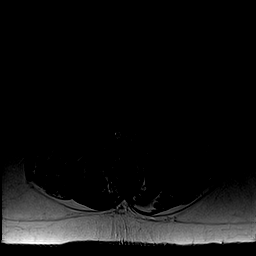
[im 13/30]
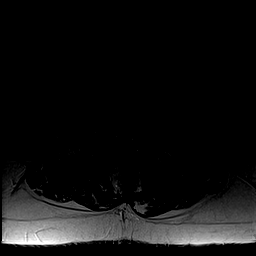
[im 15/30]
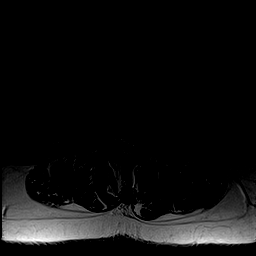
[im 17/30]
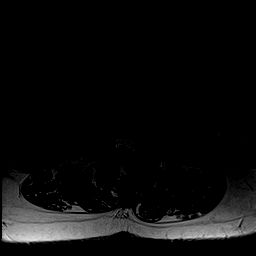
[im 19/30]
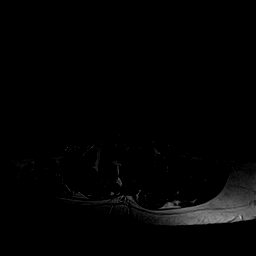
[im 21/30]
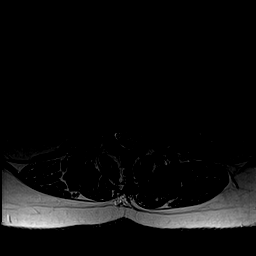
[im 25/30]
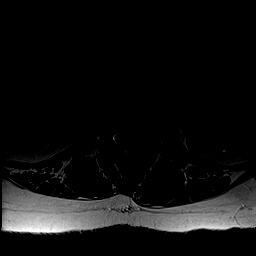
[im 30/30]
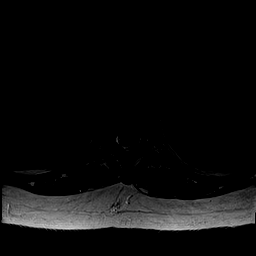

[40 of 48 positions shown; findings below may reference images not displayed]

FINDINGS: Lumbar segmentation appears to be normal and will be designated as
such for this report. Straightening of lumbar lordosis with
multilevel mild retrolisthesis. Degenerative endplate marrow changes
most pronounced at L5-S1. Occasional superimposed benign vertebral
body hemangiomas (L4). No marrow edema or evidence of acute osseous
abnormality.

Visualized lower thoracic spinal cord is normal with conus medularis
at L1.

Multiple T2 hyperintense renal lesions, including a partially
visualized posterior exophytic into D on the left measuring at least
2 cm. The visualized portions of these appear simple such as due to
benign cyst. Mild ectasia of the abdominal aorta and iliac arteries.
Negative visualized posterior paraspinal soft tissues.

T11-T12:  Negative.

T12-L1:  Negative.

L1-L2: Mild disc desiccation and circumferential disc bulge.
Superimposed left foraminal disc protrusion (series 7, image 5).
Posterior mass effect on the exiting left L1 nerve. No spinal or
right foraminal stenosis.

L2-L3: Disc desiccation, disc space loss and left eccentric
circumferential disc bulge. No significant spinal or lateral recess
stenosis. Mild endplate osteophytosis. Borderline to mild L2
foraminal stenosis.

L3-L4: Disc desiccation and left eccentric circumferential disc
bulge. Mild facet and ligament flavum hypertrophy. Mild to moderate
left lateral recess stenosis. Borderline to mild spinal stenosis.
Mild bilateral L3 foraminal stenosis.

L4-L5: Disc desiccation and circumferentially a disc desiccation and
fairly bulky circumferential disc bulge. Superimposed right
paracentral disc extrusion best seen on series 3, image 6 and series
7, image 22. Moderate to severe facet and mild to moderate ligament
flavum hypertrophy. Severe right lateral recess stenosis (descending
right L5 nerve root level), severe spinal stenosis, moderate left
and mild right L4 foraminal stenosis.

L5-S1: Severe disc space loss and bulky circumferential disc
osteophyte complex, with bilateral far lateral components
predominating. Moderate facet hypertrophy. No spinal stenosis.
Borderline to mild left lateral recess stenosis. Severe bilateral L5
foraminal stenosis.
IMPRESSION: 1. Symptomatic abnormality favored to be rightward disc extrusion at
L4-L5 severely affecting the descending right L5 nerve roots in the
right lateral recess, and contributing to severe spinal and moderate
left greater than right L4 foraminal stenosis.
2. Chronic disc, endplate, and posterior element degeneration
elsewhere in the lumbar spine, but with mostly left side
disproportionate neural impingement elsewhere. See details above.

## 2016-09-14 ENCOUNTER — Other Ambulatory Visit: Payer: Self-pay | Admitting: Cardiovascular Disease

## 2016-09-14 DIAGNOSIS — E784 Other hyperlipidemia: Secondary | ICD-10-CM | POA: Diagnosis not present

## 2016-09-14 DIAGNOSIS — Z79899 Other long term (current) drug therapy: Secondary | ICD-10-CM | POA: Diagnosis not present

## 2016-09-14 LAB — LIPID PANEL
Cholesterol: 120 mg/dL (ref <200)
HDL: 36 mg/dL — ABNORMAL LOW (ref >50)
LDL CALC: 30 mg/dL (ref <100)
Total CHOL/HDL Ratio: 3.3 Ratio (ref <5.0)
Triglycerides: 272 mg/dL — ABNORMAL HIGH (ref <150)
VLDL: 54 mg/dL — ABNORMAL HIGH (ref <30)

## 2016-09-14 LAB — HEPATIC FUNCTION PANEL
ALBUMIN: 3.8 g/dL (ref 3.6–5.1)
ALK PHOS: 123 U/L (ref 33–130)
ALT: 17 U/L (ref 6–29)
AST: 10 U/L (ref 10–35)
BILIRUBIN INDIRECT: 0.3 mg/dL (ref 0.2–1.2)
BILIRUBIN TOTAL: 0.4 mg/dL (ref 0.2–1.2)
Bilirubin, Direct: 0.1 mg/dL (ref <=0.2)
Total Protein: 6.1 g/dL (ref 6.1–8.1)

## 2016-09-20 ENCOUNTER — Telehealth: Payer: Self-pay | Admitting: Cardiovascular Disease

## 2016-09-20 NOTE — Telephone Encounter (Signed)
Returning your call,concerning her lab results. °

## 2016-09-20 NOTE — Telephone Encounter (Signed)
Returning your call from today. °

## 2016-09-20 NOTE — Telephone Encounter (Signed)
Lipid panel  Order: [id number]  Status:  Final result Visible to patient:  No (Not Released)  Notes recorded by Therisa Doyne on 09/20/2016 at 2:35 PM EDT lmtcb for results. ------  Notes recorded by Lorretta Harp, MD on 09/15/2016 at 7:24 AM EDT Excellent FLP except for mod elevated Trig prob diet related    Ref

## 2016-09-30 DIAGNOSIS — H20011 Primary iridocyclitis, right eye: Secondary | ICD-10-CM | POA: Diagnosis not present

## 2016-10-03 DIAGNOSIS — H20011 Primary iridocyclitis, right eye: Secondary | ICD-10-CM | POA: Diagnosis not present

## 2016-10-04 ENCOUNTER — Encounter: Payer: Self-pay | Admitting: Cardiovascular Disease

## 2016-10-04 ENCOUNTER — Ambulatory Visit (INDEPENDENT_AMBULATORY_CARE_PROVIDER_SITE_OTHER): Payer: Medicare Other | Admitting: Cardiovascular Disease

## 2016-10-04 VITALS — BP 148/74 | HR 68 | Ht 62.0 in | Wt 185.6 lb

## 2016-10-04 DIAGNOSIS — I2583 Coronary atherosclerosis due to lipid rich plaque: Secondary | ICD-10-CM | POA: Diagnosis not present

## 2016-10-04 DIAGNOSIS — E7800 Pure hypercholesterolemia, unspecified: Secondary | ICD-10-CM

## 2016-10-04 DIAGNOSIS — Z720 Tobacco use: Secondary | ICD-10-CM | POA: Insufficient documentation

## 2016-10-04 DIAGNOSIS — I2510 Atherosclerotic heart disease of native coronary artery without angina pectoris: Secondary | ICD-10-CM | POA: Diagnosis not present

## 2016-10-04 MED ORDER — CLOPIDOGREL BISULFATE 75 MG PO TABS: 75.0000 mg | ORAL_TABLET | Freq: Every day | ORAL | 3 refills | Status: DC

## 2016-10-04 MED ORDER — CARVEDILOL 3.125 MG PO TABS: 3.1250 mg | ORAL_TABLET | Freq: Two times a day (BID) | ORAL | 3 refills | Status: DC

## 2016-10-04 MED ORDER — ATORVASTATIN CALCIUM 80 MG PO TABS: 80.0000 mg | ORAL_TABLET | Freq: Every day | ORAL | 3 refills | Status: DC

## 2016-10-04 MED ORDER — LISINOPRIL 2.5 MG PO TABS: 2.5000 mg | ORAL_TABLET | Freq: Every day | ORAL | 3 refills | Status: DC

## 2016-10-04 NOTE — Patient Instructions (Signed)
Your physician recommends that you schedule a follow-up appointment in: 1 year with Dr. Gwenlyn Found.

## 2016-10-04 NOTE — Assessment & Plan Note (Signed)
History of ongoing tobacco abuse of one half pack per day recalcitrant to risk factor modification 

## 2016-10-04 NOTE — Assessment & Plan Note (Signed)
History of hyperlipidemia on statin therapy with recent lipid profile performed 09/14/16 revealing total cholesterol 120, LDL of 30 and HDL of 36.

## 2016-10-04 NOTE — Progress Notes (Signed)
10/04/2016 Terri [name]   Mar 18, 1946  [id number]  Primary Physician No PCP Per Patient Primary Cardiologist: Lorretta Harp MD Renae Gloss  HPI:  Terri [name] is a very pleasant 71 year old mildly overweight married Caucasian female mother of 2 children whose husband Terri [name] is also a patient of mine. I last saw her in the office 10/23/15. Her only cardiac risk factors include 25 pack years of tobacco abuse currently smoking one half pack per day. She has actually strong family history heart disease with both parents and multiple siblings with either died or had-related issues. She is retired Insurance claims handler. Since I saw her in the office over 2 years ago she has suffered an anterior wall myocardial infarction on 01/31/15 while at [facility]. She was taken to [facility] where she was found to have an occluded proximal LAD, occluded marginal branch and high-grade proximal RCA stenosis. She was cathed radially by Dr. Cecilio Asper. She had to synergy drug-eluting stents placed in an overlapping fashion in the proximal LAD and her proximal RCA. Her EF at that time was 25% which is felt clinically improved to 40-45% by 2-D echo 03/03/15 with an anteroapical wall motion abnormality. She does continue to smoke 3 cigarettes . Since I saw her year ago she's remained clinically stable Chest pain or shortness of breath.   Current Outpatient Prescriptions  Medication Sig Dispense Refill  . acetaminophen (TYLENOL) 325 MG tablet Take 650 mg by mouth every 6 (six) hours as needed for mild pain.    Marland Kitchen atorvastatin (LIPITOR) 80 MG tablet Take 1 tablet (80 mg total) by mouth daily. 30 tablet 11  . carvedilol (COREG) 3.125 MG tablet Take 1 tablet (3.125 mg total) by mouth 2 (two) times daily. 60 tablet 11  . clopidogrel (PLAVIX) 75 MG tablet Take 1 tablet (75 mg total) by mouth daily. 30 tablet 11  . [facility] ASPIRIN 81 MG EC tablet Take 81 mg by mouth daily.  0  .  dextromethorphan 7.5 MG/5ML SYRP Take 7.5 mg by mouth every 6 (six) hours as needed.    Marland Kitchen lisinopril (PRINIVIL,ZESTRIL) 2.5 MG tablet Take 1 tablet (2.5 mg total) by mouth daily. 30 tablet 11   No current facility-administered medications for this visit.     Allergies  Allergen Reactions  . Erythromycin     nauseated    Social History   Social History  . Marital status: Married    Spouse name: N/A  . Number of children: N/A  . Years of education: N/A   Occupational History  . Not on file.   Social History Main Topics  . Smoking status: Heavy Tobacco Smoker    Packs/day: 0.50    Years: 50.00    Types: Cigarettes  . Smokeless tobacco: Never Used  . Alcohol use No  . Drug use: No  . Sexual activity: Yes    Birth control/ protection: Surgical   Other Topics Concern  . Not on file   Social History Narrative  . No narrative on file     Review of Systems: General: negative for chills, fever, night sweats or weight changes.  Cardiovascular: negative for chest pain, dyspnea on exertion, edema, orthopnea, palpitations, paroxysmal nocturnal dyspnea or shortness of breath Dermatological: negative for rash Respiratory: negative for cough or wheezing Urologic: negative for hematuria Abdominal: negative for nausea, vomiting, diarrhea, bright red blood per rectum, melena, or hematemesis Neurologic: negative for visual changes, syncope, or dizziness All  other systems reviewed and are otherwise negative except as noted above.    Blood pressure (!) 148/74, pulse 68, height 5\' 2"  (1.575 m), weight 185 lb 9.6 oz (84.2 kg).  General appearance: alert and no distress Neck: no adenopathy, no carotid bruit, no JVD, supple, symmetrical, trachea midline and thyroid not enlarged, symmetric, no tenderness/mass/nodules Lungs: clear to auscultation bilaterally Heart: regular rate and rhythm, S1, S2 normal, no murmur, click, rub or gallop Extremities: extremities normal, atraumatic, no  cyanosis or edema  EKG sinus rhythm at 68 with left axis deviation and evidence of an anterolateral myocardial infarction remotely. I personally reviewed this EKG  ASSESSMENT AND PLAN:   Hyperlipidemia History of hyperlipidemia on statin therapy with recent lipid profile performed 09/14/16 revealing total cholesterol 120, LDL of 30 and HDL of 36.  Coronary artery disease due to lipid rich plaque History of CAD status post acute anterior wall myocardial infarction 01/31/15 while at [facility]. She was taken urgently to [facility] where she underwent cardiac catheterization revealing a occluded proximal LAD, marginal branch and high-grade proximal RCA stenosis all of which were stented radially by Dr. Cecilio Asper using synergy drug-eluting stents. Her initial EF was in the 25% range which improved by 2-D echo up to 40-45% with an apical wall motion abnormality. She denies chest pain or shortness of breath.  Tobacco abuse History of ongoing tobacco abuse of one half pack per day recalcitrant to risk factor modification.      Lorretta Harp MD FACP,FACC,FAHA, [facility] 10/04/2016 10:19 AM

## 2016-10-04 NOTE — Assessment & Plan Note (Signed)
History of CAD status post acute anterior wall myocardial infarction 01/31/15 while at [facility]. She was taken urgently to [facility] where she underwent cardiac catheterization revealing a occluded proximal LAD, marginal branch and high-grade proximal RCA stenosis all of which were stented radially by Dr. Cecilio Asper using synergy drug-eluting stents. Her initial EF was in the 25% range which improved by 2-D echo up to 40-45% with an apical wall motion abnormality. She denies chest pain or shortness of breath.

## 2016-10-06 NOTE — Telephone Encounter (Signed)
lmtcb

## 2016-10-10 DIAGNOSIS — H20011 Primary iridocyclitis, right eye: Secondary | ICD-10-CM | POA: Diagnosis not present

## 2016-10-13 ENCOUNTER — Encounter: Payer: Self-pay | Admitting: Cardiovascular Disease

## 2017-08-25 ENCOUNTER — Emergency Department (HOSPITAL_BASED_OUTPATIENT_CLINIC_OR_DEPARTMENT_OTHER)
Admit: 2017-08-25 | Discharge: 2017-08-25 | Disposition: A | Discharge status: Home / Self Care | Payer: Medicare Other | Attending: Emergency Medicine | Admitting: Emergency Medicine

## 2017-08-25 ENCOUNTER — Emergency Department (HOSPITAL_COMMUNITY): Payer: Medicare Other

## 2017-08-25 ENCOUNTER — Encounter (HOSPITAL_COMMUNITY): Payer: Self-pay | Admitting: Emergency Medicine

## 2017-08-25 ENCOUNTER — Emergency Department (HOSPITAL_COMMUNITY)
Admission: EM | Admit: 2017-08-25 | Discharge: 2017-08-25 | Disposition: A | Discharge status: Home / Self Care | Payer: Medicare Other | Attending: Emergency Medicine | Admitting: Emergency Medicine

## 2017-08-25 DIAGNOSIS — Z79899 Other long term (current) drug therapy: Secondary | ICD-10-CM | POA: Insufficient documentation

## 2017-08-25 DIAGNOSIS — F17210 Nicotine dependence, cigarettes, uncomplicated: Secondary | ICD-10-CM | POA: Diagnosis not present

## 2017-08-25 DIAGNOSIS — M545 Low back pain: Secondary | ICD-10-CM | POA: Diagnosis not present

## 2017-08-25 DIAGNOSIS — R1032 Left lower quadrant pain: Secondary | ICD-10-CM | POA: Insufficient documentation

## 2017-08-25 DIAGNOSIS — Z7902 Long term (current) use of antithrombotics/antiplatelets: Secondary | ICD-10-CM | POA: Insufficient documentation

## 2017-08-25 DIAGNOSIS — M79609 Pain in unspecified limb: Secondary | ICD-10-CM | POA: Diagnosis not present

## 2017-08-25 DIAGNOSIS — R2242 Localized swelling, mass and lump, left lower limb: Secondary | ICD-10-CM | POA: Insufficient documentation

## 2017-08-25 DIAGNOSIS — Z9049 Acquired absence of other specified parts of digestive tract: Secondary | ICD-10-CM | POA: Diagnosis not present

## 2017-08-25 DIAGNOSIS — R1030 Lower abdominal pain, unspecified: Secondary | ICD-10-CM | POA: Diagnosis not present

## 2017-08-25 DIAGNOSIS — I2510 Atherosclerotic heart disease of native coronary artery without angina pectoris: Secondary | ICD-10-CM | POA: Insufficient documentation

## 2017-08-25 DIAGNOSIS — M25552 Pain in left hip: Secondary | ICD-10-CM | POA: Diagnosis not present

## 2017-08-25 LAB — BASIC METABOLIC PANEL
ANION GAP: 9 (ref 5–15)
BUN: 11 mg/dL (ref 6–20)
CO2: 23 mmol/L (ref 22–32)
Calcium: 8.8 mg/dL — ABNORMAL LOW (ref 8.9–10.3)
Chloride: 105 mmol/L (ref 101–111)
Creatinine, Ser: 0.81 mg/dL (ref 0.44–1.00)
GLUCOSE: 87 mg/dL (ref 65–99)
POTASSIUM: 4.1 mmol/L (ref 3.5–5.1)
SODIUM: 137 mmol/L (ref 135–145)

## 2017-08-25 LAB — CBC
HCT: 39.8 % (ref 36.0–46.0)
Hemoglobin: 13.1 g/dL (ref 12.0–15.0)
MCH: 29.1 pg (ref 26.0–34.0)
MCHC: 32.9 g/dL (ref 30.0–36.0)
MCV: 88.4 fL (ref 78.0–100.0)
PLATELETS: 230 10*3/uL (ref 150–400)
RBC: 4.5 MIL/uL (ref 3.87–5.11)
RDW: 14.5 % (ref 11.5–15.5)
WBC: 10.5 10*3/uL (ref 4.0–10.5)

## 2017-08-25 MED ORDER — LISINOPRIL 2.5 MG PO TABS
2.5000 mg | ORAL_TABLET | Freq: Every day | ORAL | Status: DC
  Administered 2017-08-25: 2.5 mg via ORAL
  Filled 2017-08-25: qty 1

## 2017-08-25 MED ORDER — CARVEDILOL 3.125 MG PO TABS
3.1250 mg | ORAL_TABLET | Freq: Two times a day (BID) | ORAL | Status: DC
  Filled 2017-08-25: qty 1

## 2017-08-25 MED ORDER — OXYCODONE-ACETAMINOPHEN 5-325 MG PO TABS: 1.0000 | ORAL_TABLET | Freq: Four times a day (QID) | ORAL | 0 refills | Status: DC | PRN

## 2017-08-25 MED ORDER — HYDROMORPHONE HCL 1 MG/ML IJ SOLN
0.5000 mg | Freq: Once | INTRAMUSCULAR | Status: DC
  Filled 2017-08-25: qty 1

## 2017-08-25 MED ORDER — ONDANSETRON 4 MG PO TBDP
4.0000 mg | ORAL_TABLET | Freq: Once | ORAL | Status: AC
  Administered 2017-08-25: 4 mg via ORAL
  Filled 2017-08-25: qty 1

## 2017-08-25 MED ORDER — OXYCODONE-ACETAMINOPHEN 5-325 MG PO TABS
1.0000 | ORAL_TABLET | Freq: Once | ORAL | Status: AC
  Administered 2017-08-25: 1 via ORAL
  Filled 2017-08-25: qty 1

## 2017-08-25 NOTE — ED Triage Notes (Signed)
Pt told to come to ED from urgent care for further evaluation of L groin pain. Patient states she started having lower back pain a couple weeks ago that radiated to L buttock, then to L hip and now it is mostly in her L groin/hip. Patient has not been seen by a PCP in a while. On Plavix and BP medication, but states she did not take her medication today. Pt reports pain is mild at rest, but is severe when getting out of a chair. When waking up, pain is at its worse and it takes 15 minutes to get going each day. Denies CP/SOB. Had acute MI 2.5 years ago. Some swelling to LLE compared to RLE. Ambulatory in traige with slow gait. Pedal pulses moderate and equal.

## 2017-08-25 NOTE — ED Notes (Addendum)
Wasted the 0.5 with Sharrie Rothman, RN. SINCE PT REFUSED THE MEDICATION

## 2017-08-25 NOTE — ED Provider Notes (Signed)
[place] EMERGENCY DEPARTMENT Provider Note   CSN: [id number] Arrival date & time: 08/25/17  1542     History   Chief Complaint Chief Complaint  Patient presents with  . Groin Pain  . Leg Swelling    HPI Terri [name] is a 72 y.o. female.  Patient complains of left groin pain.  Patient has no history of trauma  The history is provided by the patient. No language interpreter was used.  Groin Pain  This is a new problem. The current episode started more than 2 days ago. The problem occurs constantly. The problem has not changed since onset.Pertinent negatives include no chest pain, no abdominal pain and no headaches. The symptoms are aggravated by bending. Nothing relieves the symptoms. She has tried acetaminophen for the symptoms.    Past Medical History:  Diagnosis Date  . Aortic insufficiency    a. Mild AI by echo 2014.  . Arthritis   . CAD (coronary artery disease)    a. Anterior STEMI 01/2015 s/p overlapping DES x2 to prox LAD, DES to prox RCA in [facility].  . Chronic back pain    stenosis/HNP  . History of bronchitis 2014  . History of colon polyps   . Hyperlipidemia   . Ischemic cardiomyopathy   . Joint pain   . Joint swelling   . Pneumonia 2014  . Smokers' cough ([place])   . Stress headaches   . Tobacco abuse     Patient Active Problem List   Diagnosis Date Noted  . Tobacco abuse 10/04/2016  . Coronary artery disease due to lipid rich plaque 04/28/2015  . S/P lumbar discectomy 07/25/2014  . Hyperlipidemia 01/16/2013  . Chest pain 01/10/2013  . Family history of heart disease 01/10/2013    Past Surgical History:  Procedure Laterality Date  . ABDOMINAL HYSTERECTOMY     at age 43  . cataract surgery Bilateral   . CHOLECYSTECTOMY    . colonscopy    . ERCP    . ESOPHAGOGASTRODUODENOSCOPY    . LUMBAR LAMINECTOMY Right 07/25/2014   Procedure: Right L4 Hemilaminectomy, Right L4-5 Laminotomy , Decompression,  Microdisectomy;  Surgeon: Marybelle Killings, MD;  Location: [place];  Service: Orthopedics;  Laterality: Right;  . SHOULDER SURGERY Right   . TONSILLECTOMY     as a child     OB History   None      Home Medications    Prior to Admission medications   Medication Sig Start Date End Date Taking? Authorizing Provider  acetaminophen (TYLENOL) 325 MG tablet Take 650 mg by mouth every 6 (six) hours as needed for mild pain.    [provider]  atorvastatin (LIPITOR) 80 MG tablet Take 1 tablet (80 mg total) by mouth daily. 10/04/16   Lorretta Harp, MD  carvedilol (COREG) 3.125 MG tablet Take 1 tablet (3.125 mg total) by mouth 2 (two) times daily. 10/04/16   Lorretta Harp, MD  clopidogrel (PLAVIX) 75 MG tablet Take 1 tablet (75 mg total) by mouth daily. 10/04/16   Lorretta Harp, MD  [facility] ASPIRIN 81 MG EC tablet Take 81 mg by mouth daily. 02/02/15   [provider]  dextromethorphan 7.5 MG/5ML SYRP Take 7.5 mg by mouth every 6 (six) hours as needed.    [provider]  lisinopril (PRINIVIL,ZESTRIL) 2.5 MG tablet Take 1 tablet (2.5 mg total) by mouth daily. 10/04/16   Lorretta Harp, MD  oxyCODONE-acetaminophen (PERCOCET) 5-325 MG  tablet Take 1 tablet by mouth every 6 (six) hours as needed. 08/25/17   Milton Ferguson, MD    Family History Family History  Problem Relation Age of Onset  . Heart disease Mother   . Heart failure Mother   . Heart disease Father   . Heart failure Father   . CAD Unknown        both parents and multiple siblings with either died or had-related issues    Social History Social History   Tobacco Use  . Smoking status: Current Every Day Smoker    Packs/day: 0.50    Years: 50.00    Pack years: 25.00    Types: Cigarettes  . Smokeless tobacco: Never Used  Substance Use Topics  . Alcohol use: No  . Drug use: No     Allergies   Erythromycin   Review of Systems Review of Systems  Constitutional: Negative for appetite change and  fatigue.  HENT: Negative for congestion, ear discharge and sinus pressure.   Eyes: Negative for discharge.  Respiratory: Negative for cough.   Cardiovascular: Negative for chest pain.  Gastrointestinal: Negative for abdominal pain and diarrhea.  Genitourinary: Negative for frequency and hematuria.       Left groin pain  Musculoskeletal: Negative for back pain.  Skin: Negative for rash.  Neurological: Negative for seizures and headaches.  Psychiatric/Behavioral: Negative for hallucinations.     Physical Exam Updated Vital Signs BP (!) 149/67   Pulse (!) 59   Temp (!) 97.5 F (36.4 C) (Oral)   Resp 16   Ht 5' 2.5" (1.588 m)   Wt 84.8 kg (187 lb)   SpO2 95%   BMI 33.66 kg/m   Physical Exam  Constitutional: She is oriented to person, place, and time. She appears well-developed.  HENT:  Head: Normocephalic.  Eyes: Conjunctivae and EOM are normal. No scleral icterus.  Neck: Neck supple. No thyromegaly present.  Cardiovascular: Normal rate and regular rhythm. Exam reveals no gallop and no friction rub.  No murmur heard. Pulmonary/Chest: No stridor. She has no wheezes. She has no rales. She exhibits no tenderness.  Abdominal: She exhibits no distension. There is no tenderness. There is no rebound.  Tender left groin  Musculoskeletal: Normal range of motion. She exhibits no edema.  Lymphadenopathy:    She has no cervical adenopathy.  Neurological: She is oriented to person, place, and time. She exhibits normal muscle tone. Coordination normal.  Skin: No rash noted. No erythema.  Psychiatric: She has a normal mood and affect. Her behavior is normal.  Nursing note and vitals reviewed.    ED Treatments / Results  Labs (all labs ordered are listed, but only abnormal results are displayed) Labs Reviewed  BASIC METABOLIC PANEL - Abnormal; Notable for the following components:      Result Value   Calcium 8.8 (*)    All other components within normal limits  CBC     EKG None  Radiology Dg Lumbar Spine Complete  Result Date: 08/25/2017 CLINICAL DATA:  Lower back and LEFT hip pain for 3 weeks EXAM: LUMBAR SPINE - COMPLETE 4+ VIEW COMPARISON:  MRI lumbar spine 06/20/2014 FINDINGS: Osseous demineralization. Five non-rib-bearing lumbar vertebra. Vertebral body heights maintained without fracture or subluxation. Multilevel disc space narrowing and endplate spur formation. Mild facet degenerative changes lower lumbar spine. No bone destruction or spondylolysis. SI joints preserved. Atherosclerotic calcifications aorta. IMPRESSION: Degenerative disc and facet disease changes of the cervical spine. No acute cervical spine abnormalities. Electronically Signed  By: Lavonia Dana M.D.   On: 08/25/2017 18:14   Dg Hip Unilat W Or Wo Pelvis 2-3 Views Left  Result Date: 08/25/2017 CLINICAL DATA:  Low back and left hip pain for 3 weeks. EXAM: DG HIP (WITH OR WITHOUT PELVIS) 2-3V LEFT COMPARISON:  None. FINDINGS: Mild degenerative changes are noted at the SI joints bilaterally. Joint spaces are preserved in the hips bilaterally. No acute or healing fracture is present. Aortic atherosclerosis is present. Calcifications extend into the iliac vessels bilaterally. IMPRESSION: 1. Mild degenerative changes at the SI joints bilaterally. 2. No acute or focal process in the hip. 3. Atherosclerosis. Electronically Signed   By: San Morelle M.D.   On: 08/25/2017 18:12    Procedures Procedures (including critical care time)  Medications Ordered in ED Medications  carvedilol (COREG) tablet 3.125 mg (has no administration in time range)  lisinopril (PRINIVIL,ZESTRIL) tablet 2.5 mg (2.5 mg Oral Given 08/25/17 1659)  HYDROmorphone (DILAUDID) injection 0.5 mg (has no administration in time range)  oxyCODONE-acetaminophen (PERCOCET/ROXICET) 5-325 MG per tablet 1 tablet (1 tablet Oral Given 08/25/17 1659)  ondansetron (ZOFRAN-ODT) disintegrating tablet 4 mg (4 mg Oral Given 08/25/17  1659)     Initial Impression / Assessment and Plan / ED Course  I have reviewed the triage vital signs and the nursing notes.  Pertinent labs & imaging results that were available during my care of the patient were reviewed by me and considered in my medical decision making (see chart for details).    X-rays including left hip lumbar spine along with Doppler study of the left leg are unremarkable.  Patient will be given some pain medicines referred to orthopedics   Final Clinical Impressions(s) / ED Diagnoses   Final diagnoses:  Groin pain, left    ED Discharge Orders        Ordered    oxyCODONE-acetaminophen (PERCOCET) 5-325 MG tablet  Every 6 hours PRN     08/25/17 2021       Milton Ferguson, MD 08/25/17 2026

## 2017-08-25 NOTE — ED Provider Notes (Signed)
Patient placed in Quick Look pathway, seen and evaluated   Chief Complaint: Left groin pain  HPI:   Patient with history of smoking, MI.  Since the emergency department chief complaint of left inguinal pain.  Patient states that she was cleaning about 3 days ago it started off by having pain in her low back afterward.  It wrapped around to her left hip and is now seated in the left inguinal region.  She states that the pain is sharp and severe and she states that it is worse when she is trying to get up from a lying position.  She states that getting out of bed is so excruciating it takes her almost 30 minutes because she takes her time to be able to stand back up without severe pain.  She has a history of injury.  She is also noticed some swelling and erythema in the left lower extremity  ROS: Hip pain one)  Physical Exam:   Gen: No distress  Neuro: Awake and Alert  Skin: Warm    Focused Exam:  Mild pain with internal/external rotation of the hip.  No palpable inguinal hernia (   Initiation of care has begun. The patient has been counseled on the process, plan, and necessity for staying for the completion/evaluation, and the remainder of the medical screening examination    Ned Grace 08/25/17 1654    Lacretia Leigh, MD 08/26/17 2218

## 2017-08-25 NOTE — Discharge Instructions (Addendum)
Rest as much as possible.  Use a walker to ambulate with.  Follow-up with your orthopedic doctor next week

## 2017-08-25 NOTE — Progress Notes (Signed)
Left lower extremity venous duplex has been completed. Negative for DVT. Results were given to Wyn Quaker PA.  08/25/17 5:44 PM Carlos Levering RVT

## 2017-08-30 DIAGNOSIS — J209 Acute bronchitis, unspecified: Secondary | ICD-10-CM | POA: Diagnosis not present

## 2017-08-30 DIAGNOSIS — J0100 Acute maxillary sinusitis, unspecified: Secondary | ICD-10-CM | POA: Diagnosis not present

## 2017-09-11 ENCOUNTER — Encounter: Payer: Self-pay | Admitting: Family Medicine

## 2017-09-11 ENCOUNTER — Ambulatory Visit (INDEPENDENT_AMBULATORY_CARE_PROVIDER_SITE_OTHER): Payer: Medicare Other | Admitting: Family Medicine

## 2017-09-11 VITALS — BP 128/80 | HR 85 | Ht 62.5 in | Wt 179.5 lb

## 2017-09-11 DIAGNOSIS — Z8701 Personal history of pneumonia (recurrent): Secondary | ICD-10-CM | POA: Insufficient documentation

## 2017-09-11 DIAGNOSIS — Z1231 Encounter for screening mammogram for malignant neoplasm of breast: Secondary | ICD-10-CM

## 2017-09-11 DIAGNOSIS — R252 Cramp and spasm: Secondary | ICD-10-CM | POA: Diagnosis not present

## 2017-09-11 DIAGNOSIS — Z23 Encounter for immunization: Secondary | ICD-10-CM | POA: Diagnosis not present

## 2017-09-11 DIAGNOSIS — Z1239 Encounter for other screening for malignant neoplasm of breast: Secondary | ICD-10-CM

## 2017-09-11 DIAGNOSIS — E2839 Other primary ovarian failure: Secondary | ICD-10-CM | POA: Insufficient documentation

## 2017-09-11 DIAGNOSIS — Z87898 Personal history of other specified conditions: Secondary | ICD-10-CM | POA: Diagnosis not present

## 2017-09-11 DIAGNOSIS — Z720 Tobacco use: Secondary | ICD-10-CM

## 2017-09-11 DIAGNOSIS — I2510 Atherosclerotic heart disease of native coronary artery without angina pectoris: Secondary | ICD-10-CM

## 2017-09-11 DIAGNOSIS — Z1211 Encounter for screening for malignant neoplasm of colon: Secondary | ICD-10-CM | POA: Insufficient documentation

## 2017-09-11 NOTE — Progress Notes (Signed)
Subjective:  Patient ID: Terri [name], female    DOB: 09-19-1945  Age: 72 y.o. MRN: [id number]  CC: Establish Care   HPI Terri [name] presents for establishment of care and for follow-up of her recent pneumonia.  She just finished up her antibiotics 3 days ago.  She is doing much better.  Cough is subsided with scant phlegm production she is no longer febrile.  She also ruptured her eardrum with the same illness.  She has had no further fevers or chills but her hearing remains impaired in the right ear.  She has a past medical history of ruptures of her right eardrum.  She continues to smoke cigarettes at about a half a pack per day pack a day.  She has smoked since she was 51.  She says that she is never had a pneumonia vaccines.  She has not had a mammogram in quite some time.  She did have a biopsy of a confirmed benign lesion some years ago.  She is status post hysterectomy for menorrhagia.  She has never had a DEXA scan.  She has had 2 or 3 colonoscopies in the last one was clean but it was some years ago.  She has leg cramps at night and she enjoys coffee and Diet Coke throughout the day.  She has a history of coronary artery disease and hypertension and she is followed by cardiology for these issues.  Her appointment with cardiology is in a few weeks.  History Terri [name] has a past medical history of Aortic insufficiency, Arthritis, CAD (coronary artery disease), Chronic back pain, History of bronchitis (2014), History of colon polyps, Hyperlipidemia, Ischemic cardiomyopathy, Joint pain, Joint swelling, Pneumonia (2014), Smokers' cough (Terri [name]), Stress headaches, and Tobacco abuse.   She has a past surgical history that includes Tonsillectomy; Abdominal hysterectomy; Shoulder surgery (Right); Cholecystectomy; colonscopy; Esophagogastroduodenoscopy; ERCP; cataract surgery (Bilateral); and Lumbar laminectomy (Right, 07/25/2014).   Her family history includes CAD in her unknown relative; Heart disease in  her father and mother; Heart failure in her father and mother.She reports that she has been smoking cigarettes.  She has a 25.00 pack-year smoking history. She has never used smokeless tobacco. She reports that she does not drink alcohol or use drugs.  Outpatient Medications Prior to Visit  Medication Sig Dispense Refill  . acetaminophen (TYLENOL) 325 MG tablet Take 650 mg by mouth every 6 (six) hours as needed for mild pain.    Marland Kitchen atorvastatin (LIPITOR) 80 MG tablet Take 1 tablet (80 mg total) by mouth daily. 90 tablet 3  . carvedilol (COREG) 3.125 MG tablet Take 1 tablet (3.125 mg total) by mouth 2 (two) times daily. 180 tablet 3  . clopidogrel (PLAVIX) 75 MG tablet Take 1 tablet (75 mg total) by mouth daily. 90 tablet 3  . [facility] ASPIRIN 81 MG EC tablet Take 81 mg by mouth daily.  0  . lisinopril (PRINIVIL,ZESTRIL) 2.5 MG tablet Take 1 tablet (2.5 mg total) by mouth daily. 90 tablet 3  . dextromethorphan 7.5 MG/5ML SYRP Take 7.5 mg by mouth every 6 (six) hours as needed.    Marland Kitchen oxyCODONE-acetaminophen (PERCOCET) 5-325 MG tablet Take 1 tablet by mouth every 6 (six) hours as needed. 20 tablet 0   No facility-administered medications prior to visit.     ROS Review of Systems  Constitutional: Negative for chills, fever and unexpected weight change.  HENT: Positive for ear pain and hearing loss. Negative for sore throat and trouble swallowing.   Eyes:  Negative for photophobia and visual disturbance.  Respiratory: Negative for cough and wheezing.   Cardiovascular: Negative for chest pain and leg swelling.  Gastrointestinal: Negative.  Negative for anal bleeding.  Endocrine: Negative for polyphagia and polyuria.  Genitourinary: Negative for difficulty urinating, frequency and hematuria.  Musculoskeletal: Positive for myalgias.  Skin: Negative.   Allergic/Immunologic: Negative for immunocompromised state.  Neurological: Negative for weakness and numbness.  Hematological: Does not bruise/bleed  easily.  Psychiatric/Behavioral: Negative.     Objective:  BP 128/80 (BP Location: Left Arm, Patient Position: Sitting, Cuff Size: Normal)   Pulse 85   Ht 5' 2.5" (1.588 m)   Wt 179 lb 8 oz (81.4 kg)   SpO2 95%   BMI 32.31 kg/m   Physical Exam  Constitutional: She is oriented to person, place, and time. She appears well-developed and well-nourished.  HENT:  Head: Normocephalic and atraumatic.  Right Ear: External ear normal. Tympanic membrane is scarred and perforated. Tympanic membrane is not injected and not erythematous. No hemotympanum.  Left Ear: External ear normal. Tympanic membrane is scarred. Tympanic membrane is not injected, not perforated and not erythematous. No hemotympanum.  Nose: Nose normal.  Mouth/Throat: Oropharynx is clear and moist.  Eyes: Pupils are equal, round, and reactive to light. Conjunctivae and EOM are normal. Right eye exhibits no discharge. Left eye exhibits no discharge.  Neck: No JVD present. No tracheal deviation present. No thyromegaly present.  Cardiovascular: Normal rate, regular rhythm and normal heart sounds.  Pulmonary/Chest: Effort normal and breath sounds normal.  Abdominal: Bowel sounds are normal.  Lymphadenopathy:    She has no cervical adenopathy.  Neurological: She is alert and oriented to person, place, and time.  Skin: Skin is warm and dry.  Psychiatric: She has a normal mood and affect. Her behavior is normal.      Assessment & Plan:   Terri [name] was seen today for establish care.  Diagnoses and all orders for this visit:  History of pneumonia  Tobacco abuse  Leg cramps  Coronary artery disease involving native heart, angina presence unspecified, unspecified vessel or lesion type  Screening for breast cancer -     MM Digital Screening; Future  History of abnormal mammogram -     MM Digital Screening; Future  Need for 23-polyvalent pneumococcal polysaccharide vaccine -     Pneumococcal polysaccharide vaccine  23-valent greater than or equal to 2yo subcutaneous/IM  Estrogen deficiency -     DG Bone Density; Future   I have discontinued Terri [name]'s dextromethorphan and oxyCODONE-acetaminophen. I am also having her maintain her [facility]S ASPIRIN, acetaminophen, lisinopril, clopidogrel, carvedilol, and atorvastatin.  No orders of the defined types were placed in this encounter.  Will schedule patient for mammogram and DEXA scan.  Encouraged her to drink plenty of fluids and avoid caffeinated fluids such as diet Cokes coffee.  We will follow-up her chest x-ray in 1 month.  Discussed obtaining a low-dose CT of her chest.  At some point we will need to send her for a colonoscopy.  She was started on the pneumonia vaccine series.  Follow-up: Return in about 1 month (around 10/11/2017).  Libby Maw, MD

## 2017-09-11 NOTE — Patient Instructions (Addendum)
Coping with Quitting Smoking Quitting smoking is a physical and mental challenge. You will face cravings, withdrawal symptoms, and temptation. Before quitting, work with your health care provider to make a plan that can help you cope. Preparation can help you quit and keep you from giving in. How can I cope with cravings? Cravings usually last for 5-10 minutes. If you get through it, the craving will pass. Consider taking the following actions to help you cope with cravings:  Keep your mouth busy: ? Chew sugar-free gum. ? Suck on hard candies or a straw. ? Brush your teeth.  Keep your hands and body busy: ? Immediately change to a different activity when you feel a craving. ? Squeeze or play with a ball. ? Do an activity or a hobby, like making bead jewelry, practicing needlepoint, or working with wood. ? Mix up your normal routine. ? Take a short exercise break. Go for a quick walk or run up and down stairs. ? Spend time in public places where smoking is not allowed.  Focus on doing something kind or helpful for someone else.  Call a friend or family member to talk during a craving.  Join a support group.  Call a quit line, such as 1-800-QUIT-NOW.  Talk with your health care provider about medicines that might help you cope with cravings and make quitting easier for you.  How can I deal with withdrawal symptoms? Your body may experience negative effects as it tries to get used to not having nicotine in the system. These effects are called withdrawal symptoms. They may include:  Feeling hungrier than normal.  Trouble concentrating.  Irritability.  Trouble sleeping.  Feeling depressed.  Restlessness and agitation.  Craving a cigarette.  To manage withdrawal symptoms:  Avoid places, people, and activities that trigger your cravings.  Remember why you want to quit.  Get plenty of sleep.  Avoid coffee and other caffeinated drinks. These may worsen some of your  symptoms.  How can I handle social situations? Social situations can be difficult when you are quitting smoking, especially in the first few weeks. To manage this, you can:  Avoid parties, bars, and other social situations where people might be smoking.  Avoid alcohol.  Leave right away if you have the urge to smoke.  Explain to your family and friends that you are quitting smoking. Ask for understanding and support.  Plan activities with friends or family where smoking is not an option.  What are some ways I can cope with stress? Wanting to smoke may cause stress, and stress can make you want to smoke. Find ways to manage your stress. Relaxation techniques can help. For example:  Breathe slowly and deeply, in through your nose and out through your mouth.  Listen to soothing, relaxing music.  Talk with a family member or friend about your stress.  Light a candle.  Soak in a bath or take a shower.  Think about a peaceful place.  What are some ways I can prevent weight gain? Be aware that many people gain weight after they quit smoking. However, not everyone does. To keep from gaining weight, have a plan in place before you quit and stick to the plan after you quit. Your plan should include:  Having healthy snacks. When you have a craving, it may help to: ? Eat plain popcorn, crunchy carrots, celery, or other cut vegetables. ? Chew sugar-free gum.  Changing how you eat: ? Eat small portion sizes at meals. ?  Eat 4-6 small meals throughout the day instead of 1-2 large meals a day. ? Be mindful when you eat. Do not watch television or do other things that might distract you as you eat.  Exercising regularly: ? Make time to exercise each day. If you do not have time for a long workout, do short bouts of exercise for 5-10 minutes several times a day. ? Do some form of strengthening exercise, like weight lifting, and some form of aerobic exercise, like running or  swimming.  Drinking plenty of water or other low-calorie or no-calorie drinks. Drink 6-8 glasses of water daily, or as much as instructed by your health care provider.  Summary  Quitting smoking is a physical and mental challenge. You will face cravings, withdrawal symptoms, and temptation to smoke again. Preparation can help you as you go through these challenges.  You can cope with cravings by keeping your mouth busy (such as by chewing gum), keeping your body and hands busy, and making calls to family, friends, or a helpline for people who want to quit smoking.  You can cope with withdrawal symptoms by avoiding places where people smoke, avoiding drinks with caffeine, and getting plenty of rest.  Ask your health care provider about the different ways to prevent weight gain, avoid stress, and handle social situations. This information is not intended to replace advice given to you by your health care provider. Make sure you discuss any questions you have with your health care provider. Document Released: 05/20/2016 Document Revised: 05/20/2016 Document Reviewed: 05/20/2016 Elsevier Interactive Patient Education  2018 [place] Screening A lung cancer screening is a test that checks for lung cancer. Lung cancer screening is done to look for lung cancer in its very early stages, before it spreads and becomes harder to treat and before symptoms appear. Finding cancer early improves the chances of successful treatment. It may save your life. Should I be screened for lung cancer? You should be screened for lung cancer if all of these apply:  You currently smoke or you have quit smoking within the past 15 years.  You are 12-36 years old. Screening may be recommended up to age 9 depending on your overall health and other factors.  You are in good general health.  You have a 30-pack-year smoking history.  To find your pack-year history, multiply how many packages of  cigarettes you smoked each day by the number of years you smoked. For example, if you smoked two packs of cigarettes each day for 15 years, your pack-year history is 30. If you are not sure what your pack-year smoking history is, ask your health care provider. Screening may also be recommended if you are at high risk for the disease. You may be at high risk if:  You have a family history of lung cancer.  You have been exposed to asbestos.  You have chronic obstructive pulmonary disease (COPD).  You have a history of previous lung cancer.  How often should I be screened for lung cancer? If you are at risk for lung cancer, it is recommended that you are screened once a year. The recommended screening test is a low-dose CT scan. How can I lower my risk of lung cancer? To lower your risk of developing lung cancer:  If you smoke, stop smoking all tobacco products.  Avoid secondhand smoke.  Avoid exposure to radiation.  Avoid exposure to radon gas. Have your home checked for radon regularly.  Avoid things  that cause cancer (carcinogens).  Avoid living or working in places with high air pollution.  Where to find more information: Ask your health care provider about the risks and benefits of screening. More information and resources are available from these organizations:  [place] (ACS): www.cancer.org  American Lung Association: www.lung.org  Contact a health care provider if:  You start to show symptoms of lung cancer, including: ? Coughing that will not go away. ? Wheezing. ? Chest pain. ? Coughing up blood. ? Shortness of breath. ? Weight loss that cannot be explained. ? Constant fatigue. Summary  Lung cancer screening may find lung cancer before symptoms appear. Finding cancer early improves the chances of successful treatment. It may save your life.  If you are at risk for lung cancer, it is recommended that you are screened once a year. The recommended  screening test is a low-dose CT scan.  You can make lifestyle changes to lower your risk of lung cancer.  Ask your health care provider about the risks and benefits of screening. This information is not intended to replace advice given to you by your health care provider. Make sure you discuss any questions you have with your health care provider. Document Released: 04/13/2016 Document Revised: 04/13/2016 Document Reviewed: 04/13/2016 Elsevier Interactive Patient Education  2018 [place] 65 Years and Older, Female Preventive care refers to lifestyle choices and visits with your health care provider that can promote health and wellness. What does preventive care include?  A yearly physical exam. This is also called an annual well check.  Dental exams once or twice a year.  Routine eye exams. Ask your health care provider how often you should have your eyes checked.  Personal lifestyle choices, including: ? Daily care of your teeth and gums. ? Regular physical activity. ? Eating a healthy diet. ? Avoiding tobacco and drug use. ? Limiting alcohol use. ? Practicing safe sex. ? Taking low-dose aspirin every day. ? Taking vitamin and mineral supplements as recommended by your health care provider. What happens during an annual well check? The services and screenings done by your health care provider during your annual well check will depend on your age, overall health, lifestyle risk factors, and family history of disease. Counseling Your health care provider may ask you questions about your:  Alcohol use.  Tobacco use.  Drug use.  Emotional well-being.  Home and relationship well-being.  Sexual activity.  Eating habits.  History of falls.  Memory and ability to understand (cognition).  Work and work Statistician.  Reproductive health.  Screening You may have the following tests or measurements:  Height, weight, and BMI.  Blood  pressure.  Lipid and cholesterol levels. These may be checked every 5 years, or more frequently if you are over 56 years old.  Skin check.  Lung cancer screening. You may have this screening every year starting at age 82 if you have a 30-pack-year history of smoking and currently smoke or have quit within the past 15 years.  Fecal occult blood test (FOBT) of the stool. You may have this test every year starting at age 42.  Flexible sigmoidoscopy or colonoscopy. You may have a sigmoidoscopy every 5 years or a colonoscopy every 10 years starting at age 26.  Hepatitis C blood test.  Hepatitis B blood test.  Sexually transmitted disease (STD) testing.  Diabetes screening. This is done by checking your blood sugar (glucose) after you have not eaten for a while (fasting).  You may have this done every 1-3 years.  Bone density scan. This is done to screen for osteoporosis. You may have this done starting at age 8.  Mammogram. This may be done every 1-2 years. Talk to your health care provider about how often you should have regular mammograms.  Talk with your health care provider about your test results, treatment options, and if necessary, the need for more tests. Vaccines Your health care provider may recommend certain vaccines, such as:  Influenza vaccine. This is recommended every year.  Tetanus, diphtheria, and acellular pertussis (Tdap, Td) vaccine. You may need a Td booster every 10 years.  Varicella vaccine. You may need this if you have not been vaccinated.  Zoster vaccine. You may need this after age 15.  Measles, mumps, and rubella (MMR) vaccine. You may need at least one dose of MMR if you were born in 1957 or later. You may also need a second dose.  Pneumococcal 13-valent conjugate (PCV13) vaccine. One dose is recommended after age 37.  Pneumococcal polysaccharide (PPSV23) vaccine. One dose is recommended after age 15.  Meningococcal vaccine. You may need this if you  have certain conditions.  Hepatitis A vaccine. You may need this if you have certain conditions or if you travel or work in places where you may be exposed to hepatitis A.  Hepatitis B vaccine. You may need this if you have certain conditions or if you travel or work in places where you may be exposed to hepatitis B.  Haemophilus influenzae type b (Hib) vaccine. You may need this if you have certain conditions.  Talk to your health care provider about which screenings and vaccines you need and how often you need them. This information is not intended to replace advice given to you by your health care provider. Make sure you discuss any questions you have with your health care provider. Document Released: 06/19/2015 Document Revised: 02/10/2016 Document Reviewed: 03/24/2015 Elsevier Interactive Patient Education  2018 [place]. Leg Cramps Leg cramps occur when a muscle or muscles tighten and you have no control over this tightening (involuntary muscle contraction). Muscle cramps can develop in any muscle, but the most common place is in the calf muscles of the leg. Those cramps can occur during exercise or when you are at rest. Leg cramps are painful, and they may last for a few seconds to a few minutes. Cramps may return several times before they finally stop. Usually, leg cramps are not caused by a serious medical problem. In many cases, the cause is not known. Some common causes include:  Overexertion.  Overuse from repetitive motions, or doing the same thing over and over.  Remaining in a certain position for a long period of time.  Improper preparation, form, or technique while performing a sport or an activity.  Dehydration.  Injury.  Side effects of some medicines.  Abnormally low levels of the salts and ions in your blood (electrolytes), especially potassium and calcium. These levels could be low if you are taking water pills (diuretics) or if you are pregnant.  Follow  these instructions at home: Watch your condition for any changes. Taking the following actions may help to lessen any discomfort that you are feeling:  Stay well-hydrated. Drink enough fluid to keep your urine clear or pale yellow.  Try massaging, stretching, and relaxing the affected muscle. Do this for several minutes at a time.  For tight or tense muscles, use a warm towel, heating pad, or hot shower  water directed to the affected area.  If you are sore or have pain after a cramp, applying ice to the affected area may relieve discomfort. ? Put ice in a plastic bag. ? Place a towel between your skin and the bag. ? Leave the ice on for 20 minutes, 2-3 times per day.  Avoid strenuous exercise for several days if you have been having frequent leg cramps.  Make sure that your diet includes the essential minerals for your muscles to work normally.  Take medicines only as directed by your health care provider.  Contact a health care provider if:  Your leg cramps get more severe or more frequent, or they do not improve over time.  Your foot becomes cold, numb, or blue. This information is not intended to replace advice given to you by your health care provider. Make sure you discuss any questions you have with your health care provider. Document Released: 06/30/2004 Document Revised: 10/29/2015 Document Reviewed: 04/30/2014 Elsevier Interactive Patient Education  [name].

## 2017-09-19 ENCOUNTER — Ambulatory Visit (INDEPENDENT_AMBULATORY_CARE_PROVIDER_SITE_OTHER): Payer: Self-pay | Admitting: Orthopaedic Surgery

## 2017-10-03 ENCOUNTER — Encounter (HOSPITAL_BASED_OUTPATIENT_CLINIC_OR_DEPARTMENT_OTHER): Payer: Self-pay

## 2017-10-03 ENCOUNTER — Ambulatory Visit (HOSPITAL_BASED_OUTPATIENT_CLINIC_OR_DEPARTMENT_OTHER)
Admission: RE | Admit: 2017-10-03 | Discharge: 2017-10-03 | Disposition: A | Discharge status: Home / Self Care | Payer: Medicare Other | Source: Ambulatory Visit | Attending: Family Medicine | Admitting: Family Medicine

## 2017-10-03 DIAGNOSIS — E2839 Other primary ovarian failure: Secondary | ICD-10-CM | POA: Insufficient documentation

## 2017-10-03 DIAGNOSIS — Z13820 Encounter for screening for osteoporosis: Secondary | ICD-10-CM | POA: Insufficient documentation

## 2017-10-03 DIAGNOSIS — Z1231 Encounter for screening mammogram for malignant neoplasm of breast: Secondary | ICD-10-CM | POA: Insufficient documentation

## 2017-10-03 DIAGNOSIS — Z87898 Personal history of other specified conditions: Secondary | ICD-10-CM

## 2017-10-03 DIAGNOSIS — Z1239 Encounter for other screening for malignant neoplasm of breast: Secondary | ICD-10-CM

## 2017-10-04 ENCOUNTER — Encounter: Payer: Self-pay | Admitting: Cardiovascular Disease

## 2017-10-04 ENCOUNTER — Ambulatory Visit (INDEPENDENT_AMBULATORY_CARE_PROVIDER_SITE_OTHER): Payer: Medicare Other | Admitting: Cardiovascular Disease

## 2017-10-04 VITALS — BP 144/68 | HR 72 | Ht 62.0 in | Wt 180.0 lb

## 2017-10-04 DIAGNOSIS — Z720 Tobacco use: Secondary | ICD-10-CM

## 2017-10-04 DIAGNOSIS — E7849 Other hyperlipidemia: Secondary | ICD-10-CM | POA: Diagnosis not present

## 2017-10-04 DIAGNOSIS — I255 Ischemic cardiomyopathy: Secondary | ICD-10-CM

## 2017-10-04 DIAGNOSIS — I2510 Atherosclerotic heart disease of native coronary artery without angina pectoris: Secondary | ICD-10-CM | POA: Diagnosis not present

## 2017-10-04 NOTE — Assessment & Plan Note (Signed)
History of hyperlipidemia on statin therapy. We will recheck a lipid and liver profile 

## 2017-10-04 NOTE — Assessment & Plan Note (Signed)
History of ischemic cardiomyopathy with EF 40-45% by 2-D echo 3 years ago on appropriate medications. Currently a symptomatically.

## 2017-10-04 NOTE — Progress Notes (Signed)
10/04/2017 GLORIANA PILTZ   [birth year]  [id number]  Primary Physician Libby Maw, MD Primary Cardiologist: Lorretta Harp MD Lupe Carney, [place]  HPI:  Terri [name] is a 72 y.o.  mildly overweight married Caucasian female mother of 2 children whose husband [name] is also a patient of mine. I last saw her in the office 10/04/16. Her only cardiac risk factors include 25 pack years of tobacco abuse currently smoking one half pack per day. She has actually strong family history heart disease with both parents and multiple siblings with either died or had-related issues. She is retired Insurance claims handler. Since I saw her in the office over 2 years ago she has suffered an anterior wall myocardial infarction on 01/31/15 while at [facility]. She was taken [facility] where she was found to have an occluded proximal LAD, occluded marginal branch and high-grade proximal RCA stenosis. She was cathed radially by Dr. Cecilio Asper. She had to synergy drug-eluting stents placed in an overlapping fashion in the proximal LAD and her proximal RCA. Her EF at that time was 25% which is felt clinically improved to 40-45% by 2-D echo 03/03/15 with an anteroapical wall motion abnormality. She does continue to smoke 3 cigarettes . Since I saw her year ago she's remained clinically stable specifically denying chest pain or shortness of breath.     Current Meds  Medication Sig  . acetaminophen (TYLENOL) 325 MG tablet Take 650 mg by mouth every 6 (six) hours as needed for mild pain.  Marland Kitchen atorvastatin (LIPITOR) 80 MG tablet Take 1 tablet (80 mg total) by mouth daily.  . carvedilol (COREG) 3.125 MG tablet Take 1 tablet (3.125 mg total) by mouth 2 (two) times daily.  . clopidogrel (PLAVIX) 75 MG tablet Take 1 tablet (75 mg total) by mouth daily.  . [employer] ASPIRIN 81 MG EC tablet Take 81 mg by mouth daily.  Marland Kitchen lisinopril (PRINIVIL,ZESTRIL) 2.5 MG tablet Take 1 tablet (2.5 mg total) by  mouth daily.     Allergies  Allergen Reactions  . Erythromycin     nauseated    Social History   Socioeconomic History  . Marital status: Married    Spouse name: Not on file  . Number of children: Not on file  . Years of education: Not on file  . Highest education level: Not on file  Occupational History  . Not on file  Social Needs  . Financial resource strain: Not on file  . Food insecurity:    Worry: Not on file    Inability: Not on file  . Transportation needs:    Medical: Not on file    Non-medical: Not on file  Tobacco Use  . Smoking status: Current Every Day Smoker    Packs/day: 0.50    Years: 50.00    Pack years: 25.00    Types: Cigarettes  . Smokeless tobacco: Never Used  Substance and Sexual Activity  . Alcohol use: No  . Drug use: No  . Sexual activity: Yes    Birth control/protection: Surgical  Lifestyle  . Physical activity:    Days per week: Not on file    Minutes per session: Not on file  . Stress: Not on file  Relationships  . Social connections:    Talks on phone: Not on file    Gets together: Not on file    Attends religious service: Not on file    Active member of  club or organization: Not on file    Attends meetings of clubs or organizations: Not on file    Relationship status: Not on file  . Intimate partner violence:    Fear of current or ex partner: Not on file    Emotionally abused: Not on file    Physically abused: Not on file    Forced sexual activity: Not on file  Other Topics Concern  . Not on file  Social History Narrative  . Not on file     Review of Systems: General: negative for chills, fever, night sweats or weight changes.  Cardiovascular: negative for chest pain, dyspnea on exertion, edema, orthopnea, palpitations, paroxysmal nocturnal dyspnea or shortness of breath Dermatological: negative for rash Respiratory: negative for cough or wheezing Urologic: negative for hematuria Abdominal: negative for nausea,  vomiting, diarrhea, bright red blood per rectum, melena, or hematemesis Neurologic: negative for visual changes, syncope, or dizziness All other systems reviewed and are otherwise negative except as noted above.    Blood pressure (!) 144/68, pulse 72, height 5\' 2"  (1.575 m), weight 180 lb (81.6 kg).  General appearance: alert and no distress Neck: no adenopathy, no carotid bruit, no JVD, supple, symmetrical, trachea midline and thyroid not enlarged, symmetric, no tenderness/mass/nodules Lungs: clear to auscultation bilaterally Heart: regular rate and rhythm, S1, S2 normal, no murmur, click, rub or gallop Extremities: extremities normal, atraumatic, no cyanosis or edema Pulses: 2+ and symmetric Skin: Skin color, texture, turgor normal. No rashes or lesions Neurologic: Alert and oriented X 3, normal strength and tone. Normal symmetric reflexes. Normal coordination and gait  EKG sinus rhythm at 72 with septal Q waves and left axis deviation with borderline voltage for LVH. I personally reviewed this EKG.  ASSESSMENT AND PLAN:   Hyperlipidemia History of hyperlipidemia on statin therapy. We will recheck a lipid and liver profile.  Coronary artery disease involving native heart History of CAD status post anterior wall myocardial infarction in [place] at [facility] 01/31/15. She underwent cardiac catheterization radially by Dr. Had stayed with the places individually drug-eluting stent in overlapping fashion. Proximal LAD as well as RCA. She also had an occluded obtuse marginal branch. Her EF initially was 25% which improved by 2-D echo to 40-45% on 03/03/15. She denies chest pain or shortness of breath.  Tobacco abuse Continued tobacco abuse of one half pack per day recalcitrant to risk factor modification.  Ischemic cardiomyopathy History of ischemic cardiomyopathy with EF 40-45% by 2-D echo 3 years ago on appropriate medications. Currently a  symptomatically.      Lorretta Harp MD FACP,FACC,FAHA, [facility] 10/04/2017 9:40 AM

## 2017-10-04 NOTE — Assessment & Plan Note (Signed)
Continued tobacco abuse of one half pack per day recalcitrant to risk factor modification 

## 2017-10-04 NOTE — Patient Instructions (Signed)

## 2017-10-04 NOTE — Assessment & Plan Note (Signed)
History of CAD status post anterior wall myocardial infarction in [place] at [facility] 01/31/15. She underwent cardiac catheterization radially by Dr. Had stayed with the places individually drug-eluting stent in overlapping fashion. Proximal LAD as well as RCA. She also had an occluded obtuse marginal branch. Her EF initially was 25% which improved by 2-D echo to 40-45% on 03/03/15. She denies chest pain or shortness of breath.

## 2017-10-11 ENCOUNTER — Encounter: Payer: Self-pay | Admitting: Family Medicine

## 2017-10-11 ENCOUNTER — Ambulatory Visit (INDEPENDENT_AMBULATORY_CARE_PROVIDER_SITE_OTHER): Payer: Medicare Other

## 2017-10-11 ENCOUNTER — Ambulatory Visit (INDEPENDENT_AMBULATORY_CARE_PROVIDER_SITE_OTHER): Payer: Medicare Other | Admitting: Family Medicine

## 2017-10-11 VITALS — BP 136/70 | HR 78 | Ht 62.0 in | Wt 180.2 lb

## 2017-10-11 DIAGNOSIS — I10 Essential (primary) hypertension: Secondary | ICD-10-CM | POA: Diagnosis not present

## 2017-10-11 DIAGNOSIS — J301 Allergic rhinitis due to pollen: Secondary | ICD-10-CM | POA: Diagnosis not present

## 2017-10-11 DIAGNOSIS — J439 Emphysema, unspecified: Secondary | ICD-10-CM

## 2017-10-11 DIAGNOSIS — J440 Chronic obstructive pulmonary disease with acute lower respiratory infection: Secondary | ICD-10-CM

## 2017-10-11 DIAGNOSIS — J449 Chronic obstructive pulmonary disease, unspecified: Secondary | ICD-10-CM | POA: Diagnosis not present

## 2017-10-11 DIAGNOSIS — J209 Acute bronchitis, unspecified: Secondary | ICD-10-CM | POA: Diagnosis not present

## 2017-10-11 DIAGNOSIS — H6991 Unspecified Eustachian tube disorder, right ear: Secondary | ICD-10-CM | POA: Insufficient documentation

## 2017-10-11 DIAGNOSIS — R059 Cough, unspecified: Secondary | ICD-10-CM | POA: Insufficient documentation

## 2017-10-11 DIAGNOSIS — Z1211 Encounter for screening for malignant neoplasm of colon: Secondary | ICD-10-CM

## 2017-10-11 DIAGNOSIS — R05 Cough: Secondary | ICD-10-CM | POA: Diagnosis not present

## 2017-10-11 DIAGNOSIS — Z8701 Personal history of pneumonia (recurrent): Secondary | ICD-10-CM

## 2017-10-11 DIAGNOSIS — H6981 Other specified disorders of Eustachian tube, right ear: Secondary | ICD-10-CM

## 2017-10-11 LAB — URINALYSIS, ROUTINE W REFLEX MICROSCOPIC
BILIRUBIN URINE: NEGATIVE
Hgb urine dipstick: NEGATIVE
KETONES UR: NEGATIVE
Leukocytes, UA: NEGATIVE
Nitrite: NEGATIVE
PH: 6 (ref 5.0–8.0)
RBC / HPF: NONE SEEN (ref 0–?)
TOTAL PROTEIN, URINE-UPE24: NEGATIVE
URINE GLUCOSE: NEGATIVE
UROBILINOGEN UA: 0.2 (ref 0.0–1.0)
WBC UA: NONE SEEN (ref 0–?)

## 2017-10-11 MED ORDER — PREDNISONE 20 MG PO TABS: 20.0000 mg | ORAL_TABLET | Freq: Two times a day (BID) | ORAL | 0 refills | Status: AC

## 2017-10-11 MED ORDER — CETIRIZINE HCL 10 MG PO TABS: 10.0000 mg | ORAL_TABLET | Freq: Every day | ORAL | 1 refills | Status: DC

## 2017-10-11 MED ORDER — HYDROCODONE-HOMATROPINE 5-1.5 MG/5ML PO SYRP: 5.0000 mL | ORAL_SOLUTION | Freq: Every day | ORAL | 0 refills | Status: DC

## 2017-10-11 NOTE — Progress Notes (Addendum)
Subjective:  Patient ID: Terri [name], female    DOB: 09/07/1945  Age: 72 y.o. MRN: [id number]  CC: Follow-up   HPI Terri [name] presents for follow-up of her pneumonia with a repeat chest x-ray.  She continues to cough and sometimes at night.  There is been a little bit of wheezing.  She denies chest tightness or of breath.  Cough is mostly nonproductive.  There is been no fever or purulence.  She continues to have postnasal drip with sneezing and a scratchy throat.  Right ear is stopped up.  She has not had a colonoscopy in years.  She does have a history of polyps.  There were no polyps with her last colonoscopy some years ago.    Outpatient Medications Prior to Visit  Medication Sig Dispense Refill  . acetaminophen (TYLENOL) 325 MG tablet Take 650 mg by mouth every 6 (six) hours as needed for mild pain.    Marland Kitchen atorvastatin (LIPITOR) 80 MG tablet Take 1 tablet (80 mg total) by mouth daily. 90 tablet 3  . carvedilol (COREG) 3.125 MG tablet Take 1 tablet (3.125 mg total) by mouth 2 (two) times daily. 180 tablet 3  . clopidogrel (PLAVIX) 75 MG tablet Take 1 tablet (75 mg total) by mouth daily. 90 tablet 3  . CVS ASPIRIN 81 MG EC tablet Take 81 mg by mouth daily.  0  . lisinopril (PRINIVIL,ZESTRIL) 2.5 MG tablet Take 1 tablet (2.5 mg total) by mouth daily. 90 tablet 3   No facility-administered medications prior to visit.     ROS Review of Systems  Constitutional: Negative for chills, fatigue and unexpected weight change.  HENT: Positive for congestion, hearing loss, postnasal drip, rhinorrhea and sneezing. Negative for sinus pressure, sinus pain and sore throat.   Eyes: Negative.   Respiratory: Positive for cough and wheezing. Negative for chest tightness and shortness of breath.   Cardiovascular: Negative.   Gastrointestinal: Negative.   Genitourinary: Negative.   Musculoskeletal: Negative for gait problem and joint swelling.  Skin: Negative.   Allergic/Immunologic: Negative for  immunocompromised state.  Neurological: Negative for weakness and headaches.  Hematological: Does not bruise/bleed easily.  Psychiatric/Behavioral: Negative.     Objective:  BP 136/70   Pulse 78   Ht 5\' 2"  (1.575 m)   Wt 180 lb 4 oz (81.8 kg)   SpO2 96%   BMI 32.97 kg/m   BP Readings from Last 3 Encounters:  10/11/17 136/70  10/04/17 (!) 144/68  09/11/17 128/80    Wt Readings from Last 3 Encounters:  10/11/17 180 lb 4 oz (81.8 kg)  10/04/17 180 lb (81.6 kg)  09/11/17 179 lb 8 oz (81.4 kg)    Physical Exam  Constitutional: She is oriented to person, place, and time. She appears well-developed and well-nourished. No distress.  HENT:  Head: Normocephalic.  Right Ear: External ear normal. Tympanic membrane is retracted. Tympanic membrane is not injected, not perforated and not erythematous. No middle ear effusion. No hemotympanum.  Left Ear: External ear normal.  Mouth/Throat: Oropharynx is clear and moist. No oropharyngeal exudate.  Eyes: Pupils are equal, round, and reactive to light. Conjunctivae and EOM are normal. Right eye exhibits no discharge. Left eye exhibits no discharge. No scleral icterus.  Neck: Normal range of motion. Neck supple. No JVD present. No thyromegaly present.  Cardiovascular: Normal rate, regular rhythm and normal heart sounds.  Pulmonary/Chest: Effort normal and breath sounds normal. No stridor. No respiratory distress. She has no wheezes. She has  no rales. She exhibits no tenderness.  Abdominal: Bowel sounds are normal.  Musculoskeletal: She exhibits no edema.  Lymphadenopathy:    She has no cervical adenopathy.  Neurological: She is alert and oriented to person, place, and time.  Skin: Skin is warm and dry. She is not diaphoretic.  Psychiatric: She has a normal mood and affect. Her behavior is normal.    Lab Results  Component Value Date   WBC 10.5 08/25/2017   HGB 13.1 08/25/2017   HCT 39.8 08/25/2017   PLT 230 08/25/2017   GLUCOSE 87  08/25/2017   CHOL 120 09/14/2016   TRIG 272 (H) 09/14/2016   HDL 36 (L) 09/14/2016   LDLCALC 30 09/14/2016   ALT 17 09/14/2016   AST 10 09/14/2016   NA 137 08/25/2017   K 4.1 08/25/2017   CL 105 08/25/2017   CREATININE 0.81 08/25/2017   BUN 11 08/25/2017   CO2 23 08/25/2017   TSH 2.508 01/14/2013   INR 0.93 07/15/2014   HGBA1C 6.0 02/27/2015    Dg Bone Density  Result Date: 10/03/2017 EXAM: DUAL X-RAY ABSORPTIOMETRY (DXA) FOR BONE MINERAL DENSITY IMPRESSION: Dear  Mortimer Fries [facility] Your patient Diera Wirkkala completed a BMD test on 10/03/2017 using the Skyline (analysis version: 16.SP2) manufactured by [employer]. The following summarizes the results of our evaluation. PATIENT: Name: Anieya, Helman Patient ID: [id number] Birth Date: [birth year] Height: 62.0 in. Gender: Female Measured: 10/03/2017 Weight: 178.4 lbs. Indications: Advanced Age, Caucasian, Estrogen Deficiency, Hysterectomy, Low Calcium Intake, Post Menopausal, Tobacco User(Current) Fractures: Treatments: ASSESSMENT: The BMD measured at Femur Neck Left is 0.999 g/cm2 with a T-score of -0.3. This patient is considered normal according to [facility]) criteria. Right hip was not scanned due to hip replacement. L-2 & 3 was excluded due to (egenerative changes. Site Region Measured Date Measured Age WHO YA BMD Classification T-score AP Spine L1-L4 (L2,L3) 10/03/2017 70.9 Normal 3.4 1.571 g/cm2 DualFemur Total Mean 10/03/2017 70.9 years Normal 0.9 1.115 g/cm2 World Health Organization [facility]) criteria for post-menopausal, Caucasian Women: Normal       T-score at or above -1 SD Osteopenia   T-score between -1 and -2.5 SD Osteoporosis T-score at or below -2.5 SD RECOMMENDATION: 1.All patients should optimize calcium and vitamin D intake. 2. Consider FDA-approved medical therapies in postmenopausal women women and men aged 80 years and older, based on the following: a. A hip or vertebral(clinical or morphometric)  fracture. b. T-Score < -2.5 at the femoral neck or spine after appropriate evaluation to exclude secondary causes c. Low bone mass(T-score between -1.0 and -2.5 at the femoral neck or spine) and a 10 year probability of a hip fracture >3% or a 10 year probability of major osteoporosis-related fracture > 20% based on the [place]-adapted WHO algorithm d. Clinical judhement and/or patient preferences may indicate treatment for people with 10-year fracture probabilities above or below these levels FOLLOW-UP: Patients with diagnosis of osteoporosis or at high risk for fracture should have regular bone mineral density tests. For patients eligible for Medicare, routine testing is allowed once every 2 years. The testing frequency can be increased to one year for patients who have rapidly progressing disease, those who are receiving or discontinuing medical therapy to restore bone mass, or have additional risk factors. I have reviewed this report and agree with the above findings. [facility] Radiology Electronically Signed   By: Lowella Grip III M.D.   On: 10/03/2017 10:39   Mm 3d Screen Breast  Bilateral  Result Date: 10/03/2017 CLINICAL DATA:  Screening. EXAM: DIGITAL SCREENING BILATERAL MAMMOGRAM WITH TOMO AND CAD COMPARISON:  None. ACR Breast Density Category b: There are scattered areas of fibroglandular density. FINDINGS: There are no findings suspicious for malignancy. Images were processed with CAD. IMPRESSION: No mammographic evidence of malignancy. A result letter of this screening mammogram will be mailed directly to the patient. RECOMMENDATION: Screening mammogram in one year. (Code:SM-B-01Y) BI-RADS CATEGORY  1: Negative. Electronically Signed   By: Abelardo Diesel M.D.   On: 10/03/2017 16:02    Assessment & Plan:   Nate was seen today for follow-up.  Diagnoses and all orders for this visit:  History of pneumonia -     DG Chest 2 View; Future -     DG Chest 2 View  Seasonal allergic rhinitis due  to pollen -     cetirizine (ZYRTEC) 10 MG tablet; Take 1 tablet (10 mg total) by mouth daily.  Acute bronchitis with COPD ([place]) -     benzonatate (TESSALON) 100 MG capsule; Take 1 capsule (100 mg total) by mouth 2 (two) times daily as needed for cough.  Screen for colon cancer -     Ambulatory referral to Gastroenterology  Cough -     predniSONE (DELTASONE) 20 MG tablet; Take 1 tablet (20 mg total) by mouth 2 (two) times daily with a meal for 7 days. -     Discontinue: HYDROcodone-homatropine (HYCODAN) 5-1.5 MG/5ML syrup; Take 5 mLs by mouth at bedtime. As needed. -     benzonatate (TESSALON) 100 MG capsule; Take 1 capsule (100 mg total) by mouth 2 (two) times daily as needed for cough.  Pulmonary emphysema, unspecified emphysema type ([place]) -     predniSONE (DELTASONE) 20 MG tablet; Take 1 tablet (20 mg total) by mouth 2 (two) times daily with a meal for 7 days. -     DG Chest 2 View; Future -     DG Chest 2 View  Essential hypertension -     Urinalysis, Routine w reflex microscopic  Dysfunction of right eustachian tube -     predniSONE (DELTASONE) 20 MG tablet; Take 1 tablet (20 mg total) by mouth 2 (two) times daily with a meal for 7 days. -     cetirizine (ZYRTEC) 10 MG tablet; Take 1 tablet (10 mg total) by mouth daily.   I have discontinued Angelice D. Galer's HYDROcodone-homatropine. I am also having her start on predniSONE, cetirizine, and benzonatate. Additionally, I am having her maintain her CVS ASPIRIN, acetaminophen, lisinopril, clopidogrel, carvedilol, and atorvastatin.  Meds ordered this encounter  Medications  . predniSONE (DELTASONE) 20 MG tablet    Sig: Take 1 tablet (20 mg total) by mouth 2 (two) times daily with a meal for 7 days.    Dispense:  14 tablet    Refill:  0  . cetirizine (ZYRTEC) 10 MG tablet    Sig: Take 1 tablet (10 mg total) by mouth daily.    Dispense:  30 tablet    Refill:  1  . DISCONTD: HYDROcodone-homatropine (HYCODAN) 5-1.5 MG/5ML syrup     Sig: Take 5 mLs by mouth at bedtime. As needed.    Dispense:  60 mL    Refill:  0  . benzonatate (TESSALON) 100 MG capsule    Sig: Take 1 capsule (100 mg total) by mouth 2 (two) times daily as needed for cough.    Dispense:  20 capsule    Refill:  0  hydromet is on back order.  Asked patient to stop smoking.  Follow-up: Return in about 3 months (around 01/11/2018).  Libby Maw, MD

## 2017-10-12 ENCOUNTER — Telehealth: Payer: Self-pay

## 2017-10-12 MED ORDER — BENZONATATE 100 MG PO CAPS: 100.0000 mg | ORAL_CAPSULE | Freq: Two times a day (BID) | ORAL | 0 refills | Status: DC | PRN

## 2017-10-12 NOTE — Addendum Note (Signed)
Addended by: Jon Billings on: 10/12/2017 03:41 PM   Modules accepted: Orders

## 2017-10-12 NOTE — Telephone Encounter (Signed)
Received a fax from [place] that the Hycodan is on back order with no current release date. Can this be changed to something different?

## 2017-10-13 NOTE — Telephone Encounter (Signed)
Rx replaced by Dr. Ethelene Hal.

## 2017-10-26 ENCOUNTER — Encounter: Payer: Self-pay | Admitting: Family Medicine

## 2017-10-26 ENCOUNTER — Ambulatory Visit (INDEPENDENT_AMBULATORY_CARE_PROVIDER_SITE_OTHER): Payer: Medicare Other | Admitting: Family Medicine

## 2017-10-26 VITALS — BP 130/80 | HR 93 | Temp 98.0°F | Ht 62.0 in | Wt 180.0 lb

## 2017-10-26 DIAGNOSIS — J0100 Acute maxillary sinusitis, unspecified: Secondary | ICD-10-CM | POA: Diagnosis not present

## 2017-10-26 DIAGNOSIS — J439 Emphysema, unspecified: Secondary | ICD-10-CM | POA: Diagnosis not present

## 2017-10-26 DIAGNOSIS — R05 Cough: Secondary | ICD-10-CM

## 2017-10-26 DIAGNOSIS — R059 Cough, unspecified: Secondary | ICD-10-CM

## 2017-10-26 DIAGNOSIS — H6981 Other specified disorders of Eustachian tube, right ear: Secondary | ICD-10-CM | POA: Diagnosis not present

## 2017-10-26 MED ORDER — AMOXICILLIN-POT CLAVULANATE 875-125 MG PO TABS: 1.0000 | ORAL_TABLET | Freq: Two times a day (BID) | ORAL | 0 refills | Status: DC

## 2017-10-26 MED ORDER — BENZONATATE 200 MG PO CAPS: 200.0000 mg | ORAL_CAPSULE | Freq: Two times a day (BID) | ORAL | 0 refills | Status: DC | PRN

## 2017-10-26 MED ORDER — ALBUTEROL SULFATE HFA 108 (90 BASE) MCG/ACT IN AERS: 1.0000 | INHALATION_SPRAY | Freq: Four times a day (QID) | RESPIRATORY_TRACT | 0 refills | Status: DC | PRN

## 2017-10-26 MED ORDER — METHYLPREDNISOLONE SODIUM SUCC 125 MG IJ SOLR
125.0000 mg | Freq: Once | INTRAMUSCULAR | Status: AC
  Administered 2017-10-26: 125 mg via INTRAMUSCULAR

## 2017-10-26 MED ORDER — PREDNISONE 10 MG (21) PO TBPK: ORAL_TABLET | ORAL | 0 refills | Status: DC

## 2017-10-26 NOTE — Progress Notes (Signed)
Subjective:  Patient ID: Terri [name], female    DOB: 1945/09/29  Age: 72 y.o. MRN: [id number]  CC: Cough and Nasal Congestion   HPI Terri [name] presents for follow-up of her sinus congestion, cough and severe right ear congestion.  Her cough persists and it is productive of yellow phlegm.  There is less wheezing in her chest.  Chest x-ray taken on the eighth of this month was clear.  She is now experiencing ongoing facial pressure that is great over the right cheekbone.  Right ear congestion with decreased hearing persists.  She has had no fevers or chills.  There is been no nausea or vomiting.  She is tried Mucinex D with minimal relief.  She never received the Tessalon because it was on back order at the time.  She did take her prednisone that did provide some relief.  She continues to smoke.  She is accompanied by her husband today.  Outpatient Medications Prior to Visit  Medication Sig Dispense Refill  . acetaminophen (TYLENOL) 325 MG tablet Take 650 mg by mouth every 6 (six) hours as needed for mild pain.    Marland Kitchen atorvastatin (LIPITOR) 80 MG tablet Take 1 tablet (80 mg total) by mouth daily. 90 tablet 3  . carvedilol (COREG) 3.125 MG tablet Take 1 tablet (3.125 mg total) by mouth 2 (two) times daily. 180 tablet 3  . cetirizine (ZYRTEC) 10 MG tablet Take 1 tablet (10 mg total) by mouth daily. 30 tablet 1  . clopidogrel (PLAVIX) 75 MG tablet Take 1 tablet (75 mg total) by mouth daily. 90 tablet 3  . [facility] ASPIRIN 81 MG EC tablet Take 81 mg by mouth daily.  0  . lisinopril (PRINIVIL,ZESTRIL) 2.5 MG tablet Take 1 tablet (2.5 mg total) by mouth daily. 90 tablet 3  . benzonatate (TESSALON) 100 MG capsule Take 1 capsule (100 mg total) by mouth 2 (two) times daily as needed for cough. 20 capsule 0   No facility-administered medications prior to visit.     ROS Review of Systems  Constitutional: Positive for fatigue. Negative for chills, fever and unexpected weight change.  HENT: Positive for  congestion, ear pain, hearing loss, postnasal drip, rhinorrhea, sinus pressure and sinus pain. Negative for trouble swallowing.   Eyes: Negative for photophobia and visual disturbance.  Respiratory: Positive for cough. Negative for chest tightness and wheezing.   Cardiovascular: Negative.   Gastrointestinal: Negative.   Endocrine: Negative for polyphagia and polyuria.  Genitourinary: Negative.   Musculoskeletal: Negative for arthralgias and myalgias.  Skin: Negative for pallor and rash.  Allergic/Immunologic: Negative for immunocompromised state.  Neurological: Positive for headaches. Negative for weakness and light-headedness.  Hematological: Does not bruise/bleed easily.  Psychiatric/Behavioral: Negative.     Objective:  BP 130/80   Pulse 93   Temp 98 F (36.7 C)   Ht 5\' 2"  (1.575 m)   Wt 180 lb (81.6 kg)   SpO2 96%   BMI 32.92 kg/m   BP Readings from Last 3 Encounters:  10/26/17 130/80  10/11/17 136/70  10/04/17 (!) 144/68    Wt Readings from Last 3 Encounters:  10/26/17 180 lb (81.6 kg)  10/11/17 180 lb 4 oz (81.8 kg)  10/04/17 180 lb (81.6 kg)    Physical Exam  Constitutional: She is oriented to person, place, and time. She appears well-developed and well-nourished. No distress.  HENT:  Head: Normocephalic and atraumatic.  Right Ear: External ear normal. Tympanic membrane is injected and retracted. No middle ear  effusion. No hemotympanum.  Left Ear: Hearing, tympanic membrane, external ear and ear canal normal.  Nose: Nose normal.  Mouth/Throat: Oropharynx is clear and moist. No oropharyngeal exudate.  Eyes: Pupils are equal, round, and reactive to light. Conjunctivae and EOM are normal. Right eye exhibits no discharge. Left eye exhibits no discharge. No scleral icterus.  Neck: Neck supple. No JVD present. No tracheal deviation present. No thyromegaly present.  Cardiovascular: Normal rate, regular rhythm and normal heart sounds.  Pulmonary/Chest: Effort normal  and breath sounds normal. No stridor. No respiratory distress. She has no wheezes. She has no rales.  Abdominal: Bowel sounds are normal.  Musculoskeletal: She exhibits no edema.  Lymphadenopathy:    She has no cervical adenopathy.  Neurological: She is alert and oriented to person, place, and time.  Skin: Skin is warm and dry. She is not diaphoretic.  Psychiatric: She has a normal mood and affect. Her behavior is normal.    Lab Results  Component Value Date   WBC 10.5 08/25/2017   HGB 13.1 08/25/2017   HCT 39.8 08/25/2017   PLT 230 08/25/2017   GLUCOSE 87 08/25/2017   CHOL 120 09/14/2016   TRIG 272 (H) 09/14/2016   HDL 36 (L) 09/14/2016   LDLCALC 30 09/14/2016   ALT 17 09/14/2016   AST 10 09/14/2016   NA 137 08/25/2017   K 4.1 08/25/2017   CL 105 08/25/2017   CREATININE 0.81 08/25/2017   BUN 11 08/25/2017   CO2 23 08/25/2017   TSH 2.508 01/14/2013   INR 0.93 07/15/2014   HGBA1C 6.0 02/27/2015    Dg Bone Density  Result Date: 10/03/2017 EXAM: DUAL X-RAY ABSORPTIOMETRY (DXA) FOR BONE MINERAL DENSITY IMPRESSION: Dear  [facility]ri [name] Your patient Terri [name] completed a BMD test on 10/03/2017 using the [place] (analysis version: 16.SP2) manufactured by [employer]. The following summarizes the results of our evaluation. PATIENT: Name: Terri, [name] Patient ID: [id number] Birth Date: Oct 09, 1945 Height: 62.0 in. Gender: Female Measured: 10/03/2017 Weight: 178.4 lbs. Indications: Advanced Age, Caucasian, Estrogen Deficiency, Hysterectomy, Low Calcium Intake, Post Menopausal, Tobacco User(Current) Fractures: Treatments: ASSESSMENT: The BMD measured at Femur Neck Left is 0.999 g/cm2 with a T-score of -0.3. This patient is considered normal according to [facility]) criteria. Right hip was not scanned due to hip replacement. L-2 & 3 was excluded due to (egenerative changes. Site Region Measured Date Measured Age WHO YA BMD Classification T-score AP  Spine L1-L4 (L2,L3) 10/03/2017 70.9 Normal 3.4 1.571 g/cm2 DualFemur Total Mean 10/03/2017 70.9 years Normal 0.9 1.115 g/cm2 World Health Organization [facility]) criteria for post-menopausal, Caucasian Women: Normal       T-score at or above -1 SD Osteopenia   T-score between -1 and -2.5 SD Osteoporosis T-score at or below -2.5 SD RECOMMENDATION: 1.All patients should optimize calcium and vitamin D intake. 2. Consider FDA-approved medical therapies in postmenopausal women women and men aged 69 years and older, based on the following: a. A hip or vertebral(clinical or morphometric) fracture. b. T-Score < -2.5 at the femoral neck or spine after appropriate evaluation to exclude secondary causes c. Low bone mass(T-score between -1.0 and -2.5 at the femoral neck or spine) and a 10 year probability of a hip fracture >3% or a 10 year probability of major osteoporosis-related fracture > 20% based on the [place]-adapted WHO algorithm d. Clinical judhement and/or patient preferences may indicate treatment for people with 10-year fracture probabilities above or below these levels FOLLOW-UP: Patients with  diagnosis of osteoporosis or at high risk for fracture should have regular bone mineral density tests. For patients eligible for Medicare, routine testing is allowed once every 2 years. The testing frequency can be increased to one year for patients who have rapidly progressing disease, those who are receiving or discontinuing medical therapy to restore bone mass, or have additional risk factors. I have reviewed this report and agree with the above findings. [facility] Electronically Signed   By: Lowella Grip III M.D.   On: 10/03/2017 10:39   Mm 3d Screen Breast Bilateral  Result Date: 10/03/2017 CLINICAL DATA:  Screening. EXAM: DIGITAL SCREENING BILATERAL MAMMOGRAM WITH TOMO AND CAD COMPARISON:  None. ACR Breast Density Category b: There are scattered areas of fibroglandular density. FINDINGS: There are no findings  suspicious for malignancy. Images were processed with CAD. IMPRESSION: No mammographic evidence of malignancy. A result letter of this screening mammogram will be mailed directly to the patient. RECOMMENDATION: Screening mammogram in one year. (Code:SM-B-01Y) BI-RADS CATEGORY  1: Negative. Electronically Signed   By: Abelardo Diesel M.D.   On: 10/03/2017 16:02    Assessment & Plan:   Evie was seen today for cough and nasal congestion.  Diagnoses and all orders for this visit:  Dysfunction of right eustachian tube -     predniSONE (STERAPRED UNI-PAK 21 TAB) 10 MG (21) TBPK tablet; Take 6 today, 5 tomorrow and then 4, 3, 2, 1 and stop -     methylPREDNISolone sodium succinate (SOLU-MEDROL) 125 mg/2 mL injection 125 mg  Pulmonary emphysema, unspecified emphysema type (HCC) -     predniSONE (STERAPRED UNI-PAK 21 TAB) 10 MG (21) TBPK tablet; Take 6 today, 5 tomorrow and then 4, 3, 2, 1 and stop -     benzonatate (TESSALON) 200 MG capsule; Take 1 capsule (200 mg total) by mouth 2 (two) times daily as needed for cough. -     albuterol (PROVENTIL HFA;VENTOLIN HFA) 108 (90 Base) MCG/ACT inhaler; Inhale 1-2 puffs into the lungs every 6 (six) hours as needed for wheezing or shortness of breath. And or cough.  Cough -     predniSONE (STERAPRED UNI-PAK 21 TAB) 10 MG (21) TBPK tablet; Take 6 today, 5 tomorrow and then 4, 3, 2, 1 and stop -     benzonatate (TESSALON) 200 MG capsule; Take 1 capsule (200 mg total) by mouth 2 (two) times daily as needed for cough. -     albuterol (PROVENTIL HFA;VENTOLIN HFA) 108 (90 Base) MCG/ACT inhaler; Inhale 1-2 puffs into the lungs every 6 (six) hours as needed for wheezing or shortness of breath. And or cough. -     methylPREDNISolone sodium succinate (SOLU-MEDROL) 125 mg/2 mL injection 125 mg  Acute maxillary sinusitis, recurrence not specified -     amoxicillin-clavulanate (AUGMENTIN) 875-125 MG tablet; Take 1 tablet by mouth 2 (two) times daily.   I have  discontinued Jonna D. Labus's benzonatate. I am also having her start on amoxicillin-clavulanate, predniSONE, benzonatate, and albuterol. Additionally, I am having her maintain her [facility] ASPIRIN, acetaminophen, lisinopril, clopidogrel, carvedilol, atorvastatin, and cetirizine. We will continue to administer methylPREDNISolone sodium succinate.  Meds ordered this encounter  Medications  . amoxicillin-clavulanate (AUGMENTIN) 875-125 MG tablet    Sig: Take 1 tablet by mouth 2 (two) times daily.    Dispense:  20 tablet    Refill:  0  . predniSONE (STERAPRED UNI-PAK 21 TAB) 10 MG (21) TBPK tablet    Sig: Take 6 today, 5 tomorrow and then  4, 3, 2, 1 and stop    Dispense:  21 tablet    Refill:  0  . benzonatate (TESSALON) 200 MG capsule    Sig: Take 1 capsule (200 mg total) by mouth 2 (two) times daily as needed for cough.    Dispense:  20 capsule    Refill:  0  . albuterol (PROVENTIL HFA;VENTOLIN HFA) 108 (90 Base) MCG/ACT inhaler    Sig: Inhale 1-2 puffs into the lungs every 6 (six) hours as needed for wheezing or shortness of breath. And or cough.    Dispense:  1 Inhaler    Refill:  0  . methylPREDNISolone sodium succinate (SOLU-MEDROL) 125 mg/2 mL injection 125 mg   Demonstrated eustachian exercises for the patient.  Am hopeful that today's Solu-Medrol injection will help open up her eustachian tubes for her.  She will then start exercises as demonstrated for that issue.  I told her that I was desperate for her to stop smoking.  We discussed that it is never too late for 1 to stop smoking.  She will return to discuss strategies for quitting smoking.  Follow-up: Return in about 1 week (around 11/02/2017), or if symptoms worsen or fail to improve.  Libby Maw, MD

## 2017-10-26 NOTE — Patient Instructions (Signed)
How to Use a Metered Dose Inhaler A metered dose inhaler is a handheld device for taking medicine that must be breathed into the lungs (inhaled). The device can be used to deliver a variety of inhaled medicines, including:  Quick relief or rescue medicines, such as bronchodilators.  Controller medicines, such as corticosteroids.  The medicine is delivered by pushing down on a metal canister to release a preset amount of spray and medicine. Each device contains the amount of medicine that is needed for a preset number of uses (inhalations). Your health care provider may recommend that you use a spacer with your inhaler to help you take the medicine more effectively. A spacer is a plastic tube with a mouthpiece on one end and an opening that connects to the inhaler on the other end. A spacer holds the medicine in a tube for a short time, which allows you to inhale more medicine. What are the risks? If you do not use your inhaler correctly, medicine might not reach your lungs to help you breathe. Inhaler medicine can cause side effects, such as:  Mouth or throat infection.  Cough.  Hoarseness.  Headache.  Nausea and vomiting.  Lung infection (pneumonia) in people who have a lung condition called COPD.  How to use a metered dose inhaler without a spacer 1. Remove the cap from the inhaler. 2. If you are using the inhaler for the first time, shake it for 5 seconds, turn it away from your face, then release 4 puffs into the air. This is called priming. 3. Shake the inhaler for 5 seconds. 4. Position the inhaler so the top of the canister faces up. 5. Put your index finger on the top of the medicine canister. Support the bottom of the inhaler with your thumb. 6. Breathe out normally and as completely as possible, away from the inhaler. 7. Either place the inhaler between your teeth and close your lips tightly around the mouthpiece, or hold the inhaler 1-2 inches (2.5-5 cm) away from your open  mouth. Keep your tongue down out of the way. If you are unsure which technique to use, ask your health care provider. 8. Press the canister down with your index finger to release the medicine, then inhale deeply and slowly through your mouth (not your nose) until your lungs are completely filled. Inhaling should take 4-6 seconds. 9. Hold the medicine in your lungs for 5-10 seconds (10 seconds is best). This helps the medicine get into the small airways of your lungs. 10. With your lips in a tight circle (pursed), breathe out slowly. 11. Repeat steps 3-10 until you have taken the number of puffs that your health care provider directed. Wait about 1 minute between puffs or as directed. 12. Put the cap on the inhaler. 13. If you are using a steroid inhaler, rinse your mouth with water, gargle, and spit out the water. Do not swallow the water. How to use a metered dose inhaler with a spacer 1. Remove the cap from the inhaler. 2. If you are using the inhaler for the first time, shake it for 5 seconds, turn it away from your face, then release 4 puffs into the air. This is called priming. 3. Shake the inhaler for 5 seconds. 4. Place the open end of the spacer onto the inhaler mouthpiece. 5. Position the inhaler so the top of the canister faces up and the spacer mouthpiece faces you. 6. Put your index finger on the top of the medicine canister.  Support the bottom of the inhaler and the spacer with your thumb. 7. Breathe out normally and as completely as possible, away from the spacer. 8. Place the spacer between your teeth and close your lips tightly around it. Keep your tongue down out of the way. 9. Press the canister down with your index finger to release the medicine, then inhale deeply and slowly through your mouth (not your nose) until your lungs are completely filled. Inhaling should take 4-6 seconds. 10. Hold the medicine in your lungs for 5-10 seconds (10 seconds is best). This helps the medicine  get into the small airways of your lungs. 11. With your lips in a tight circle (pursed), breathe out slowly. 12. Repeat steps 3-11 until you have taken the number of puffs that your health care provider directed. Wait about 1 minute between puffs or as directed. 13. Remove the spacer from the inhaler and put the cap on the inhaler. 14. If you are using a steroid inhaler, rinse your mouth with water, gargle, and spit out the water. Do not swallow the water. Follow these instructions at home:  Take your inhaled medicine only as told by your health care provider. Do not use the inhaler more than directed by your health care provider.  Keep all follow-up visits as told by your health care provider. This is important.  If your inhaler has a counter, you can check it to determine how full your inhaler is. If your inhaler does not have a counter, ask your health care provider when you will need to refill your inhaler and write the refill date on a calendar or on your inhaler canister. Note that you cannot know when an inhaler is empty by shaking it.  Follow directions on the package insert for care and cleaning of your inhaler and spacer. Contact a health care provider if:  Symptoms are only partially relieved with your inhaler.  You are having trouble using your inhaler.  You have an increase in phlegm.  You have headaches. Get help right away if:  You feel little or no relief after using your inhaler.  You have dizziness.  You have a fast heart rate.  You have chills or a fever.  You have night sweats.  There is blood in your phlegm. Summary  A metered dose inhaler is a handheld device for taking medicine that must be breathed into the lungs (inhaled).  The medicine is delivered by pushing down on a metal canister to release a preset amount of spray and medicine.  Each device contains the amount of medicine that is needed for a preset number of uses (inhalations). This  information is not intended to replace advice given to you by your health care provider. Make sure you discuss any questions you have with your health care provider. Document Released: 05/23/2005 Document Revised: 04/12/2016 Document Reviewed: 04/12/2016 Elsevier Interactive Patient Education  2017 [employer].  Eustachian Tube Dysfunction The eustachian tube connects the middle ear to the back of the nose. It regulates air pressure in the middle ear by allowing air to move between the ear and nose. It also helps to drain fluid from the middle ear space. When the eustachian tube does not function properly, air pressure, fluid, or both can build up in the middle ear. Eustachian tube dysfunction can affect one or both ears. What are the causes? This condition happens when the eustachian tube becomes blocked or cannot open normally. This may result from:  Ear infections.  Colds and other upper respiratory infections.  Allergies.  Irritation, such as from cigarette smoke or acid from the stomach coming up into the esophagus (gastroesophageal reflux).  Sudden changes in air pressure, such as from descending in an airplane.  Abnormal growths in the nose or throat, such as nasal polyps, tumors, or enlarged tissue at the back of the throat (adenoids).  What increases the risk? This condition may be more likely to develop in people who smoke and people who are overweight. Eustachian tube dysfunction may also be more likely to develop in children, especially children who have:  Certain birth defects of the mouth, such as cleft palate.  Large tonsils and adenoids.  What are the signs or symptoms? Symptoms of this condition may include:  A feeling of fullness in the ear.  Ear pain.  Clicking or popping noises in the ear.  Ringing in the ear.  Hearing loss.  Loss of balance.  Symptoms may get worse when the air pressure around you changes, such as when you travel to an area of high  elevation or fly on an airplane. How is this diagnosed? This condition may be diagnosed based on:  Your symptoms.  A physical exam of your ear, nose, and throat.  Tests, such as those that measure: ? The movement of your eardrum (tympanogram). ? Your hearing (audiometry).  How is this treated? Treatment depends on the cause and severity of your condition. If your symptoms are mild, you may be able to relieve your symptoms by moving air into ("popping") your ears. If you have symptoms of fluid in your ears, treatment may include:  Decongestants.  Antihistamines.  Nasal sprays or ear drops that contain medicines that reduce swelling (steroids).  In some cases, you may need to have a procedure to drain the fluid in your eardrum (myringotomy). In this procedure, a small tube is placed in the eardrum to:  Drain the fluid.  Restore the air in the middle ear space.  Follow these instructions at home:  Take over-the-counter and prescription medicines only as told by your health care provider.  Use techniques to help pop your ears as recommended by your health care provider. These may include: ? Chewing gum. ? Yawning. ? Frequent, forceful swallowing. ? Closing your mouth, holding your nose closed, and gently blowing as if you are trying to blow air out of your nose.  Do not do any of the following until your health care provider approves: ? Travel to high altitudes. ? Fly in airplanes. ? Work in a Pension scheme manager or room. ? Scuba dive.  Keep your ears dry. Dry your ears completely after showering or bathing.  Do not smoke.  Keep all follow-up visits as told by your health care provider. This is important. Contact a health care provider if:  Your symptoms do not go away after treatment.  Your symptoms come back after treatment.  You are unable to pop your ears.  You have: ? A fever. ? Pain in your ear. ? Pain in your head or neck. ? Fluid draining from your  ear.  Your hearing suddenly changes.  You become very dizzy.  You lose your balance. This information is not intended to replace advice given to you by your health care provider. Make sure you discuss any questions you have with your health care provider. Document Released: 06/19/2015 Document Revised: 10/29/2015 Document Reviewed: 06/11/2014 Elsevier Interactive Patient Education  [name].

## 2017-11-04 ENCOUNTER — Other Ambulatory Visit: Payer: Self-pay | Admitting: Cardiovascular Disease

## 2017-11-04 DIAGNOSIS — B029 Zoster without complications: Secondary | ICD-10-CM | POA: Diagnosis not present

## 2017-11-06 ENCOUNTER — Telehealth: Payer: Self-pay | Admitting: Cardiovascular Disease

## 2017-11-06 NOTE — Telephone Encounter (Signed)
New Message    *STAT* If patient is at the pharmacy, call can be transferred to refill team.   1. Which medications need to be refilled? (please list name of each medication and dose if known) atorvastatin (LIPITOR) 80 MG tablet, lisinopril (PRINIVIL,ZESTRIL) 2.5 MG tablet 2. Which pharmacy/location (including street and city if local pharmacy) is medication to be sent to? [place], [address]  3. Do they need a 30 day or 90 day supply? 90 day

## 2017-11-07 MED ORDER — LISINOPRIL 2.5 MG PO TABS: 2.5000 mg | ORAL_TABLET | Freq: Every day | ORAL | 3 refills | Status: DC

## 2017-11-07 MED ORDER — ATORVASTATIN CALCIUM 80 MG PO TABS: 80.0000 mg | ORAL_TABLET | Freq: Every day | ORAL | 3 refills | Status: DC

## 2017-11-07 NOTE — Telephone Encounter (Signed)
Rx has been sent to the pharmacy electronically. ° °

## 2017-11-15 ENCOUNTER — Encounter: Payer: Self-pay | Admitting: Family Medicine

## 2017-12-11 ENCOUNTER — Ambulatory Visit: Payer: Medicare Other | Admitting: Nurse Practitioner

## 2018-01-03 ENCOUNTER — Other Ambulatory Visit: Payer: Self-pay | Admitting: Cardiovascular Disease

## 2018-01-04 NOTE — Telephone Encounter (Signed)
Rx sent to pharmacy   

## 2018-02-08 ENCOUNTER — Ambulatory Visit: Payer: Medicare Other | Admitting: Family Medicine

## 2018-02-21 DIAGNOSIS — H02834 Dermatochalasis of left upper eyelid: Secondary | ICD-10-CM | POA: Diagnosis not present

## 2018-02-21 DIAGNOSIS — H35033 Hypertensive retinopathy, bilateral: Secondary | ICD-10-CM | POA: Diagnosis not present

## 2018-02-21 DIAGNOSIS — H01022 Squamous blepharitis right lower eyelid: Secondary | ICD-10-CM | POA: Diagnosis not present

## 2018-02-21 DIAGNOSIS — H02831 Dermatochalasis of right upper eyelid: Secondary | ICD-10-CM | POA: Diagnosis not present

## 2018-02-21 DIAGNOSIS — H26492 Other secondary cataract, left eye: Secondary | ICD-10-CM | POA: Diagnosis not present

## 2018-02-21 DIAGNOSIS — H01024 Squamous blepharitis left upper eyelid: Secondary | ICD-10-CM | POA: Diagnosis not present

## 2018-02-21 DIAGNOSIS — H04123 Dry eye syndrome of bilateral lacrimal glands: Secondary | ICD-10-CM | POA: Diagnosis not present

## 2018-02-21 DIAGNOSIS — H01025 Squamous blepharitis left lower eyelid: Secondary | ICD-10-CM | POA: Diagnosis not present

## 2018-02-21 DIAGNOSIS — H01021 Squamous blepharitis right upper eyelid: Secondary | ICD-10-CM | POA: Diagnosis not present

## 2018-02-27 ENCOUNTER — Ambulatory Visit (INDEPENDENT_AMBULATORY_CARE_PROVIDER_SITE_OTHER): Payer: Medicare Other | Admitting: Family Medicine

## 2018-02-27 ENCOUNTER — Encounter: Payer: Self-pay | Admitting: Family Medicine

## 2018-02-27 ENCOUNTER — Ambulatory Visit (INDEPENDENT_AMBULATORY_CARE_PROVIDER_SITE_OTHER): Payer: Medicare Other

## 2018-02-27 VITALS — BP 130/80 | HR 75 | Ht 62.0 in

## 2018-02-27 DIAGNOSIS — R1032 Left lower quadrant pain: Secondary | ICD-10-CM

## 2018-02-27 DIAGNOSIS — M79662 Pain in left lower leg: Secondary | ICD-10-CM | POA: Diagnosis not present

## 2018-02-27 DIAGNOSIS — L821 Other seborrheic keratosis: Secondary | ICD-10-CM | POA: Insufficient documentation

## 2018-02-27 DIAGNOSIS — M79605 Pain in left leg: Secondary | ICD-10-CM | POA: Diagnosis not present

## 2018-02-27 DIAGNOSIS — G8929 Other chronic pain: Secondary | ICD-10-CM | POA: Diagnosis not present

## 2018-02-27 NOTE — Progress Notes (Signed)
Subjective:  Patient ID: Terri [name], female    DOB: 03/06/1946  Age: 72 y.o. MRN: [id number]  CC: No chief complaint on file.   HPI Terri [name] presents for evaluation of ongoing left groin pain.  This is come to ahead back in March where she was seen in the emergency room.  Plain films of her pelvis failed to show any significant arthrosis but did show calcifications of the pelvic vessels.  Pain has subsided to an extent but she still is experiencing pain in the groin area with some tenderness to palpation.  There is been no bulge in this area area.  No changes in her stooling patterns.  Blood in the stool.  No injury.  She is also been experiencing swelling in the left ankle area.  No pain in the ankle.  Swelling resolves over night and tends to gather again during the day when she is up on her feet.  She does not give a history of classic claudication pain.  She does say that there is some pain on occasion when she walks.  She does smoke. Lipid profile   Outpatient Medications Prior to Visit  Medication Sig Dispense Refill  . acetaminophen (TYLENOL) 325 MG tablet Take 650 mg by mouth every 6 (six) hours as needed for mild pain.    Marland Kitchen albuterol (PROVENTIL HFA;VENTOLIN HFA) 108 (90 Base) MCG/ACT inhaler Inhale 1-2 puffs into the lungs every 6 (six) hours as needed for wheezing or shortness of breath. And or cough. 1 Inhaler 0  . atorvastatin (LIPITOR) 80 MG tablet TAKE 1 TABLET BY MOUTH ONCE DAILY 90 tablet 3  . atorvastatin (LIPITOR) 80 MG tablet Take 1 tablet (80 mg total) by mouth daily. 90 tablet 3  . carvedilol (COREG) 3.125 MG tablet TAKE 1 TABLET BY MOUTH TWICE DAILY 180 tablet 3  . cetirizine (ZYRTEC) 10 MG tablet Take 1 tablet (10 mg total) by mouth daily. 30 tablet 1  . clopidogrel (PLAVIX) 75 MG tablet TAKE 1 TABLET BY MOUTH ONCE DAILY 90 tablet 3  . [facility] ASPIRIN 81 MG EC tablet Take 81 mg by mouth daily.  0  . lisinopril (PRINIVIL,ZESTRIL) 2.5 MG tablet TAKE 1 TABLET BY MOUTH ONCE  DAILY 90 tablet 3  . lisinopril (PRINIVIL,ZESTRIL) 2.5 MG tablet Take 1 tablet (2.5 mg total) by mouth daily. 90 tablet 3  . amoxicillin-clavulanate (AUGMENTIN) 875-125 MG tablet Take 1 tablet by mouth 2 (two) times daily. 20 tablet 0  . benzonatate (TESSALON) 200 MG capsule Take 1 capsule (200 mg total) by mouth 2 (two) times daily as needed for cough. 20 capsule 0  . predniSONE (STERAPRED UNI-PAK 21 TAB) 10 MG (21) TBPK tablet Take 6 today, 5 tomorrow and then 4, 3, 2, 1 and stop 21 tablet 0   No facility-administered medications prior to visit.     ROS Review of Systems  Constitutional: Negative for diaphoresis, fatigue, fever and unexpected weight change.  HENT: Negative.   Respiratory: Negative.  Negative for chest tightness and wheezing.   Cardiovascular: Negative for chest pain and palpitations.  Gastrointestinal: Negative for abdominal distention, abdominal pain, anal bleeding, diarrhea, nausea and vomiting.  Endocrine: Negative.   Genitourinary: Negative.   Musculoskeletal: Positive for joint swelling. Negative for arthralgias and gait problem.  Skin: Negative for pallor and rash.  Allergic/Immunologic: Negative for immunocompromised state.  Neurological: Negative for dizziness and headaches.  Hematological: Negative.   Psychiatric/Behavioral: Negative.     Objective:  BP 130/80  Pulse 75   Ht 5\' 2"  (1.575 m)   SpO2 96%   BMI 32.92 kg/m   BP Readings from Last 3 Encounters:  02/27/18 130/80  10/26/17 130/80  10/11/17 136/70    Wt Readings from Last 3 Encounters:  10/26/17 180 lb (81.6 kg)  10/11/17 180 lb 4 oz (81.8 kg)  10/04/17 180 lb (81.6 kg)    Physical Exam  Constitutional: She appears well-developed and well-nourished.  HENT:  Head: Normocephalic.  Right Ear: External ear normal.  Left Ear: External ear normal.  Eyes: Left eye exhibits no discharge.  Neck: No JVD present. No tracheal deviation present. No thyromegaly present.  Cardiovascular:  Normal rate, regular rhythm and normal heart sounds.  Pulses:      Femoral pulses are 1+ on the left side.      Dorsalis pedis pulses are 1+ on the left side.       Posterior tibial pulses are 1+ on the left side.  Pulmonary/Chest: Effort normal and breath sounds normal.  Abdominal: Soft. Bowel sounds are normal. She exhibits no distension and no mass. There is no tenderness. There is no rebound and no guarding. No hernia.  Musculoskeletal:       Left hip: Normal. She exhibits normal range of motion and no bony tenderness.       Left ankle: She exhibits swelling. She exhibits normal range of motion. No tenderness.       Legs: Lymphadenopathy:    She has no cervical adenopathy.  Skin: She is not diaphoretic.       Lab Results  Component Value Date   WBC 10.5 08/25/2017   HGB 13.1 08/25/2017   HCT 39.8 08/25/2017   PLT 230 08/25/2017   GLUCOSE 87 08/25/2017   CHOL 120 09/14/2016   TRIG 272 (H) 09/14/2016   HDL 36 (L) 09/14/2016   LDLCALC 30 09/14/2016   ALT 17 09/14/2016   AST 10 09/14/2016   NA 137 08/25/2017   K 4.1 08/25/2017   CL 105 08/25/2017   CREATININE 0.81 08/25/2017   BUN 11 08/25/2017   CO2 23 08/25/2017   TSH 2.508 01/14/2013   INR 0.93 07/15/2014   HGBA1C 6.0 02/27/2015    Dg Bone Density  Result Date: 10/03/2017 EXAM: DUAL X-RAY ABSORPTIOMETRY (DXA) FOR BONE MINERAL DENSITY IMPRESSION: Dear  Mortimer Fries [facility] Your patient Terri [name] completed a BMD test on 10/03/2017 using the [place] (analysis version: 16.SP2) manufactured by [employer]. The following summarizes the results of our evaluation. PATIENT: Name: Terri [name], Terri [name] Patient ID: [id number] Birth Date: [birth year] Height: 62.0 in. Gender: Female Measured: 10/03/2017 Weight: 178.4 lbs. Indications: Advanced Age, Caucasian, Estrogen Deficiency, Hysterectomy, Low Calcium Intake, Post Menopausal, Tobacco User(Current) Fractures: Treatments: ASSESSMENT: The BMD measured at Femur Neck Left is  0.999 g/cm2 with a T-score of -0.3. This patient is considered normal according to [facility]) criteria. Right hip was not scanned due to hip replacement. L-2 & 3 was excluded due to (egenerative changes. Site Region Measured Date Measured Age WHO YA BMD Classification T-score AP Spine L1-L4 (L2,L3) 10/03/2017 70.9 Normal 3.4 1.571 g/cm2 DualFemur Total Mean 10/03/2017 70.9 years Normal 0.9 1.115 g/cm2 World Health Organization [facility]) criteria for post-menopausal, Caucasian Women: Normal       T-score at or above -1 SD Osteopenia   T-score between -1 and -2.5 SD Osteoporosis T-score at or below -2.5 SD RECOMMENDATION: 1.All patients should optimize calcium and vitamin D intake. 2. Consider FDA-approved  medical therapies in postmenopausal women women and men aged 8 years and older, based on the following: a. A hip or vertebral(clinical or morphometric) fracture. b. T-Score < -2.5 at the femoral neck or spine after appropriate evaluation to exclude secondary causes c. Low bone mass(T-score between -1.0 and -2.5 at the femoral neck or spine) and a 10 year probability of a hip fracture >3% or a 10 year probability of major osteoporosis-related fracture > 20% based on the [place]-adapted WHO algorithm d. Clinical judhement and/or patient preferences may indicate treatment for people with 10-year fracture probabilities above or below these levels FOLLOW-UP: Patients with diagnosis of osteoporosis or at high risk for fracture should have regular bone mineral density tests. For patients eligible for Medicare, routine testing is allowed once every 2 years. The testing frequency can be increased to one year for patients who have rapidly progressing disease, those who are receiving or discontinuing medical therapy to restore bone mass, or have additional risk factors. I have reviewed this report and agree with the above findings. [facility] Radiology Electronically Signed   By: Lowella Grip III M.D.   On:  10/03/2017 10:39   Mm 3d Screen Breast Bilateral  Result Date: 10/03/2017 CLINICAL DATA:  Screening. EXAM: DIGITAL SCREENING BILATERAL MAMMOGRAM WITH TOMO AND CAD COMPARISON:  None. ACR Breast Density Category b: There are scattered areas of fibroglandular density. FINDINGS: There are no findings suspicious for malignancy. Images were processed with CAD. IMPRESSION: No mammographic evidence of malignancy. A result letter of this screening mammogram will be mailed directly to the patient. RECOMMENDATION: Screening mammogram in one year. (Code:[id number]-B-01Y) BI-RADS CATEGORY  1: Negative. Electronically Signed   By: Abelardo Diesel M.D.   On: 10/03/2017 16:02    Assessment & Plan:   Diagnoses and all orders for this visit:  Leg pain, anterior, left -     DG Tibia/Fibula Left; Future -     DG Tibia/Fibula Left  Groin pain, chronic, left -     CT Abdomen Pelvis W Contrast; Future  Seborrheic keratoses -     Ambulatory referral to Dermatology   I have discontinued Reagan D. Baranek's amoxicillin-clavulanate, predniSONE, and benzonatate. I am also having her maintain her CVS ASPIRIN, acetaminophen, cetirizine, albuterol, atorvastatin, lisinopril, lisinopril, atorvastatin, carvedilol, and clopidogrel.  No orders of the defined types were placed in this encounter.    Follow-up: Return in about 1 month (around 03/29/2018), or if symptoms worsen or fail to improve.  Libby Maw, MD

## 2018-02-28 ENCOUNTER — Telehealth: Payer: Self-pay | Admitting: Family Medicine

## 2018-02-28 DIAGNOSIS — I10 Essential (primary) hypertension: Secondary | ICD-10-CM

## 2018-02-28 NOTE — Telephone Encounter (Signed)
Okay 

## 2018-02-28 NOTE — Telephone Encounter (Signed)
Lab ordered & patient is aware to come in this week or next week for lab to be drawn.

## 2018-02-28 NOTE — Telephone Encounter (Signed)
Stacey from [facility] contacted me in reference to scheduling this patient. Before CT can be done, patient needs to have a BMET done before proceeding.

## 2018-02-28 NOTE — Telephone Encounter (Signed)
Okay to order?

## 2018-03-07 ENCOUNTER — Other Ambulatory Visit (INDEPENDENT_AMBULATORY_CARE_PROVIDER_SITE_OTHER): Payer: Medicare Other

## 2018-03-07 DIAGNOSIS — I10 Essential (primary) hypertension: Secondary | ICD-10-CM | POA: Diagnosis not present

## 2018-03-07 LAB — BASIC METABOLIC PANEL
BUN: 14 mg/dL (ref 6–23)
CO2: 24 meq/L (ref 19–32)
CREATININE: 0.88 mg/dL (ref 0.40–1.20)
Calcium: 9.2 mg/dL (ref 8.4–10.5)
Chloride: 105 mEq/L (ref 96–112)
GFR: 67.25 mL/min (ref >60.00)
Glucose, Bld: 97 mg/dL (ref 70–99)
Potassium: 3.8 mEq/L (ref 3.5–5.1)
SODIUM: 138 meq/L (ref 135–145)

## 2018-03-13 ENCOUNTER — Ambulatory Visit (INDEPENDENT_AMBULATORY_CARE_PROVIDER_SITE_OTHER)
Admission: RE | Admit: 2018-03-13 | Discharge: 2018-03-13 | Disposition: A | Discharge status: Home / Self Care | Payer: Medicare Other | Source: Ambulatory Visit | Attending: Family Medicine | Admitting: Family Medicine

## 2018-03-13 DIAGNOSIS — D3502 Benign neoplasm of left adrenal gland: Secondary | ICD-10-CM | POA: Diagnosis not present

## 2018-03-13 DIAGNOSIS — R1032 Left lower quadrant pain: Secondary | ICD-10-CM | POA: Diagnosis not present

## 2018-03-13 DIAGNOSIS — D3501 Benign neoplasm of right adrenal gland: Secondary | ICD-10-CM | POA: Diagnosis not present

## 2018-03-13 DIAGNOSIS — G8929 Other chronic pain: Secondary | ICD-10-CM

## 2018-03-13 MED ORDER — IOPAMIDOL (ISOVUE-300) INJECTION 61%
100.0000 mL | Freq: Once | INTRAVENOUS | Status: AC | PRN
  Administered 2018-03-13: 100 mL via INTRAVENOUS

## 2018-03-29 ENCOUNTER — Encounter: Payer: Self-pay | Admitting: Family Medicine

## 2018-03-29 ENCOUNTER — Ambulatory Visit (INDEPENDENT_AMBULATORY_CARE_PROVIDER_SITE_OTHER): Payer: Medicare Other | Admitting: Family Medicine

## 2018-03-29 VITALS — BP 126/80 | HR 66 | Temp 97.9°F | Ht 62.0 in | Wt 186.5 lb

## 2018-03-29 DIAGNOSIS — E7849 Other hyperlipidemia: Secondary | ICD-10-CM | POA: Diagnosis not present

## 2018-03-29 DIAGNOSIS — R1032 Left lower quadrant pain: Secondary | ICD-10-CM

## 2018-03-29 DIAGNOSIS — G8929 Other chronic pain: Secondary | ICD-10-CM

## 2018-03-29 DIAGNOSIS — I739 Peripheral vascular disease, unspecified: Secondary | ICD-10-CM | POA: Diagnosis not present

## 2018-03-29 DIAGNOSIS — H9111 Presbycusis, right ear: Secondary | ICD-10-CM | POA: Diagnosis not present

## 2018-03-29 NOTE — Progress Notes (Signed)
Subjective:  Patient ID: Terri [name], female    DOB: 12-Jul-1945  Age: 72 y.o. MRN: [id number]  CC: Follow-up   HPI Terri [name] presents for follow-up of her left groin and anterior leg pain.  CT scanning was negative for acute intra-abdominal pathology but did show peripheral vascular disease.  Patient is taking her high dose statin.  She continues to smoke about 4 to 5 cigarettes daily.  Discussed diverticulosis seen on CT scan and what symptoms of signs and symptoms would be likely to happen with diverticulitis.  Her hearing in the right ear is slowly diminishing.  She is interested in amplification therapy.  Outpatient Medications Prior to Visit  Medication Sig Dispense Refill  . acetaminophen (TYLENOL) 325 MG tablet Take 650 mg by mouth every 6 (six) hours as needed for mild pain.    Marland Kitchen albuterol (PROVENTIL HFA;VENTOLIN HFA) 108 (90 Base) MCG/ACT inhaler Inhale 1-2 puffs into the lungs every 6 (six) hours as needed for wheezing or shortness of breath. And or cough. 1 Inhaler 0  . atorvastatin (LIPITOR) 80 MG tablet Take 1 tablet (80 mg total) by mouth daily. 90 tablet 3  . carvedilol (COREG) 3.125 MG tablet TAKE 1 TABLET BY MOUTH TWICE DAILY 180 tablet 3  . cetirizine (ZYRTEC) 10 MG tablet Take 1 tablet (10 mg total) by mouth daily. 30 tablet 1  . clopidogrel (PLAVIX) 75 MG tablet TAKE 1 TABLET BY MOUTH ONCE DAILY 90 tablet 3  . CVS ASPIRIN 81 MG EC tablet Take 81 mg by mouth daily.  0  . lisinopril (PRINIVIL,ZESTRIL) 2.5 MG tablet Take 1 tablet (2.5 mg total) by mouth daily. 90 tablet 3  . atorvastatin (LIPITOR) 80 MG tablet TAKE 1 TABLET BY MOUTH ONCE DAILY 90 tablet 3  . lisinopril (PRINIVIL,ZESTRIL) 2.5 MG tablet TAKE 1 TABLET BY MOUTH ONCE DAILY 90 tablet 3   No facility-administered medications prior to visit.     ROS Review of Systems  Constitutional: Negative.   HENT: Positive for hearing loss. Negative for ear discharge and ear pain.   Respiratory: Negative.     Cardiovascular: Negative.   Gastrointestinal: Negative.   Hematological: Negative.   Psychiatric/Behavioral: Negative.     Objective:  BP 126/80   Pulse 66   Temp 97.9 F (36.6 C) (Oral)   Ht 5\' 2"  (1.575 m)   Wt 186 lb 8 oz (84.6 kg)   SpO2 97%   BMI 34.11 kg/m   BP Readings from Last 3 Encounters:  03/29/18 126/80  02/27/18 130/80  10/26/17 130/80    Wt Readings from Last 3 Encounters:  03/29/18 186 lb 8 oz (84.6 kg)  10/26/17 180 lb (81.6 kg)  10/11/17 180 lb 4 oz (81.8 kg)    Physical Exam  Constitutional: She is oriented to person, place, and time. She appears well-developed and well-nourished.  HENT:  Head: Normocephalic and atraumatic.  Right Ear: External ear normal.  Left Ear: External ear normal.  Mouth/Throat: Oropharynx is clear and moist.  Eyes: Right eye exhibits no discharge. Left eye exhibits no discharge. No scleral icterus.  Pulmonary/Chest: Effort normal.  Neurological: She is alert and oriented to person, place, and time.  Skin: Skin is warm and dry. She is not diaphoretic.  Psychiatric: She has a normal mood and affect. Her behavior is normal.    Lab Results  Component Value Date   WBC 10.5 08/25/2017   HGB 13.1 08/25/2017   HCT 39.8 08/25/2017   PLT 230 08/25/2017  GLUCOSE 97 03/07/2018   CHOL 120 09/14/2016   TRIG 272 (H) 09/14/2016   HDL 36 (L) 09/14/2016   LDLCALC 30 09/14/2016   ALT 17 09/14/2016   AST 10 09/14/2016   NA 138 03/07/2018   K 3.8 03/07/2018   CL 105 03/07/2018   CREATININE 0.88 03/07/2018   BUN 14 03/07/2018   CO2 24 03/07/2018   TSH 2.508 01/14/2013   INR 0.93 07/15/2014   HGBA1C 6.0 02/27/2015    Ct Abdomen Pelvis W Contrast  Result Date: 03/14/2018 CLINICAL DATA:  Intermittent left groin and leg swelling. Groin pain, chronic, left. EXAM: CT ABDOMEN AND PELVIS WITH CONTRAST TECHNIQUE: Multidetector CT imaging of the abdomen and pelvis was performed using the standard protocol following bolus  administration of intravenous contrast. CONTRAST:  126mL ISOVUE-300 IOPAMIDOL (ISOVUE-300) INJECTION 61% COMPARISON:  None. FINDINGS: Lower chest: The lung bases are clear without focal nodule, mass, or airspace disease. Atherosclerotic calcifications are present. Heart size is normal. Fat density in the left ventricle may reflect prior infarct. No significant pleural or pericardial effusion is present. Hepatobiliary: Pneumobilia is within normal limits following cholecystectomy and possible sphincterotomy. There is mild fatty infiltration of the liver. No focal lesions are present. The common bile duct is otherwise within normal limits. Pancreas: Unremarkable. No pancreatic ductal dilatation or surrounding inflammatory changes. Spleen: Normal in size without focal abnormality. Adrenals/Urinary Tract: Bilateral low density adrenal mass lesions are present. Both lesions demonstrate significant washout on the delayed images compatible with adrenal adenomas. The right-sided lesion measures 1.4 cm. The left-sided lesion measures 1.7 cm. A 3.5 cm exophytic cyst is present at the lower pole of the left kidney. No other significant mass lesion is present in either kidney. Parenchyma is within normal limits. Ureters and urinary bladder are normal. Stomach/Bowel: The stomach and duodenum are within normal limits. Small bowel is unremarkable. The terminal ileum is within normal limits. Appendix is visualized and normal. The ascending and transverse colon are normal. Diverticular changes are present in the distal descending and sigmoid colon. No focal inflammation is present to suggest diverticulitis. Vascular/Lymphatic: Atherosclerotic calcifications are present within the aorta and branch vessels. There is no aneurysm. Maximal transverse diameter is 2.7 cm. Reproductive: Status post hysterectomy. No adnexal masses. Other: No abdominal wall hernia or abnormality. No abdominopelvic ascites. Musculoskeletal: Slight  retrolisthesis is present at L5-S1. There is minimal retrolisthesis at L2-3 and L3-4. There straightening of the normal lumbar lordosis. Leftward curvature of the lumbar spine is centered at L2-3. Vertebral body heights are maintained. Endplate sclerotic changes are present on the left at L5-S1 and on the right at L2-3. Osseous foraminal narrowing is present bilaterally at L5-S1 and on the left L4-5. Foraminal narrowing is worse on the right at L2-3. Bony pelvis is within normal limits. The hips are located and normal bilaterally. IMPRESSION: 1. No significant inguinal hernia or focal lesion to explain the patient's symptoms in the left groin. 2. Distal descending and sigmoid diverticulosis. Intermittent diverticulitis could result in left lower quadrant pain. 3.  Aortic Atherosclerosis (ICD10-I70.0). 4. Cholecystectomy. 5. Bilateral adrenal adenomas. 6. Coronary artery disease. Electronically Signed   By: San Morelle M.D.   On: 03/14/2018 09:24    Assessment & Plan:   Lynde was seen today for follow-up.  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 Kuennen maintain her CVS ASPIRIN, acetaminophen, cetirizine, albuterol, lisinopril, atorvastatin, carvedilol, and clopidogrel.  No orders of the defined types were placed in this encounter.  Believe that patient's left groin and anterior leg pain have a musculoskeletal origin.  We discussed the sports medicine referral and she will let me know.  Could also have a peripheral vascular disease origin and I encouraged her to continue taking her current medicines and have her lipid profile checked that is been previously ordered by her cardiologist neurologist.  Also encouraged her to stop smoking.  Follow-up: Return if symptoms  worsen or fail to improve.  Libby Maw, MD

## 2018-04-23 DIAGNOSIS — IMO0001 Reserved for inherently not codable concepts without codable children: Secondary | ICD-10-CM | POA: Insufficient documentation

## 2018-04-23 DIAGNOSIS — H903 Sensorineural hearing loss, bilateral: Secondary | ICD-10-CM | POA: Diagnosis not present

## 2018-06-24 DIAGNOSIS — B029 Zoster without complications: Secondary | ICD-10-CM | POA: Diagnosis not present

## 2018-08-17 ENCOUNTER — Other Ambulatory Visit: Payer: Self-pay

## 2018-08-17 ENCOUNTER — Ambulatory Visit (INDEPENDENT_AMBULATORY_CARE_PROVIDER_SITE_OTHER): Payer: Medicare Other | Admitting: Family Medicine

## 2018-08-17 ENCOUNTER — Encounter: Payer: Self-pay | Admitting: Family Medicine

## 2018-08-17 VITALS — BP 130/80 | HR 71 | Temp 97.8°F | Ht 62.0 in | Wt 187.1 lb

## 2018-08-17 DIAGNOSIS — Z720 Tobacco use: Secondary | ICD-10-CM

## 2018-08-17 DIAGNOSIS — J22 Unspecified acute lower respiratory infection: Secondary | ICD-10-CM | POA: Insufficient documentation

## 2018-08-17 DIAGNOSIS — J301 Allergic rhinitis due to pollen: Secondary | ICD-10-CM

## 2018-08-17 MED ORDER — AMOXICILLIN 500 MG PO CAPS: 500.0000 mg | ORAL_CAPSULE | Freq: Three times a day (TID) | ORAL | 0 refills | Status: DC

## 2018-08-17 NOTE — Progress Notes (Signed)
Established Patient Office Visit  Subjective:  Patient ID: Terri [name], female    DOB: [birth year]  Age: 74 y.o. MRN: [id number]  CC:  Chief Complaint  Patient presents with  . Cough    x a week, productive cough, no fever.    HPI Terri [name] presents for evaluation and treatment of a 2-week history of nasal congestion postnasal drip and a cough that is been productive of yellow to green phlegm.  Patient denies fever or chills.  There is been no increase in her reactive airway disease.  She does continue to smoke.  She does have a history of springtime allergies and has been sneezing.  Denies increased inhaler use.  She would prefer not to use nasally injectable medicines.  Past Medical History:  Diagnosis Date  . Aortic insufficiency    a. Mild AI by echo 2014.  . Arthritis   . CAD (coronary artery disease)    a. Anterior STEMI 01/2015 s/p overlapping DES x2 to prox LAD, DES to prox RCA in [facility].  . Chronic back pain    stenosis/HNP  . History of bronchitis 2014  . History of colon polyps   . Hyperlipidemia   . Ischemic cardiomyopathy   . Joint pain   . Joint swelling   . Pneumonia 2014  . Smokers' cough ([place])   . Stress headaches   . Tobacco abuse     Past Surgical History:  Procedure Laterality Date  . ABDOMINAL HYSTERECTOMY     at age 38  . BREAST BIOPSY Right    roughly 30 years ago with [facility]   . cataract surgery Bilateral   . CHOLECYSTECTOMY    . colonscopy    . ERCP    . ESOPHAGOGASTRODUODENOSCOPY    . LUMBAR LAMINECTOMY Right 07/25/2014   Procedure: Right L4 Hemilaminectomy, Right L4-5 Laminotomy , Decompression, Microdisectomy;  Surgeon: Marybelle Killings, MD;  Location: [place];  Service: Orthopedics;  Laterality: Right;  . SHOULDER SURGERY Right   . TONSILLECTOMY     as a child    Family History  Problem Relation Age of Onset  . Heart disease Mother   . Heart failure Mother   . Heart disease Father   . Heart failure Father   .  CAD Unknown        both parents and multiple siblings with either died or had-related issues    Social History   Socioeconomic History  . Marital status: Married    Spouse name: Not on file  . Number of children: Not on file  . Years of education: Not on file  . Highest education level: Not on file  Occupational History  . Not on file  Social Needs  . Financial resource strain: Not on file  . Food insecurity:    Worry: Not on file    Inability: Not on file  . Transportation needs:    Medical: Not on file    Non-medical: Not on file  Tobacco Use  . Smoking status: Current Every Day Smoker    Packs/day: 0.50    Years: 50.00    Pack years: 25.00    Types: Cigarettes  . Smokeless tobacco: Never Used  Substance and Sexual Activity  . Alcohol use: No  . Drug use: No  . Sexual activity: Yes    Birth control/protection: Surgical  Lifestyle  . Physical activity:    Days per week: Not on file    Minutes per  session: Not on file  . Stress: Not on file  Relationships  . Social connections:    Talks on phone: Not on file    Gets together: Not on file    Attends religious service: Not on file    Active member of club or organization: Not on file    Attends meetings of clubs or organizations: Not on file    Relationship status: Not on file  . Intimate partner violence:    Fear of current or ex partner: Not on file    Emotionally abused: Not on file    Physically abused: Not on file    Forced sexual activity: Not on file  Other Topics Concern  . Not on file  Social History Narrative  . Not on file    Outpatient Medications Prior to Visit  Medication Sig Dispense Refill  . acetaminophen (TYLENOL) 325 MG tablet Take 650 mg by mouth every 6 (six) hours as needed for mild pain.    Marland Kitchen albuterol (PROVENTIL HFA;VENTOLIN HFA) 108 (90 Base) MCG/ACT inhaler Inhale 1-2 puffs into the lungs every 6 (six) hours as needed for wheezing or shortness of breath. And or cough. 1 Inhaler 0   . atorvastatin (LIPITOR) 80 MG tablet Take 1 tablet (80 mg total) by mouth daily. 90 tablet 3  . carvedilol (COREG) 3.125 MG tablet TAKE 1 TABLET BY MOUTH TWICE DAILY 180 tablet 3  . cetirizine (ZYRTEC) 10 MG tablet Take 1 tablet (10 mg total) by mouth daily. 30 tablet 1  . clopidogrel (PLAVIX) 75 MG tablet TAKE 1 TABLET BY MOUTH ONCE DAILY 90 tablet 3  . [facility] ASPIRIN 81 MG EC tablet Take 81 mg by mouth daily.  0  . lisinopril (PRINIVIL,ZESTRIL) 2.5 MG tablet Take 1 tablet (2.5 mg total) by mouth daily. 90 tablet 3   No facility-administered medications prior to visit.     Allergies  Allergen Reactions  . Erythromycin     nauseated    ROS Review of Systems  Constitutional: Negative for chills, diaphoresis, fatigue, fever and unexpected weight change.  HENT: Positive for congestion, postnasal drip, rhinorrhea and sneezing. Negative for sinus pressure, sinus pain, sore throat and voice change.   Eyes: Negative for photophobia and visual disturbance.  Respiratory: Positive for cough. Negative for shortness of breath and wheezing.   Cardiovascular: Negative.   Gastrointestinal: Negative.   Endocrine: Negative for polyphagia and polyuria.  Genitourinary: Negative.   Musculoskeletal: Negative for arthralgias and myalgias.  Skin: Negative for pallor.  Allergic/Immunologic: Negative for immunocompromised state.  Neurological: Negative for light-headedness, numbness and headaches.  Hematological: Does not bruise/bleed easily.  Psychiatric/Behavioral: Negative.       Objective:    Physical Exam  Constitutional: She is oriented to person, place, and time. She appears well-developed and well-nourished. No distress.  HENT:  Head: Normocephalic and atraumatic.  Right Ear: External ear normal.  Left Ear: External ear normal.  Mouth/Throat: Oropharynx is clear and moist. No oropharyngeal exudate.  Eyes: Pupils are equal, round, and reactive to light. Conjunctivae are normal. Right eye  exhibits no discharge. Left eye exhibits no discharge. No scleral icterus.  Neck: Neck supple. No JVD present. No tracheal deviation present. No thyromegaly present.  Cardiovascular: Normal rate, regular rhythm and normal heart sounds.  Pulmonary/Chest: Effort normal and breath sounds normal. No stridor. No respiratory distress. She has no wheezes. She has no rales.  Abdominal: Bowel sounds are normal.  Musculoskeletal:        General: No  edema.  Lymphadenopathy:    She has no cervical adenopathy.  Neurological: She is alert and oriented to person, place, and time.  Skin: Skin is warm and dry. She is not diaphoretic.  Psychiatric: She has a normal mood and affect. Her behavior is normal.    BP 130/80   Pulse 71   Temp 97.8 F (36.6 C) (Oral)   Ht 5\' 2"  (1.575 m)   Wt 187 lb 2 oz (84.9 kg)   SpO2 96%   BMI 34.23 kg/m  Wt Readings from Last 3 Encounters:  08/17/18 187 lb 2 oz (84.9 kg)  03/29/18 186 lb 8 oz (84.6 kg)  10/26/17 180 lb (81.6 kg)   BP Readings from Last 3 Encounters:  08/17/18 130/80  03/29/18 126/80  02/27/18 130/80   Guideline developer:  UpToDate (see UpToDate for funding source) Date Released: June 2014  Health Maintenance Due  Topic Date Due  . Hepatitis C Screening  05/12/1946  . TETANUS/TDAP  10/07/1965  . COLONOSCOPY  10/07/1996    There are no preventive care reminders to display for this patient.  Lab Results  Component Value Date   TSH 2.508 01/14/2013   Lab Results  Component Value Date   WBC 10.5 08/25/2017   HGB 13.1 08/25/2017   HCT 39.8 08/25/2017   MCV 88.4 08/25/2017   PLT 230 08/25/2017   Lab Results  Component Value Date   NA 138 03/07/2018   K 3.8 03/07/2018   CO2 24 03/07/2018   GLUCOSE 97 03/07/2018   BUN 14 03/07/2018   CREATININE 0.88 03/07/2018   BILITOT 0.4 09/14/2016   ALKPHOS 123 09/14/2016   AST 10 09/14/2016   ALT 17 09/14/2016   PROT 6.1 09/14/2016   ALBUMIN 3.8 09/14/2016   CALCIUM 9.2 03/07/2018    ANIONGAP 9 08/25/2017   GFR 67.25 03/07/2018   Lab Results  Component Value Date   CHOL 120 09/14/2016   Lab Results  Component Value Date   HDL 36 (L) 09/14/2016   Lab Results  Component Value Date   LDLCALC 30 09/14/2016   Lab Results  Component Value Date   TRIG 272 (H) 09/14/2016   Lab Results  Component Value Date   CHOLHDL 3.3 09/14/2016   Lab Results  Component Value Date   HGBA1C 6.0 02/27/2015      Assessment & Plan:   Problem List Items Addressed This Visit      Respiratory   Seasonal allergic rhinitis due to pollen   Lower respiratory infection - Primary   Relevant Medications   amoxicillin (AMOXIL) 500 MG capsule    Other Visit Diagnoses    Tobacco use          Meds ordered this encounter  Medications  . amoxicillin (AMOXIL) 500 MG capsule    Sig: Take 1 capsule (500 mg total) by mouth 3 (three) times daily.    Dispense:  30 capsule    Refill:  0    Follow-up: Return if symptoms worsen or fail to improve.   Patient was given information on allergy rhinitis and steps to quit smoking.

## 2018-08-17 NOTE — Patient Instructions (Signed)
Allergic Rhinitis, Adult Allergic rhinitis is a reaction to allergens in the air. Allergens are tiny specks (particles) in the air that cause your body to have an allergic reaction. This condition cannot be passed from person to person (is not contagious). Allergic rhinitis cannot be cured, but it can be controlled. There are two types of allergic rhinitis:  Seasonal. This type is also called hay fever. It happens only during certain times of the year.  Perennial. This type can happen at any time of the year. What are the causes? This condition may be caused by:  Pollen from grasses, trees, and weeds.  House dust mites.  Pet dander.  Mold. What are the signs or symptoms? Symptoms of this condition include:  Sneezing.  Runny or stuffy nose (nasal congestion).  A lot of mucus in the back of the throat (postnasal drip).  Itchy nose.  Tearing of the eyes.  Trouble sleeping.  Being sleepy during day. How is this treated? There is no cure for this condition. You should avoid things that trigger your symptoms (allergens). Treatment can help to relieve symptoms. This may include:  Medicines that block allergy symptoms, such as antihistamines. These may be given as a shot, nasal spray, or pill.  Shots that are given until your body becomes less sensitive to the allergen (desensitization).  Stronger medicines, if all other treatments have not worked. Follow these instructions at home: Avoiding allergens   Find out what you are allergic to. Common allergens include smoke, dust, and pollen.  Avoid them if you can. These are some of the things that you can do to avoid allergens: ? Replace carpet with wood, tile, or vinyl flooring. Carpet can trap dander and dust. ? Clean any mold found in the home. ? Do not smoke. Do not allow smoking in your home. ? Change your heating and air conditioning filter at least once a month. ? During allergy season:  Keep windows closed as much as  you can. If possible, use air conditioning when there is a lot of pollen in the air.  Use a special filter for allergies with your furnace and air conditioner.  Plan outdoor activities when pollen counts are lowest. This is usually during the early morning or evening hours.  If you do go outdoors when pollen count is high, wear a special mask for people with allergies.  When you come indoors, take a shower and change your clothes before sitting on furniture or bedding. General instructions  Do not use fans in your home.  Do not hang clothes outside to dry.  Wear sunglasses to keep pollen out of your eyes.  Wash your hands right away after you touch household pets.  Take over-the-counter and prescription medicines only as told by your doctor.  Keep all follow-up visits as told by your doctor. This is important. Contact a doctor if:  You have a fever.  You have a cough that does not go away (is persistent).  You start to make whistling sounds when you breathe (wheeze).  Your symptoms do not get better with treatment.  You have thick fluid coming from your nose.  You start to have nosebleeds. Get help right away if:  Your tongue or your lips are swollen.  You have trouble breathing.  You feel dizzy or you feel like you are going to pass out (faint).  You have cold sweats. Summary  Allergic rhinitis is a reaction to allergens in the air.  This condition may be   caused by allergens. These include pollen, dust mites, pet dander, and mold.  Symptoms include a runny, itchy nose, sneezing, or tearing eyes. You may also have trouble sleeping or feel sleepy during the day.  Treatment includes taking medicines and avoiding allergens. You may also get shots or take stronger medicines.  Get help if you have a fever or a cough that does not stop. Get help right away if you are short of breath. This information is not intended to replace advice given to you by your health care  provider. Make sure you discuss any questions you have with your health care provider. Document Released: 09/22/2010 Document Revised: 12/12/2017 Document Reviewed: 12/12/2017 Elsevier Interactive Patient Education  2019 [employer].  Steps to Quit Smoking  Smoking tobacco can be harmful to your health and can affect almost every organ in your body. Smoking puts you, and those around you, at risk for developing many serious chronic diseases. Quitting smoking is difficult, but it is one of the best things that you can do for your health. It is never too late to quit. What are the benefits of quitting smoking? When you quit smoking, you lower your risk of developing serious diseases and conditions, such as:  Lung cancer or lung disease, such as COPD.  Heart disease.  Stroke.  Heart attack.  Infertility.  Osteoporosis and bone fractures. Additionally, symptoms such as coughing, wheezing, and shortness of breath may get better when you quit. You may also find that you get sick less often because your body is stronger at fighting off colds and infections. If you are pregnant, quitting smoking can help to reduce your chances of having a baby of low birth weight. How do I get ready to quit? When you decide to quit smoking, create a plan to make sure that you are successful. Before you quit:  Pick a date to quit. Set a date within the next two weeks to give you time to prepare.  Write down the reasons why you are quitting. Keep this list in places where you will see it often, such as on your bathroom mirror or in your car or wallet.  Identify the people, places, things, and activities that make you want to smoke (triggers) and avoid them. Make sure to take these actions: ? Throw away all cigarettes at home, at work, and in your car. ? Throw away smoking accessories, such as Scientist, research (medical). ? Clean your car and make sure to empty the ashtray. ? Clean your home, including curtains and  carpets.  Tell your family, friends, and coworkers that you are quitting. Support from your loved ones can make quitting easier.  Talk with your health care provider about your options for quitting smoking.  Find out what treatment options are covered by your health insurance. What strategies can I use to quit smoking? Talk with your healthcare provider about different strategies to quit smoking. Some strategies include:  Quitting smoking altogether instead of gradually lessening how much you smoke over a period of time. Research shows that quitting "cold [place]" is more successful than gradually quitting.  Attending in-person counseling to help you build problem-solving skills. You are more likely to have success in quitting if you attend several counseling sessions. Even short sessions of 10 minutes can be effective.  Finding resources and support systems that can help you to quit smoking and remain smoke-free after you quit. These resources are most helpful when you use them often. They can include: ?  Online chats with a Social worker. ? Telephone quitlines. ? Careers information officer. ? Support groups or group counseling. ? Text messaging programs. ? Mobile phone applications.  Taking medicines to help you quit smoking. (If you are pregnant or breastfeeding, talk with your health care provider first.) Some medicines contain nicotine and some do not. Both types of medicines help with cravings, but the medicines that include nicotine help to relieve withdrawal symptoms. Your health care provider may recommend: ? Nicotine patches, gum, or lozenges. ? Nicotine inhalers or sprays. ? Non-nicotine medicine that is taken by mouth. Talk with your health care provider about combining strategies, such as taking medicines while you are also receiving in-person counseling. Using these two strategies together makes you more likely to succeed in quitting than if you used either strategy on its own. If  you are pregnant or breastfeeding, talk with your health care provider about finding counseling or other support strategies to quit smoking. Do not take medicine to help you quit smoking unless told to do so by your health care provider. What things can I do to make it easier to quit? Quitting smoking might feel overwhelming at first, but there is a lot that you can do to make it easier. Take these important actions:  Reach out to your family and friends and ask that they support and encourage you during this time. Call telephone quitlines, reach out to support groups, or work with a counselor for support.  Ask people who smoke to avoid smoking around you.  Avoid places that trigger you to smoke, such as bars, parties, or smoke-break areas at work.  Spend time around people who do not smoke.  Lessen stress in your life, because stress can be a smoking trigger for some people. To lessen stress, try: ? Exercising regularly. ? Deep-breathing exercises. ? Yoga. ? Meditating. ? Performing a body scan. This involves closing your eyes, scanning your body from head to toe, and noticing which parts of your body are particularly tense. Purposefully relax the muscles in those areas.  Download or purchase mobile phone or tablet apps (applications) that can help you stick to your quit plan by providing reminders, tips, and encouragement. There are many free apps, such as QuitGuide from the [employer] Office manager for Disease Control and Prevention). You can find other support for quitting smoking (smoking cessation) through smokefree.gov and other websites. How will I feel when I quit smoking? Within the first 24 hours of quitting smoking, you may start to feel some withdrawal symptoms. These symptoms are usually most noticeable 2-3 days after quitting, but they usually do not last beyond 2-3 weeks. Changes or symptoms that you might experience include:  Mood swings.  Restlessness, anxiety, or irritation.   Difficulty concentrating.  Dizziness.  Strong cravings for sugary foods in addition to nicotine.  Mild weight gain.  Constipation.  Nausea.  Coughing or a sore throat.  Changes in how your medicines work in your body.  A depressed mood.  Difficulty sleeping (insomnia). After the first 2-3 weeks of quitting, you may start to notice more positive results, such as:  Improved sense of smell and taste.  Decreased coughing and sore throat.  Slower heart rate.  Lower blood pressure.  Clearer skin.  The ability to breathe more easily.  Fewer sick days. Quitting smoking is very challenging for most people. Do not get discouraged if you are not successful the first time. Some people need to make many attempts to quit before they achieve long-term  success. Do your best to stick to your quit plan, and talk with your health care provider if you have any questions or concerns. This information is not intended to replace advice given to you by your health care provider. Make sure you discuss any questions you have with your health care provider. Document Released: 05/17/2001 Document Revised: 12/27/2016 Document Reviewed: 10/07/2014 Elsevier Interactive Patient Education  2019 [employer].

## 2018-11-01 DIAGNOSIS — R21 Rash and other nonspecific skin eruption: Secondary | ICD-10-CM | POA: Diagnosis not present

## 2018-11-01 DIAGNOSIS — B029 Zoster without complications: Secondary | ICD-10-CM | POA: Diagnosis not present

## 2018-11-17 ENCOUNTER — Other Ambulatory Visit: Payer: Self-pay | Admitting: Cardiovascular Disease

## 2018-12-05 ENCOUNTER — Other Ambulatory Visit (HOSPITAL_BASED_OUTPATIENT_CLINIC_OR_DEPARTMENT_OTHER): Payer: Self-pay | Admitting: Family Medicine

## 2018-12-05 DIAGNOSIS — Z1231 Encounter for screening mammogram for malignant neoplasm of breast: Secondary | ICD-10-CM

## 2018-12-10 ENCOUNTER — Encounter (HOSPITAL_BASED_OUTPATIENT_CLINIC_OR_DEPARTMENT_OTHER): Payer: Self-pay

## 2018-12-10 ENCOUNTER — Other Ambulatory Visit: Payer: Self-pay

## 2018-12-10 ENCOUNTER — Ambulatory Visit (HOSPITAL_BASED_OUTPATIENT_CLINIC_OR_DEPARTMENT_OTHER)
Admission: RE | Admit: 2018-12-10 | Discharge: 2018-12-10 | Disposition: A | Discharge status: Home / Self Care | Payer: Medicare Other | Source: Ambulatory Visit | Attending: Family Medicine | Admitting: Family Medicine

## 2018-12-10 DIAGNOSIS — Z1231 Encounter for screening mammogram for malignant neoplasm of breast: Secondary | ICD-10-CM | POA: Diagnosis not present

## 2019-01-17 ENCOUNTER — Telehealth: Payer: Self-pay | Admitting: Pharmacist

## 2019-01-17 DIAGNOSIS — E7849 Other hyperlipidemia: Secondary | ICD-10-CM

## 2019-01-17 NOTE — Telephone Encounter (Signed)
Lipid panel and LFTs order renewed

## 2019-01-18 LAB — HEPATIC FUNCTION PANEL
ALT: 15 IU/L (ref 0–32)
AST: 11 IU/L (ref 0–40)
Albumin: 4.3 g/dL (ref 3.7–4.7)
Alkaline Phosphatase: 135 IU/L — ABNORMAL HIGH (ref 39–117)
Bilirubin Total: 0.3 mg/dL (ref 0.0–1.2)
Bilirubin, Direct: 0.13 mg/dL (ref 0.00–0.40)
Total Protein: 6.1 g/dL (ref 6.0–8.5)

## 2019-01-18 LAB — LIPID PANEL WITH LDL/HDL RATIO
Cholesterol, Total: 129 mg/dL (ref 100–199)
HDL: 41 mg/dL (ref >39)
LDL Calculated: 55 mg/dL (ref 0–99)
LDl/HDL Ratio: 1.3 ratio (ref 0.0–3.2)
Triglycerides: 163 mg/dL — ABNORMAL HIGH (ref 0–149)
VLDL Cholesterol Cal: 33 mg/dL (ref 5–40)

## 2019-01-21 ENCOUNTER — Encounter: Payer: Self-pay | Admitting: *Deleted

## 2019-01-22 ENCOUNTER — Ambulatory Visit (INDEPENDENT_AMBULATORY_CARE_PROVIDER_SITE_OTHER): Payer: Medicare Other | Admitting: Cardiovascular Disease

## 2019-01-22 ENCOUNTER — Encounter: Payer: Self-pay | Admitting: Cardiovascular Disease

## 2019-01-22 ENCOUNTER — Other Ambulatory Visit: Payer: Self-pay

## 2019-01-22 VITALS — BP 160/74 | HR 58 | Ht 62.0 in | Wt 185.6 lb

## 2019-01-22 DIAGNOSIS — Z008 Encounter for other general examination: Secondary | ICD-10-CM

## 2019-01-22 DIAGNOSIS — I10 Essential (primary) hypertension: Secondary | ICD-10-CM

## 2019-01-22 DIAGNOSIS — Z720 Tobacco use: Secondary | ICD-10-CM

## 2019-01-22 DIAGNOSIS — I2510 Atherosclerotic heart disease of native coronary artery without angina pectoris: Secondary | ICD-10-CM

## 2019-01-22 DIAGNOSIS — E782 Mixed hyperlipidemia: Secondary | ICD-10-CM

## 2019-01-22 MED ORDER — LISINOPRIL 5 MG PO TABS: 5.0000 mg | ORAL_TABLET | Freq: Every day | ORAL | 3 refills | Status: DC

## 2019-01-22 NOTE — Patient Instructions (Addendum)
Medication Instructions:  Your physician has recommended you make the following change in your medication:  INCREASE YOUR LISINOPRIL TO 5 MG BY MOUTH DAILY  If you need a refill on your cardiac medications before your next appointment, please call your pharmacy.   Lab work: NONE If you have labs (blood work) drawn today and your tests are completely normal, you will receive your results only by: Marland Kitchen MyChart Message (if you have MyChart) OR . A paper copy in the mail If you have any lab test that is abnormal or we need to change your treatment, we will call you to review the results.  Testing/Procedures: NONE  Follow-Up: At [facility], you and your health needs are our priority.  As part of our continuing mission to provide you with exceptional heart care, we have created designated Provider Care Teams.  These Care Teams include your primary Cardiologist (physician) and Advanced Practice Providers (APPs -  Physician Assistants and Nurse Practitioners) who all work together to provide you with the care you need, when you need it. You will need a follow up appointment in 12 months with Dr. Quay Burow.  Please call our office 2 months in advance to schedule this/each appointment.

## 2019-01-22 NOTE — Progress Notes (Signed)
01/22/2019 Terri [name]   06-Jul-1945  [id number]  Primary Physician Libby Maw, MD Primary Cardiologist: Lorretta Harp MD Lupe Carney, [place]  HPI:  Terri [name] is a 73 y.o.    mildly overweight married Caucasian female mother of 2 children whose husband Terri [name] is also a patient of mine. I last saw her in the office 10/04/2017. Her only cardiac risk factors include 25 pack years of tobacco abuse currently smoking one half pack per day. She has actually strong family history heart disease with both parents and multiple siblings with either died or had-related issues. She is retired Insurance claims handler. Since I saw her in the office over 2 years ago she has suffered an anterior wall myocardial infarction on 01/31/15 while at [facility]. She was taken to [facility] where she was found to have an occluded proximal LAD, occluded marginal branch and high-grade proximal RCA stenosis. She was cathed radially by Dr. Cecilio Asper. She had to synergy drug-eluting stents placed in an overlapping fashion in theproximal LAD and her proximal RCA. Her EF at that time was 25% which is felt clinically improved to 40-45% by 2-D echo 03/03/15 with an anteroapical wall motion abnormality.  She continues to smoke 1/2 pack/day.  Since I saw her in the office year ago she is remained stable.  She is sheltering in place and socially distancing.  She continues to smoke a half a pack a day.  She denies chest pain or shortness of breath.  Current Meds  Medication Sig  . acetaminophen (TYLENOL) 325 MG tablet Take 650 mg by mouth every 6 (six) hours as needed for mild pain.  Marland Kitchen albuterol (PROVENTIL HFA;VENTOLIN HFA) 108 (90 Base) MCG/ACT inhaler Inhale 1-2 puffs into the lungs every 6 (six) hours as needed for wheezing or shortness of breath. And or cough.  Marland Kitchen atorvastatin (LIPITOR) 80 MG tablet Take 1 tablet (80 mg total) by mouth daily. NEED OV.  . carvedilol (COREG) 3.125 MG  tablet TAKE 1 TABLET BY MOUTH TWICE DAILY  . clopidogrel (PLAVIX) 75 MG tablet TAKE 1 TABLET BY MOUTH ONCE DAILY  . [facility] ASPIRIN 81 MG EC tablet Take 81 mg by mouth daily.  Marland Kitchen lisinopril (ZESTRIL) 2.5 MG tablet Take 1 tablet (2.5 mg total) by mouth daily. NEED OV.     Allergies  Allergen Reactions  . Erythromycin     nauseated    Social History   Socioeconomic History  . Marital status: Married    Spouse name: Not on file  . Number of children: Not on file  . Years of education: Not on file  . Highest education level: Not on file  Occupational History  . Not on file  Social Needs  . Financial resource strain: Not on file  . Food insecurity    Worry: Not on file    Inability: Not on file  . Transportation needs    Medical: Not on file    Non-medical: Not on file  Tobacco Use  . Smoking status: Current Every Day Smoker    Packs/day: 0.50    Years: 50.00    Pack years: 25.00    Types: Cigarettes  . Smokeless tobacco: Never Used  Substance and Sexual Activity  . Alcohol use: No  . Drug use: No  . Sexual activity: Yes    Birth control/protection: Surgical  Lifestyle  . Physical activity    Days per week: Not on file  Minutes per session: Not on file  . Stress: Not on file  Relationships  . Social Herbalist on phone: Not on file    Gets together: Not on file    Attends religious service: Not on file    Active member of club or organization: Not on file    Attends meetings of clubs or organizations: Not on file    Relationship status: Not on file  . Intimate partner violence    Fear of current or ex partner: Not on file    Emotionally abused: Not on file    Physically abused: Not on file    Forced sexual activity: Not on file  Other Topics Concern  . Not on file  Social History Narrative  . Not on file     Review of Systems: General: negative for chills, fever, night sweats or weight changes.  Cardiovascular: negative for chest pain, dyspnea on  exertion, edema, orthopnea, palpitations, paroxysmal nocturnal dyspnea or shortness of breath Dermatological: negative for rash Respiratory: negative for cough or wheezing Urologic: negative for hematuria Abdominal: negative for nausea, vomiting, diarrhea, bright red blood per rectum, melena, or hematemesis Neurologic: negative for visual changes, syncope, or dizziness All other systems reviewed and are otherwise negative except as noted above.    Blood pressure (!) 192/89, pulse 62, height 5\' 2"  (1.575 m), weight 185 lb 9.6 oz (84.2 kg), SpO2 97 %.  General appearance: alert and no distress Neck: no adenopathy, no carotid bruit, no JVD, supple, symmetrical, trachea midline and thyroid not enlarged, symmetric, no tenderness/mass/nodules Lungs: clear to auscultation bilaterally Heart: regular rate and rhythm, S1, S2 normal, no murmur, click, rub or gallop Extremities: extremities normal, atraumatic, no cyanosis or edema Pulses: 2+ and symmetric Skin: Skin color, texture, turgor normal. No rashes or lesions Neurologic: Alert and oriented X 3, normal strength and tone. Normal symmetric reflexes. Normal coordination and gait  EKG sinus rhythm at 62 with septal Q waves and left axis deviation.  I personally reviewed this EKG.  ASSESSMENT AND PLAN:   Hyperlipidemia History of hyperlipidemia on high-dose statin therapy with lipid profile performed 01/17/2019 revealing total cholesterol 129, LDL 55 and HDL 41.  Coronary artery disease involving native heart History of CAD status post acute anterior wall myocardial infarction 01/31/2015 while at [facility]  She was taken to [facility] where she was found to have an occluded proximal LAD, occluded marginal branch and high-grade proximal RCA.  She was cath radially by Dr. Cecilio Asper .  She had a synergy drug-eluting stent placed in overlapping fashion in the proximal LAD and in her RCA.  Her EF at that time was 25% which  clinically improved later by 2D echo 03/03/2015 to 40 to 45% with an anteroapical wall motion abnormality.  She denies chest pain or shortness of breath.  Tobacco abuse History of continued tobacco abuse of 1/2 pack/day recalcitrant to risk factor modification.  Essential hypertension History of essential potential blood pressure measured today at 192/89.  She is on carvedilol and lisinopril.      Lorretta Harp MD FACP,FACC,FAHA, [facility] 01/22/2019 3:57 PM

## 2019-01-22 NOTE — Assessment & Plan Note (Signed)
History of continued tobacco abuse of 1/2 pack/day recalcitrant to risk factor modification.

## 2019-01-22 NOTE — Assessment & Plan Note (Signed)
History of essential potential blood pressure measured today at 192/89.  She is on carvedilol and lisinopril.

## 2019-01-22 NOTE — Assessment & Plan Note (Signed)
History of CAD status post acute anterior wall myocardial infarction 01/31/2015 while at [facility].  She was taken to [facility] where she was found to have an occluded proximal LAD, occluded marginal branch and high-grade proximal RCA.  She was cath radially by Dr. Cecilio Asper .  She had a synergy drug-eluting stent placed in overlapping fashion in the proximal LAD and in her RCA.  Her EF at that time was 25% which clinically improved later by 2D echo 03/03/2015 to 40 to 45% with an anteroapical wall motion abnormality.  She denies chest pain or shortness of breath.

## 2019-01-22 NOTE — Assessment & Plan Note (Signed)
History of hyperlipidemia on high-dose statin therapy with lipid profile performed 01/17/2019 revealing total cholesterol 129, LDL 55 and HDL 41.

## 2019-01-23 ENCOUNTER — Other Ambulatory Visit: Payer: Self-pay | Admitting: Cardiovascular Disease

## 2019-01-29 ENCOUNTER — Telehealth: Payer: Self-pay | Admitting: Cardiovascular Disease

## 2019-01-29 DIAGNOSIS — I10 Essential (primary) hypertension: Secondary | ICD-10-CM

## 2019-01-29 MED ORDER — LISINOPRIL 10 MG PO TABS: 10.0000 mg | ORAL_TABLET | Freq: Every day | ORAL | 1 refills | Status: DC

## 2019-01-29 NOTE — Telephone Encounter (Signed)
Please increase lisinopril to 10mg  daily, stay in cool areas, stay hydrated, limit sodium in diet and repeat BMET in 1 week.   Please schedule 1 months follow up with HTN clinic if patient agreeable.

## 2019-01-29 NOTE — Telephone Encounter (Signed)
New Message   Pt c/o BP issue:  1. What are your last 5 BP readings? 186/89, 190/85 2. Are you having any other symptoms (ex. Dizziness, headache, blurred vision, passed out)? headaches 3. What is your medication issue? Patient states even with the increase in her medication that her BP still elevated.

## 2019-01-29 NOTE — Telephone Encounter (Signed)
Patient was seen on 08/18- increase Lisinopril at that visit. It has been a week, and patient still has increased BP. Patient denies chest pain, SOB, or swelling Lightheaded at times, and having headaches   Patient does mention having pain in her groin area into her leg, she is going to PCP on Thursday, she knows that pain can increase BP and she wants to have her leg looked at as well, but patient wanted Dr.Berry to be aware in case he wanted to make any further adjustments.

## 2019-01-29 NOTE — Telephone Encounter (Signed)
Called patient, increased med, changed med list, ordered blood work. And made appointment with PharmD.

## 2019-01-30 ENCOUNTER — Telehealth: Payer: Self-pay

## 2019-01-30 NOTE — Telephone Encounter (Signed)

## 2019-01-31 ENCOUNTER — Ambulatory Visit (INDEPENDENT_AMBULATORY_CARE_PROVIDER_SITE_OTHER): Payer: Medicare Other | Admitting: Family Medicine

## 2019-01-31 ENCOUNTER — Encounter: Payer: Self-pay | Admitting: Family Medicine

## 2019-01-31 ENCOUNTER — Other Ambulatory Visit: Payer: Self-pay

## 2019-01-31 VITALS — BP 128/80 | HR 79 | Ht 62.0 in | Wt 183.4 lb

## 2019-01-31 DIAGNOSIS — Z1211 Encounter for screening for malignant neoplasm of colon: Secondary | ICD-10-CM

## 2019-01-31 DIAGNOSIS — Z720 Tobacco use: Secondary | ICD-10-CM | POA: Diagnosis not present

## 2019-01-31 DIAGNOSIS — E079 Disorder of thyroid, unspecified: Secondary | ICD-10-CM | POA: Diagnosis not present

## 2019-01-31 DIAGNOSIS — Z23 Encounter for immunization: Secondary | ICD-10-CM | POA: Diagnosis not present

## 2019-01-31 DIAGNOSIS — G8929 Other chronic pain: Secondary | ICD-10-CM | POA: Diagnosis not present

## 2019-01-31 DIAGNOSIS — R1032 Left lower quadrant pain: Secondary | ICD-10-CM

## 2019-01-31 LAB — T3, FREE: T3, Free: 3.2 pg/mL (ref 2.3–4.2)

## 2019-01-31 LAB — TSH: TSH: 1.75 u[IU]/mL (ref 0.35–4.50)

## 2019-01-31 NOTE — Progress Notes (Addendum)
Established Patient Office Visit  Subjective:  Patient ID: Terri [name], female    DOB: [birth year]  Age: 73 y.o. MRN: [id number]  CC:  Chief Complaint  Patient presents with  . Follow-up    HPI Terri [name] presents for follow-up follow-up of the left groin pain that she is been having.  Pain is acute and then promptly resolves.  Respond somewhat to Tylenol and Aleve.  X-rays of her back have shown some degenerative changes.  X-rays of the hip show preserved joint space.  She does have a history of back surgery.  Pain is now moving down her anterior right leg.  Continues to smoke.  This is a stress reliever.  Her husband is chronically ill and it helps her to relieve the stress associated with living with him and his illnesses.  She does not see the dentist regularly she has upper and lower plates.  She has an eye appointment scheduled soon.  Discussed the need for a colonoscopy.  Past Medical History:  Diagnosis Date  . Aortic insufficiency    a. Mild AI by echo 2014.  . Arthritis   . CAD (coronary artery disease)    a. Anterior STEMI 01/2015 s/p overlapping DES x2 to prox LAD, DES to prox RCA in [facility].  . Chronic back pain    stenosis/HNP  . History of bronchitis 2014  . History of colon polyps   . Hyperlipidemia   . Ischemic cardiomyopathy   . Joint pain   . Joint swelling   . Pneumonia 2014  . Smokers' cough ([place])   . Stress headaches   . Tobacco abuse     Past Surgical History:  Procedure Laterality Date  . ABDOMINAL HYSTERECTOMY     at age 89  . BREAST BIOPSY Right    roughly 30 years ago with [facility]   . cataract surgery Bilateral   . CHOLECYSTECTOMY    . colonscopy    . ERCP    . ESOPHAGOGASTRODUODENOSCOPY    . LUMBAR LAMINECTOMY Right 07/25/2014   Procedure: Right L4 Hemilaminectomy, Right L4-5 Laminotomy , Decompression, Microdisectomy;  Surgeon: Marybelle Killings, MD;  Location: [place];  Service: Orthopedics;  Laterality: Right;  . SHOULDER  SURGERY Right   . TONSILLECTOMY     as a child    Family History  Problem Relation Age of Onset  . Heart disease Mother   . Heart failure Mother   . Heart disease Father   . Heart failure Father   . CAD Other        both parents and multiple siblings with either died or had-related issues    Social History   Socioeconomic History  . Marital status: Married    Spouse name: Not on file  . Number of children: Not on file  . Years of education: Not on file  . Highest education level: Not on file  Occupational History  . Not on file  Social Needs  . Financial resource strain: Not on file  . Food insecurity    Worry: Not on file    Inability: Not on file  . Transportation needs    Medical: Not on file    Non-medical: Not on file  Tobacco Use  . Smoking status: Current Every Day Smoker    Packs/day: 0.50    Years: 50.00    Pack years: 25.00    Types: Cigarettes  . Smokeless tobacco: Never Used  Substance and Sexual Activity  .  Alcohol use: No  . Drug use: No  . Sexual activity: Yes    Birth control/protection: Surgical  Lifestyle  . Physical activity    Days per week: Not on file    Minutes per session: Not on file  . Stress: Not on file  Relationships  . Social Herbalist on phone: Not on file    Gets together: Not on file    Attends religious service: Not on file    Active member of club or organization: Not on file    Attends meetings of clubs or organizations: Not on file    Relationship status: Not on file  . Intimate partner violence    Fear of current or ex partner: Not on file    Emotionally abused: Not on file    Physically abused: Not on file    Forced sexual activity: Not on file  Other Topics Concern  . Not on file  Social History Narrative  . Not on file    Outpatient Medications Prior to Visit  Medication Sig Dispense Refill  . acetaminophen (TYLENOL) 325 MG tablet Take 650 mg by mouth every 6 (six) hours as needed for mild pain.     Marland Kitchen albuterol (PROVENTIL HFA;VENTOLIN HFA) 108 (90 Base) MCG/ACT inhaler Inhale 1-2 puffs into the lungs every 6 (six) hours as needed for wheezing or shortness of breath. And or cough. 1 Inhaler 0  . carvedilol (COREG) 3.125 MG tablet Take 1 tablet by mouth twice daily 180 tablet 3  . [facility] ASPIRIN 81 MG EC tablet Take 81 mg by mouth daily.  0  . lisinopril (ZESTRIL) 10 MG tablet Take 1 tablet (10 mg total) by mouth daily. 90 tablet 1  . atorvastatin (LIPITOR) 80 MG tablet Take 1 tablet (80 mg total) by mouth daily. NEED OV. 90 tablet 0  . clopidogrel (PLAVIX) 75 MG tablet TAKE 1 TABLET BY MOUTH ONCE DAILY 90 tablet 3   No facility-administered medications prior to visit.     Allergies  Allergen Reactions  . Erythromycin     nauseated    ROS Review of Systems  Constitutional: Negative.   HENT: Negative.   Respiratory: Negative.   Cardiovascular: Negative.   Gastrointestinal: Negative.   Endocrine: Negative for polyphagia and polyuria.  Genitourinary: Negative.   Musculoskeletal: Positive for arthralgias and myalgias.  Skin: Negative for pallor and rash.  Neurological: Negative for light-headedness and numbness.  Psychiatric/Behavioral: Negative.       Objective:    Physical Exam  Constitutional: She is oriented to person, place, and time. She appears well-developed and well-nourished. No distress.  HENT:  Head: Normocephalic and atraumatic.  Right Ear: External ear normal.  Left Ear: External ear normal.  Mouth/Throat: Oropharynx is clear and moist.  Eyes: Pupils are equal, round, and reactive to light. Conjunctivae are normal. Right eye exhibits no discharge. Left eye exhibits no discharge. No scleral icterus.  Neck: No JVD present. No tracheal deviation present. Thyromegaly present.  Cardiovascular: Normal rate, regular rhythm and normal heart sounds.  Pulmonary/Chest: Effort normal. No stridor. She has decreased breath sounds. She has no wheezes. She has no rhonchi.  She has no rales.  Musculoskeletal:        General: No edema.  Lymphadenopathy:    She has no cervical adenopathy.  Neurological: She is alert and oriented to person, place, and time.  Skin: Skin is warm and dry. She is not diaphoretic.  Psychiatric: She has a normal mood and  affect. Her behavior is normal.    BP 128/80   Pulse 79   Ht 5\' 2"  (1.575 m)   Wt 183 lb 6 oz (83.2 kg)   SpO2 97%   BMI 33.54 kg/m  Wt Readings from Last 3 Encounters:  02/18/19 184 lb (83.5 kg)  01/31/19 183 lb 6 oz (83.2 kg)  01/22/19 185 lb 9.6 oz (84.2 kg)   BP Readings from Last 3 Encounters:  02/18/19 140/64  01/31/19 128/80  01/22/19 (!) 160/74   Guideline developer:  UpToDate (see UpToDate for funding source) Date Released: June 2014  Health Maintenance Due  Topic Date Due  . Hepatitis C Screening  29-Nov-1945  . TETANUS/TDAP  10/07/1965  . COLONOSCOPY  10/07/1996  . PNA vac Low Risk Adult (2 of 2 - PCV13) 09/12/2018    There are no preventive care reminders to display for this patient.  Lab Results  Component Value Date   TSH 1.75 01/31/2019   Lab Results  Component Value Date   WBC 10.5 08/25/2017   HGB 13.1 08/25/2017   HCT 39.8 08/25/2017   MCV 88.4 08/25/2017   PLT 230 08/25/2017   Lab Results  Component Value Date   NA 139 02/07/2019   K 4.1 02/07/2019   CO2 19 (L) 02/07/2019   GLUCOSE 79 02/07/2019   BUN 18 02/07/2019   CREATININE 0.83 02/07/2019   BILITOT 0.3 01/17/2019   ALKPHOS 135 (H) 01/17/2019   AST 11 01/17/2019   ALT 15 01/17/2019   PROT 6.1 01/17/2019   ALBUMIN 4.3 01/17/2019   CALCIUM 8.8 02/07/2019   ANIONGAP 9 08/25/2017   GFR 67.25 03/07/2018   Lab Results  Component Value Date   CHOL 129 01/17/2019   Lab Results  Component Value Date   HDL 41 01/17/2019   Lab Results  Component Value Date   LDLCALC 55 01/17/2019   Lab Results  Component Value Date   TRIG 163 (H) 01/17/2019   Lab Results  Component Value Date   CHOLHDL 3.3  09/14/2016   Lab Results  Component Value Date   HGBA1C 6.0 02/27/2015      Assessment & Plan:   Problem List Items Addressed This Visit      Other   Tobacco use   Screen for colon cancer   Relevant Orders   Ambulatory referral to Gastroenterology   Groin pain, chronic, left   Relevant Orders   Ambulatory referral to Sports Medicine   Thyroid mass   Relevant Orders   TSH (Completed)   T3, free (Completed)   US THYROID (Completed)   US THYROID   Need for influenza vaccination - Primary   Relevant Orders   Flu Vaccine QUAD High Dose(Fluad) (Completed)      No orders of the defined types were placed in this encounter.   Follow-up: Return in about 3 months (around 05/03/2019).    Discussed the need for a colonoscopy.  Advised her again to quit smoking and gave her information on steps to quit.  Left thyroid mass was palpated today.  She agrees to go for an ultrasound.  Sports medicine consultation for left groin and anterior left thigh pain.  Follow-up in 3 months.

## 2019-01-31 NOTE — Patient Instructions (Signed)

## 2019-02-07 ENCOUNTER — Other Ambulatory Visit: Payer: Self-pay

## 2019-02-07 DIAGNOSIS — I10 Essential (primary) hypertension: Secondary | ICD-10-CM | POA: Diagnosis not present

## 2019-02-08 LAB — BASIC METABOLIC PANEL
BUN/Creatinine Ratio: 22 (ref 12–28)
BUN: 18 mg/dL (ref 8–27)
CO2: 19 mmol/L — ABNORMAL LOW (ref 20–29)
Calcium: 8.8 mg/dL (ref 8.7–10.3)
Chloride: 104 mmol/L (ref 96–106)
Creatinine, Ser: 0.83 mg/dL (ref 0.57–1.00)
GFR calc Af Amer: 81 mL/min/{1.73_m2} (ref >59)
GFR calc non Af Amer: 71 mL/min/{1.73_m2} (ref >59)
Glucose: 79 mg/dL (ref 65–99)
Potassium: 4.1 mmol/L (ref 3.5–5.2)
Sodium: 139 mmol/L (ref 134–144)

## 2019-02-17 ENCOUNTER — Other Ambulatory Visit: Payer: Self-pay | Admitting: Cardiovascular Disease

## 2019-02-18 ENCOUNTER — Encounter: Payer: Self-pay | Admitting: Family Medicine

## 2019-02-18 ENCOUNTER — Ambulatory Visit (INDEPENDENT_AMBULATORY_CARE_PROVIDER_SITE_OTHER): Payer: Medicare Other | Admitting: Family Medicine

## 2019-02-18 ENCOUNTER — Other Ambulatory Visit: Payer: Self-pay

## 2019-02-18 DIAGNOSIS — R1032 Left lower quadrant pain: Secondary | ICD-10-CM | POA: Diagnosis not present

## 2019-02-18 DIAGNOSIS — I2510 Atherosclerotic heart disease of native coronary artery without angina pectoris: Secondary | ICD-10-CM

## 2019-02-18 DIAGNOSIS — G8929 Other chronic pain: Secondary | ICD-10-CM

## 2019-02-18 NOTE — Patient Instructions (Signed)
Good to see you.  Ice 20 minutes 2 times daily. Usually after activity and before bed. Exercises 3 times a week.  pennsaid pinkie amount topically 2 times daily as needed. Wear good shoes Tart cherry extract 1200mg  at night Vitamin D 2000 IU daily  See me again in 4-6 weeks

## 2019-02-18 NOTE — Progress Notes (Signed)
[facility] [address], [place] [address] Phone: [phone] Subjective:   I [name] am serving as a Education administrator for Dr. Hulan Saas.  I'm seeing this patient by the request  of:   Libby Maw, MD   CC: Left groin pain  [id number]  Terri [name] is a 73 y.o. female coming in with complaint of left groin pain. Has been better the past 2 days. Sitting feels better. Tingling down to her toes.   Onset- Chronic Location - left   Character- achy  Aggravating factors- walking Reliving factors-  Therapies tried- topical  Severity- 7/10 at its worse. Right now 3/10    Patient's previous x-rays of the hip in 2019 showing mild degenerative changes in the SI joint as well as mild of the left hip lumbar back x-rays also showed mild facet degenerative changes and mild degenerative disc disease knees were independently visualized by me  Past Medical History:  Diagnosis Date  . Aortic insufficiency    a. Mild AI by echo 2014.  . Arthritis   . CAD (coronary artery disease)    a. Anterior STEMI 01/2015 s/p overlapping DES x2 to prox LAD, DES to prox RCA in [facility].  . Chronic back pain    stenosis/HNP  . History of bronchitis 2014  . History of colon polyps   . Hyperlipidemia   . Ischemic cardiomyopathy   . Joint pain   . Joint swelling   . Pneumonia 2014  . Smokers' cough ([place])   . Stress headaches   . Tobacco abuse    Past Surgical History:  Procedure Laterality Date  . ABDOMINAL HYSTERECTOMY     at age 37  . BREAST BIOPSY Right    roughly 30 years ago with [facility]   . cataract surgery Bilateral   . CHOLECYSTECTOMY    . colonscopy    . ERCP    . ESOPHAGOGASTRODUODENOSCOPY    . LUMBAR LAMINECTOMY Right 07/25/2014   Procedure: Right L4 Hemilaminectomy, Right L4-5 Laminotomy , Decompression, Microdisectomy;  Surgeon: Marybelle Killings, MD;  Location: [place];  Service: Orthopedics;  Laterality: Right;  . SHOULDER  SURGERY Right   . TONSILLECTOMY     as a child   Social History   Socioeconomic History  . Marital status: Married    Spouse name: Not on file  . Number of children: Not on file  . Years of education: Not on file  . Highest education level: Not on file  Occupational History  . Not on file  Social Needs  . Financial resource strain: Not on file  . Food insecurity    Worry: Not on file    Inability: Not on file  . Transportation needs    Medical: Not on file    Non-medical: Not on file  Tobacco Use  . Smoking status: Current Every Day Smoker    Packs/day: 0.50    Years: 50.00    Pack years: 25.00    Types: Cigarettes  . Smokeless tobacco: Never Used  Substance and Sexual Activity  . Alcohol use: No  . Drug use: No  . Sexual activity: Yes    Birth control/protection: Surgical  Lifestyle  . Physical activity    Days per week: Not on file    Minutes per session: Not on file  . Stress: Not on file  Relationships  . Social connections    Talks on phone: Not on file  Gets together: Not on file    Attends religious service: Not on file    Active member of club or organization: Not on file    Attends meetings of clubs or organizations: Not on file    Relationship status: Not on file  Other Topics Concern  . Not on file  Social History Narrative  . Not on file   Allergies  Allergen Reactions  . Erythromycin     nauseated   Family History  Problem Relation Age of Onset  . Heart disease Mother   . Heart failure Mother   . Heart disease Father   . Heart failure Father   . CAD Other        both parents and multiple siblings with either died or had-related issues     Current Outpatient Medications (Cardiovascular):  .  atorvastatin (LIPITOR) 80 MG tablet, Take 1 tablet by mouth once daily .  carvedilol (COREG) 3.125 MG tablet, Take 1 tablet by mouth twice daily .  lisinopril (ZESTRIL) 10 MG tablet, Take 1 tablet (10 mg total) by mouth daily.  Current  Outpatient Medications (Respiratory):  .  albuterol (PROVENTIL HFA;VENTOLIN HFA) 108 (90 Base) MCG/ACT inhaler, Inhale 1-2 puffs into the lungs every 6 (six) hours as needed for wheezing or shortness of breath. And or cough.  Current Outpatient Medications (Analgesics):  .  acetaminophen (TYLENOL) 325 MG tablet, Take 650 mg by mouth every 6 (six) hours as needed for mild pain. .  [employer] ASPIRIN 81 MG EC tablet, Take 81 mg by mouth daily.  Current Outpatient Medications (Hematological):  .  clopidogrel (PLAVIX) 75 MG tablet, Take 1 tablet by mouth once daily     Past medical history, social, surgical and family history all reviewed in electronic medical record.  No pertanent information unless stated regarding to the chief complaint.   Review of Systems:  No headache, visual changes, nausea, vomiting, diarrhea, constipation, dizziness, abdominal pain, skin rash, fevers, chills, night sweats, weight loss, swollen lymph nodes, body aches, joint swelling, muscle aches, chest pain, shortness of breath, mood changes.   Objective  Blood pressure 140/64, pulse 67, height 5\' 2"  (1.575 m), weight 184 lb (83.5 kg), SpO2 97 %.    General: No apparent distress alert and oriented x3 mood and affect normal, dressed appropriately.  HEENT: Pupils equal, extraocular movements intact  Respiratory: Patient's speak in full sentences and does not appear short of breath  Cardiovascular: No lower extremity edema, non tender, no erythema  Skin: Warm dry intact with no signs of infection or rash on extremities or on axial skeleton.  Abdomen: Soft nontender  Neuro: Cranial nerves II through XII are intact, neurovascularly intact in all extremities with 2+ DTRs and 2+ pulses.  Lymph: No lymphadenopathy of posterior or anterior cervical chain or axillae bilaterally.  Gait antalgic MSK:  tender with mild limited range of motion and good stability and symmetric strength and tone of shoulders, elbows, wrist, knee and  ankles bilaterally.  Mild arthritic changes of multiple joints  Left hip exam has good range of motion.  More pain in the groin area with Corky Sox than internal range of motion.  Negative straight leg test.  Pain with resisted flexion of the hip noted.  No palpable mass noted in the groin area.  Nontender over the greater trochanteric area or the SI joint.  Minimal discomfort in the paraspinal musculature of the lumbar spine  [id number]; 15 additional minutes spent for Therapeutic exercises as stated in above notes.  This included exercises focusing on stretching, strengthening, with significant focus on eccentric aspects.   Long term goals include an improvement in range of motion, strength, endurance as well as avoiding reinjury. Patient's frequency would include in 1-2 times a day, 3-5 times a week for a duration of 6-12 weeks. Hip strengthening exercises which included:  Pelvic tilt/bracing to help with proper recruitment of the lower abs and pelvic floor muscles  Glute strengthening to properly contract glutes without over-engaging low back and hamstrings - prone hip extension and glute bridge exercises Proper stretching techniques to increase effectiveness for the hip flexors, groin, quads, piriformic and low back when appropriate    Proper technique shown and discussed handout in great detail with ATC.  All questions were discussed and answered.      Impression and Recommendations:     This case required medical decision making of moderate complexity. The above documentation has been reviewed and is accurate and complete Terri Pulley, DO       Note: This dictation was prepared with Dragon dictation along with smaller phrase technology. Any transcriptional errors that result from this process are unintentional.

## 2019-02-18 NOTE — Assessment & Plan Note (Signed)
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  We discussed with patient at great length that there is some osteoarthritic changes of the back but everything seems to be mild as well as patient's hip.  Patient has full internal range of motion of the hip but I do not think intra-articular pathology is likely at this time.  Differential does include labral pathology but the range of motion seems to be doing relatively well.  Discussed with patient to watch for any signs of hernia but instead I believe it is more secondary to a hip flexor tendinitis patient is encouraged to do home exercises including stretching, hip abductor strengthening, icing regimen.  Patient is on a blood thinner and we discussed topical anti-inflammatories instead of oral.  Discussed compression sleeve potentially to be helpful.  Patient will see me again in 4 to 6 weeks continue to have pain consider formal physical therapy before advanced imaging

## 2019-02-19 ENCOUNTER — Ambulatory Visit (HOSPITAL_BASED_OUTPATIENT_CLINIC_OR_DEPARTMENT_OTHER)
Admission: RE | Admit: 2019-02-19 | Discharge: 2019-02-19 | Disposition: A | Discharge status: Home / Self Care | Payer: Medicare Other | Source: Ambulatory Visit | Attending: Family Medicine | Admitting: Family Medicine

## 2019-02-19 DIAGNOSIS — E079 Disorder of thyroid, unspecified: Secondary | ICD-10-CM

## 2019-02-19 DIAGNOSIS — E041 Nontoxic single thyroid nodule: Secondary | ICD-10-CM | POA: Diagnosis not present

## 2019-02-19 NOTE — Addendum Note (Signed)
Addended by: Jon Billings on: 02/19/2019 04:03 PM   Modules accepted: Orders

## 2019-03-12 ENCOUNTER — Encounter: Payer: Self-pay | Admitting: Pharmacist Clinician (PhC)/ Clinical Pharmacy Specialist

## 2019-03-12 ENCOUNTER — Ambulatory Visit (INDEPENDENT_AMBULATORY_CARE_PROVIDER_SITE_OTHER): Payer: Medicare Other | Admitting: Pharmacist Clinician (PhC)/ Clinical Pharmacy Specialist

## 2019-03-12 ENCOUNTER — Other Ambulatory Visit: Payer: Self-pay

## 2019-03-12 DIAGNOSIS — I10 Essential (primary) hypertension: Secondary | ICD-10-CM

## 2019-03-12 DIAGNOSIS — I2510 Atherosclerotic heart disease of native coronary artery without angina pectoris: Secondary | ICD-10-CM | POA: Diagnosis not present

## 2019-03-12 MED ORDER — LISINOPRIL 20 MG PO TABS: 20.0000 mg | ORAL_TABLET | Freq: Every day | ORAL | 3 refills | Status: DC

## 2019-03-12 NOTE — Progress Notes (Signed)
03/12/2019 ERI FLOW [birth year] [id number]   HPI:  Terri [name] is a 73 y.o. female patient of Dr Gwenlyn Found, with a PMH below who presents today for hypertension clinic evaluation.  See pertinent medical history below.  Patient saw Dr. Gwenlyn Found on 8-18 and was found to have a BP of 160/74, measured first at 192/89.  Lisinopril was increased from 2.5 to 5 mg daily.  She called into the office a week later to report BP still elevated and dose was increased to 10 mg daily.  She is here today in the office for follow up.  Patient notes that she has not had high blood pressure in the past, and notes that she has no dizziness or lightheadedness, no chest pain or shortness of breath.   She is not interested in smoking cessation today.  She knows the dangers of tobacco, however states that she cares for her husband, who has PTSD, and has a stressful life.  .   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), DES to proximal LAD and RCA  PVD Seen on CT scan 03/2018 after left leg/groin swelling  hyperlipidemia 01/2019 - TC 129, TG 163, HDL 41, LDL 55  Ischemic cardiomyopathy Echo at time of MI showed EF 25%, clinically improved, most recent at 40-45% (02/2015)  Tobacco use 25 yr pack history, currently 1/2 ppd     Blood Pressure Goal:  130/80  Current Medications: carvedilol 3.125 mg bid, lisinopril 10 mg qd  Family Hx: both parents with heart issues 1 of 12 children,  Currently only 3 living, all others died from strokes or heart disease 2 children, neither with cardiac issues  Social Hx: 1/2 ppd, no alcohol, coffee 4 cups per day, home brewed; diet coke 2 16 oz bottle per day  Diet: home cooked; lots of chicken, also pork chops, occasional steaks; fish 2-3 times per month; mix of canned/fresh/frozen veggies, not daily; snacks on popcorn - buttered/salted Raised with low salt, only uses some when cooking (or eating popcorn)   Exercise: no regular exercise  Home BP readings: since  increase to 10 mg has not had anything over 170's, lowest 0000000 systolic  Home cuff, given to husband from [place] 34-33 years old.  Did not bring cuff or home readings  Intolerances: erythromycin  Labs:  02/2019:  Na 139, K 4.1, Glu 79, BUN 18, SCr 0.83  Wt Readings from Last 3 Encounters:  03/12/19 186 lb (84.4 kg)  02/18/19 184 lb (83.5 kg)  01/31/19 183 lb 6 oz (83.2 kg)   BP Readings from Last 3 Encounters:  03/12/19 (!) 178/92  02/18/19 140/64  01/31/19 128/80   Pulse Readings from Last 3 Encounters:  03/12/19 68  02/18/19 67  01/31/19 79    Current Outpatient Medications  Medication Sig Dispense Refill  . albuterol (PROVENTIL HFA;VENTOLIN HFA) 108 (90 Base) MCG/ACT inhaler Inhale 1-2 puffs into the lungs every 6 (six) hours as needed for wheezing or shortness of breath. And or cough. 1 Inhaler 0  . atorvastatin (LIPITOR) 80 MG tablet Take 1 tablet by mouth once daily 90 tablet 0  . carvedilol (COREG) 3.125 MG tablet Take 1 tablet by mouth twice daily 180 tablet 3  . clopidogrel (PLAVIX) 75 MG tablet Take 1 tablet by mouth once daily 90 tablet 0  . CVS ASPIRIN 81 MG EC tablet Take 81 mg by mouth daily.  0  . lisinopril (ZESTRIL) 20 MG tablet Take 1 tablet (20 mg total)  by mouth daily. 90 tablet 3   No current facility-administered medications for this visit.     Allergies  Allergen Reactions  . Erythromycin     nauseated    Past Medical History:  Diagnosis Date  . Aortic insufficiency    a. Mild AI by echo 2014.  . Arthritis   . CAD (coronary artery disease)    a. Anterior STEMI 01/2015 s/p overlapping DES x2 to prox LAD, DES to prox RCA in [facility].  . Chronic back pain    stenosis/HNP  . History of bronchitis 2014  . History of colon polyps   . Hyperlipidemia   . Ischemic cardiomyopathy   . Joint pain   . Joint swelling   . Pneumonia 2014  . Smokers' cough ([place])   . Stress headaches   . Tobacco abuse     Blood pressure (!) 178/92, pulse 68,  height 5' 2.5" (1.588 m), weight 186 lb (84.4 kg).  Essential hypertension Patient with poorly controlled hypertension.  Will increase the lisinopril to 20 mg daily and have patient check home BP 1-2 times daily for next month.  Repeat BMET at next appointment, as labs were stable when dose increased to 10 mg daily.  Will probably need to add another medication to her profile, could increase carvedilol, but suspect will get better control by adding chlorthalidone or amlodipine.     Tommy Medal PharmD CPP [facility] [address] [place], [place] [address] [phone]

## 2019-03-12 NOTE — Assessment & Plan Note (Signed)
Patient with poorly controlled hypertension.  Will increase the lisinopril to 20 mg daily and have patient check home BP 1-2 times daily for next month.  Repeat BMET at next appointment, as labs were stable when dose increased to 10 mg daily.  Will probably need to add another medication to her profile, could increase carvedilol, but suspect will get better control by adding chlorthalidone or amlodipine.

## 2019-03-12 NOTE — Patient Instructions (Addendum)
Return for a a follow up appointment in 1 month  Your blood pressure today is 178/92  Check your blood pressure at home once or twice and keep record of the readings.  Take your BP meds as follows:  Increase lisinopril to 20 mg once daily  Continue with all other medications  Bring all of your meds, your BP cuff and your record of home blood pressures to your next appointment.  Exercise as you're able, try to walk approximately 30 minutes per day.  Keep salt intake to a minimum, especially watch canned and prepared boxed foods.  Eat more fresh fruits and vegetables and fewer canned items.  Avoid eating in fast food restaurants.    HOW TO TAKE YOUR BLOOD PRESSURE: . Rest 5 minutes before taking your blood pressure. .  Don't smoke or drink caffeinated beverages for at least 30 minutes before. . Take your blood pressure before (not after) you eat. . Sit comfortably with your back supported and both feet on the floor (don't cross your legs). . Elevate your arm to heart level on a table or a desk. . Use the proper sized cuff. It should fit smoothly and snugly around your bare upper arm. There should be enough room to slip a fingertip under the cuff. The bottom edge of the cuff should be 1 inch above the crease of the elbow. . Ideally, take 3 measurements at one sitting and record the average.

## 2019-03-16 DIAGNOSIS — R21 Rash and other nonspecific skin eruption: Secondary | ICD-10-CM | POA: Diagnosis not present

## 2019-03-16 DIAGNOSIS — B029 Zoster without complications: Secondary | ICD-10-CM | POA: Diagnosis not present

## 2019-03-29 ENCOUNTER — Ambulatory Visit: Payer: Medicare Other | Admitting: Family Medicine

## 2019-04-09 ENCOUNTER — Encounter: Payer: Self-pay | Admitting: Family Medicine

## 2019-04-11 ENCOUNTER — Ambulatory Visit (INDEPENDENT_AMBULATORY_CARE_PROVIDER_SITE_OTHER): Payer: Medicare Other | Admitting: Pharmacist Clinician (PhC)/ Clinical Pharmacy Specialist

## 2019-04-11 ENCOUNTER — Other Ambulatory Visit: Payer: Self-pay

## 2019-04-11 DIAGNOSIS — I10 Essential (primary) hypertension: Secondary | ICD-10-CM | POA: Diagnosis not present

## 2019-04-11 DIAGNOSIS — I2510 Atherosclerotic heart disease of native coronary artery without angina pectoris: Secondary | ICD-10-CM | POA: Diagnosis not present

## 2019-04-11 NOTE — Patient Instructions (Signed)
Return for a a follow up appointment on Nov 23  Check your blood pressure at home daily (if able) and keep record of the readings.  Take your BP meds as follows:  Start chlorthalidone 12.5 mg once daily in the mornings (1/2 tablet) after 1-2 weeks increase to 25 mg (1 tablet) daily  You can use Tylenol PM for sleep aid if you need to.   Bring all of your meds, your BP cuff and your record of home blood pressures to your next appointment.  Exercise as you're able, try to walk approximately 30 minutes per day.  Keep salt intake to a minimum, especially watch canned and prepared boxed foods.  Eat more fresh fruits and vegetables and fewer canned items.  Avoid eating in fast food restaurants.    HOW TO TAKE YOUR BLOOD PRESSURE: . Rest 5 minutes before taking your blood pressure. .  Don't smoke or drink caffeinated beverages for at least 30 minutes before. . Take your blood pressure before (not after) you eat. . Sit comfortably with your back supported and both feet on the floor (don't cross your legs). . Elevate your arm to heart level on a table or a desk. . Use the proper sized cuff. It should fit smoothly and snugly around your bare upper arm. There should be enough room to slip a fingertip under the cuff. The bottom edge of the cuff should be 1 inch above the crease of the elbow. . Ideally, take 3 measurements at one sitting and record the average.

## 2019-04-11 NOTE — Progress Notes (Signed)
04/12/2019 Terri [name] Dec 17, 1945 [id number]   HPI:  Terri [name] is a 73 y.o. female patient of Dr Gwenlyn Found, with a PMH below who presents today for hypertension clinic evaluation.  See pertinent medical history below.  Patient saw Dr. Gwenlyn Found on 8-18 and was found to have a BP of 160/74, measured first at 192/89.  Over the past 2 months we have increased the lisinopril from 2.5 to 20 mg daily.  At her last visit she was feeling well, other than stresses in life (husband has PTSD).     At her last visit with me, the lisinopril was increased to 20 mg daily.  She returns today for follow up, noting that her home blood pressure readings have not improved much over the past 3-4 week.s  She admits to occasional headaches, which she attributes to the higher BP readings.   .   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), DES to proximal LAD and RCA  PVD Seen on CT scan 03/2018 after left leg/groin swelling  hyperlipidemia 01/2019 - TC 129, TG 163, HDL 41, LDL 55  Ischemic cardiomyopathy Echo at time of MI showed EF 25%, clinically improved, most recent at 40-45% (02/2015)  Tobacco use 25 yr pack history, currently 1/2 ppd     Blood Pressure Goal:  130/80  Current Medications: carvedilol 3.125 mg bid, lisinopril 20 mg qd  Family Hx: both parents with heart issues 1 of 12 children,  Currently only 3 living, all others died from strokes or heart disease 2 children, neither with cardiac issues  Social Hx: 1/2 ppd, no alcohol, coffee 4 cups per day, home brewed; diet coke 2 16 oz bottle per day  Diet: home cooked; lots of chicken, also pork chops, occasional steaks; fish 2-3 times per month; mix of canned/fresh/frozen veggies, not daily; snacks on popcorn - buttered/salted Raised with low salt, only uses some when cooking (or eating popcorn)   Exercise: no regular exercise  Home BP readings: home cuff measured within 10 points of office cuff today.  Last 2 weeks, home readings AM  average 160/78, PM average 169/80.    Intolerances: erythromycin  Labs:  02/2019:  Na 139, K 4.1, Glu 79, BUN 18, SCr 0.83  Wt Readings from Last 3 Encounters:  04/11/19 185 lb 9.6 oz (84.2 kg)  03/12/19 186 lb (84.4 kg)  02/18/19 184 lb (83.5 kg)   BP Readings from Last 3 Encounters:  04/11/19 (!) 162/70  03/12/19 (!) 178/92  02/18/19 140/64   Pulse Readings from Last 3 Encounters:  04/11/19 69  03/12/19 68  02/18/19 67    Current Outpatient Medications  Medication Sig Dispense Refill  . albuterol (PROVENTIL HFA;VENTOLIN HFA) 108 (90 Base) MCG/ACT inhaler Inhale 1-2 puffs into the lungs every 6 (six) hours as needed for wheezing or shortness of breath. And or cough. 1 Inhaler 0  . atorvastatin (LIPITOR) 80 MG tablet Take 1 tablet by mouth once daily 90 tablet 0  . carvedilol (COREG) 3.125 MG tablet Take 1 tablet by mouth twice daily 180 tablet 3  . clopidogrel (PLAVIX) 75 MG tablet Take 1 tablet by mouth once daily 90 tablet 0  . CVS ASPIRIN 81 MG EC tablet Take 81 mg by mouth daily.  0  . lisinopril (ZESTRIL) 20 MG tablet Take 1 tablet (20 mg total) by mouth daily. 90 tablet 3   No current facility-administered medications for this visit.     Allergies  Allergen Reactions  .  Erythromycin     nauseated    Past Medical History:  Diagnosis Date  . Aortic insufficiency    a. Mild AI by echo 2014.  . Arthritis   . CAD (coronary artery disease)    a. Anterior STEMI 01/2015 s/p overlapping DES x2 to prox LAD, DES to prox RCA in [facility].  . Chronic back pain    stenosis/HNP  . History of bronchitis 2014  . History of colon polyps   . Hyperlipidemia   . Ischemic cardiomyopathy   . Joint pain   . Joint swelling   . Pneumonia 2014  . Smokers' cough ([place])   . Stress headaches   . Tobacco abuse     Blood pressure (!) 162/70, pulse 69, resp. rate 16, height 5' 2.5" (1.588 m), weight 185 lb 9.6 oz (84.2 kg), SpO2 97 %.  Essential hypertension:  Patient with essential hypertension, still not controlled despite increase in lisinopril. Will add chlorthalidone 12.5 mg daily for 1-2 weeks then 1 tablet daily.  She will return in 3 weeks for follow up and repeat metabolic panel.  She is to continue with regular home BP checks and can call should she have any problems in the meantime.     Tommy Medal PharmD CPP [facility] [address] [place], [place] [address] [phone]

## 2019-04-11 NOTE — Assessment & Plan Note (Addendum)
Patient with essential hypertension, still not controlled despite increase in lisinopril. Will add chlorthalidone 12.5 mg daily for 1-2 weeks then 1 tablet daily.  She will return in 3 weeks for follow up and repeat metabolic panel.  She is to continue with regular home BP checks and can call should she have any problems in the meantime.

## 2019-04-15 ENCOUNTER — Telehealth: Payer: Self-pay | Admitting: Pharmacist Clinician (PhC)/ Clinical Pharmacy Specialist

## 2019-04-15 MED ORDER — CHLORTHALIDONE 25 MG PO TABS: ORAL_TABLET | ORAL | 0 refills | Status: DC

## 2019-04-15 NOTE — Telephone Encounter (Signed)
New message   Pt c/o medication issue:  1. Name of Medication: Chlorthalidone 12.5 mg   2. How are you currently taking this medication (dosage and times per day)?one time daily  3. Are you having a reaction (difficulty breathing--STAT)? n/a  4. What is your medication issue? Patient states that this is a new prescription from the pharmacist and it has not been sent over to [facility], [place]

## 2019-04-15 NOTE — Telephone Encounter (Signed)
Patient states that Terri [name] has still not received the prescription for her medication Chlorthalidone 12.5mg .

## 2019-04-15 NOTE — Telephone Encounter (Signed)
Rx for chlorthalidone 25 mg sent to correct pharmacy [facility] on [address] in [place], [place].   Was prescribed to pt by Erasmo Downer, PharmD to take 1/2 tab every morning for 1-2 weeks; then increase to 1 tab daily.

## 2019-04-28 DIAGNOSIS — B029 Zoster without complications: Secondary | ICD-10-CM | POA: Diagnosis not present

## 2019-04-29 ENCOUNTER — Ambulatory Visit (INDEPENDENT_AMBULATORY_CARE_PROVIDER_SITE_OTHER): Payer: Medicare Other | Admitting: Pharmacist Clinician (PhC)/ Clinical Pharmacy Specialist

## 2019-04-29 ENCOUNTER — Other Ambulatory Visit: Payer: Self-pay

## 2019-04-29 DIAGNOSIS — I10 Essential (primary) hypertension: Secondary | ICD-10-CM

## 2019-04-29 DIAGNOSIS — I2510 Atherosclerotic heart disease of native coronary artery without angina pectoris: Secondary | ICD-10-CM

## 2019-04-29 MED ORDER — CHLORTHALIDONE 25 MG PO TABS: 12.5000 mg | ORAL_TABLET | Freq: Every day | ORAL | 3 refills | Status: DC

## 2019-04-29 NOTE — Patient Instructions (Signed)
Call if you have any further concerns about your blood pressure. Audri Kozub/Raquel at [phone]  Your blood pressure today is 130/70  Check your blood pressure at home most days and keep record of the readings.  Take your BP meds as follows:  Continue with all your current medications.  If your blood pressure goes low and you are feeling poorly, please cut your lisinopril in half for 3-4 days (10 mg instead of 20 mg).   Bring all of your meds, your BP cuff and your record of home blood pressures to your next appointment.  Exercise as you're able, try to walk approximately 30 minutes per day.  Keep salt intake to a minimum, especially watch canned and prepared boxed foods.  Eat more fresh fruits and vegetables and fewer canned items.  Avoid eating in fast food restaurants.    HOW TO TAKE YOUR BLOOD PRESSURE: . Rest 5 minutes before taking your blood pressure. .  Don't smoke or drink caffeinated beverages for at least 30 minutes before. . Take your blood pressure before (not after) you eat. . Sit comfortably with your back supported and both feet on the floor (don't cross your legs). . Elevate your arm to heart level on a table or a desk. . Use the proper sized cuff. It should fit smoothly and snugly around your bare upper arm. There should be enough room to slip a fingertip under the cuff. The bottom edge of the cuff should be 1 inch above the crease of the elbow. . Ideally, take 3 measurements at one sitting and record the average.  \

## 2019-04-29 NOTE — Assessment & Plan Note (Signed)
Patient with essential hypertension, well controlled when she takes medications.  She actually had an excellent response with the addition of 12.5 mg chlorthalidone daily, and did not need to go to a full 25 mg dose.  However she stopped for several days and her pressure, accordingly increased.  She was advised today to take meds daily, and if she felt her pressure was too low, to cut her lisinopril dose in half for 2-3 days, then resume at the full 20 mg dose.  Patient voiced understanding that the symptoms were related to her body adjusting to the new normal readings and would probably abate within a few days.  She can call with any further questions or concerns.

## 2019-04-29 NOTE — Progress Notes (Signed)
04/29/2019 LAURNA PLAZA 03/20/1946 [id number]   HPI:  Terri [name] is a 73 y.o. female patient of Dr Gwenlyn Found, with a PMH below who presents today for hypertension clinic evaluation.  See pertinent medical history below.  Patient saw Dr. Gwenlyn Found on 8-18 and was found to have a BP of 160/74, measured first at 192/89.  Over the past 2 months we have increased the lisinopril from 2.5 to 20 mg daily.  At her last visit she was feeling well, other than stresses in life (husband has PTSD).   At her last visit we added chlorthalidone 12.5 mg x one week then increase to 25 mg qd.    Today patient comes in for follow up with no complaints.  She does note that after a week on the 1/2 tablet of chlorthalidone, her BP dropped significantly and she was feeling washed out and dizzy.  She stopped all her medications for 4 days, then resumed all, just 3 days ago.  Her home BP shows a pattern of very good readings until the past 3 days when they became elevated again.      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), DES to proximal LAD and RCA  PVD Seen on CT scan 03/2018 after left leg/groin swelling  hyperlipidemia 01/2019 - TC 129, TG 163, HDL 41, LDL 55  Ischemic cardiomyopathy Echo at time of MI showed EF 25%, clinically improved, most recent at 40-45% (02/2015)  Tobacco use 25 yr pack history, currently 1/2 ppd     Blood Pressure Goal:  130/80  Current Medications: carvedilol 3.125 mg bid, lisinopril 20 mg qd, chlorthalidone 12.5 qd  Family Hx: both parents with heart issues 1 of 12 children,  Currently only 3 living, all others died from strokes or heart disease 2 children, neither with cardiac issues  Social Hx: 1/2 ppd, no alcohol, coffee 4 cups per day, home brewed; diet coke 2 16 oz bottle per day  Diet: home cooked; lots of chicken, also pork chops, occasional steaks; fish 2-3 times per month; mix of canned/fresh/frozen veggies, not daily; snacks on popcorn - buttered/salted Raised  with low salt, only uses some when cooking (or eating popcorn)   Exercise: no regular exercise  Home BP readings: average of all 14 morning readings was 142/76, down 18/2 from previous visit.  When we look at readings from 3 days after starting chlorthalidone until 3 days after stopping (7 days total), her average was 125/73.  Pressure increased on day 3-4 after stopping all meds, back to the [id number] systolic.    Intolerances: erythromycin  Labs:  02/2019:  Na 139, K 4.1, Glu 79, BUN 18, SCr 0.83  Wt Readings from Last 3 Encounters:  04/29/19 183 lb 6.4 oz (83.2 kg)  04/11/19 185 lb 9.6 oz (84.2 kg)  03/12/19 186 lb (84.4 kg)   BP Readings from Last 3 Encounters:  04/11/19 (!) 162/70  03/12/19 (!) 178/92  02/18/19 140/64   Pulse Readings from Last 3 Encounters:  04/11/19 69  03/12/19 68  02/18/19 67    Current Outpatient Medications  Medication Sig Dispense Refill  . albuterol (PROVENTIL HFA;VENTOLIN HFA) 108 (90 Base) MCG/ACT inhaler Inhale 1-2 puffs into the lungs every 6 (six) hours as needed for wheezing or shortness of breath. And or cough. 1 Inhaler 0  . atorvastatin (LIPITOR) 80 MG tablet Take 1 tablet by mouth once daily 90 tablet 0  . carvedilol (COREG) 3.125 MG tablet Take 1 tablet by  mouth twice daily 180 tablet 3  . chlorthalidone (HYGROTON) 25 MG tablet Take 1/2 tablet (12.5 mg) by mouth every morning for 1-2 weeks; then increase to 1 tablet (25 mg) daily. 60 tablet 0  . clopidogrel (PLAVIX) 75 MG tablet Take 1 tablet by mouth once daily 90 tablet 0  . [facility] ASPIRIN 81 MG EC tablet Take 81 mg by mouth daily.  0  . lisinopril (ZESTRIL) 20 MG tablet Take 1 tablet (20 mg total) by mouth daily. 90 tablet 3   No current facility-administered medications for this visit.     Allergies  Allergen Reactions  . Erythromycin     nauseated    Past Medical History:  Diagnosis Date  . Aortic insufficiency    a. Mild AI by echo 2014.  . Arthritis   . CAD (coronary artery  disease)    a. Anterior STEMI 01/2015 s/p overlapping DES x2 to prox LAD, DES to prox RCA in [facility].  . Chronic back pain    stenosis/HNP  . History of bronchitis 2014  . History of colon polyps   . Hyperlipidemia   . Ischemic cardiomyopathy   . Joint pain   . Joint swelling   . Pneumonia 2014  . Smokers' cough ([place])   . Stress headaches   . Tobacco abuse     Height 5' 2.5" (1.588 m), weight 183 lb 6.4 oz (83.2 kg).   Tommy Medal PharmD CPP [facility] [address] [place], [place] [address] [phone]

## 2019-05-23 ENCOUNTER — Other Ambulatory Visit: Payer: Self-pay | Admitting: Cardiovascular Disease

## 2019-05-23 NOTE — Telephone Encounter (Signed)
Rx request sent to pharmacy.  

## 2019-06-11 NOTE — Progress Notes (Addendum)
Virtual Visit via Video Note  I connected with patient on 06/12/19 at 10:15 AM EST by audio enabled telemedicine application and verified that I am speaking with the correct person using two identifiers.   THIS ENCOUNTER IS A VIRTUAL VISIT DUE TO COVID-19 - PATIENT WAS NOT SEEN IN THE OFFICE. PATIENT HAS CONSENTED TO VIRTUAL VISIT / TELEMEDICINE VISIT   Location of patient: home  Location of provider: office  I discussed the limitations of evaluation and management by telemedicine and the availability of in person appointments. The patient expressed understanding and agreed to proceed.   Subjective:   Terri [name] is a 74 y.o. female who presents for an Initial Medicare Annual Wellness Visit.  Review of Systems    Home Safety/Smoke Alarms: Feels safe in home. Smoke alarms in place.  Lives with husband in 1 story home.   Female:        Mammo- 12/10/18.      Dexa scan-  10/03/17      CCS- declines at this time due to Covid      Objective:    Today's Vitals   06/12/19 1014  BP: 128/73  Pulse: 72  Weight: 180 lb (81.6 kg)  Height: 5' 2.5" (1.588 m)   Body mass index is 32.4 kg/m.  Advanced Directives 06/12/2019 07/15/2014  Does Patient Have a Medical Advance Directive? No No  Would patient like information on creating a medical advance directive? No - Patient declined No - patient declined information    Current Medications (verified) Outpatient Encounter Medications as of 06/12/2019  Medication Sig  . albuterol (PROVENTIL HFA;VENTOLIN HFA) 108 (90 Base) MCG/ACT inhaler Inhale 1-2 puffs into the lungs every 6 (six) hours as needed for wheezing or shortness of breath. And or cough.  Marland Kitchen atorvastatin (LIPITOR) 80 MG tablet Take 1 tablet by mouth once daily  . carvedilol (COREG) 3.125 MG tablet Take 1 tablet by mouth twice daily  . chlorthalidone (HYGROTON) 25 MG tablet Take 0.5 tablets (12.5 mg total) by mouth daily.  . clopidogrel (PLAVIX) 75 MG tablet Take 1 tablet by mouth  once daily  . [facility] ASPIRIN 81 MG EC tablet Take 81 mg by mouth daily.  Marland Kitchen lisinopril (ZESTRIL) 20 MG tablet Take 1 tablet (20 mg total) by mouth daily.   No facility-administered encounter medications on file as of 06/12/2019.    Allergies (verified) Erythromycin   History: Past Medical History:  Diagnosis Date  . Aortic insufficiency    a. Mild AI by echo 2014.  . Arthritis   . CAD (coronary artery disease)    a. Anterior STEMI 01/2015 s/p overlapping DES x2 to prox LAD, DES to prox RCA in [facility].  . Chronic back pain    stenosis/HNP  . History of bronchitis 2014  . History of colon polyps   . Hyperlipidemia   . Ischemic cardiomyopathy   . Joint pain   . Joint swelling   . Pneumonia 2014  . Smokers' cough ([place])   . Stress headaches   . Tobacco abuse    Past Surgical History:  Procedure Laterality Date  . ABDOMINAL HYSTERECTOMY     at age 32  . BREAST BIOPSY Right    roughly 30 years ago with [facility]   . cataract surgery Bilateral   . CHOLECYSTECTOMY    . colonscopy    . ERCP    . ESOPHAGOGASTRODUODENOSCOPY    . LUMBAR LAMINECTOMY Right 07/25/2014   Procedure: Right L4 Hemilaminectomy, Right  L4-5 Laminotomy , Decompression, Microdisectomy;  Surgeon: Marybelle Killings, MD;  Location: [facility];  Service: Orthopedics;  Laterality: Right;  . SHOULDER SURGERY Right   . TONSILLECTOMY     as a child   Family History  Problem Relation Age of Onset  . Heart disease Mother   . Heart failure Mother   . Heart disease Father   . Heart failure Father   . CAD Other        both parents and multiple siblings with either died or had-related issues   Social History   Socioeconomic History  . Marital status: Married    Spouse name: Not on file  . Number of children: Not on file  . Years of education: Not on file  . Highest education level: Not on file  Occupational History  . Not on file  Tobacco Use  . Smoking status: Current Every Day Smoker    Packs/day: 0.50     Years: 50.00    Pack years: 25.00    Types: Cigarettes  . Smokeless tobacco: Never Used  Substance and Sexual Activity  . Alcohol use: No  . Drug use: No  . Sexual activity: Yes    Birth control/protection: Surgical  Other Topics Concern  . Not on file  Social History Narrative  . Not on file   Social Determinants of Health   Financial Resource Strain:   . Difficulty of Paying Living Expenses: Not on file  Food Insecurity:   . Worried About Charity fundraiser in the Last Year: Not on file  . Ran Out of Food in the Last Year: Not on file  Transportation Needs:   . Lack of Transportation (Medical): Not on file  . Lack of Transportation (Non-Medical): Not on file  Physical Activity:   . Days of Exercise per Week: Not on file  . Minutes of Exercise per Session: Not on file  Stress:   . Feeling of Stress : Not on file  Social Connections:   . Frequency of Communication with Friends and Family: Not on file  . Frequency of Social Gatherings with Friends and Family: Not on file  . Attends Religious Services: Not on file  . Active Member of Clubs or Organizations: Not on file  . Attends Archivist Meetings: Not on file  . Marital Status: Not on file    Tobacco Counseling Ready to quit: No Counseling given: No   Clinical Intake: Pain : No/denies pain   Activities of Daily Living In your present state of health, do you have any difficulty performing the following activities: 06/12/2019  Hearing? Y  Comment pt states she has appt for hearing aids 06/13/2019  Vision? N  Difficulty concentrating or making decisions? N  Walking or climbing stairs? N  Dressing or bathing? N  Doing errands, shopping? N  Preparing Food and eating ? N  Using the Toilet? N  In the past six months, have you accidently leaked urine? N  Do you have problems with loss of bowel control? N  Managing your Medications? N  Managing your Finances? N  Housekeeping or managing your  Housekeeping? N  Some recent data might be hidden     Immunizations and Health Maintenance Immunization History  Administered Date(s) Administered  . Fluad Quad(high Dose 65+) 01/31/2019  . Influenza, High Dose Seasonal PF 03/16/2018  . Pneumococcal Polysaccharide-23 09/11/2017   Health Maintenance Due  Topic Date Due  . Hepatitis C Screening  03/19/1946  .  TETANUS/TDAP  10/07/1965  . COLONOSCOPY  10/07/1996  . PNA vac Low Risk Adult (2 of 2 - PCV13) 09/12/2018    Patient Care Team: Libby Maw, MD as PCP - General (Family Medicine)  Indicate any recent Medical Services you may have received from other than [facility] providers in the past year (date may be approximate).     Assessment:   This is a routine wellness examination for Keliah. Physical assessment deferred to PCP.  Hearing/Vision screen Unable to assess. This visit is enabled though telemedicine due to Covid 19.   Dietary issues and exercise activities discussed: Current Exercise Habits: The patient does not participate in regular exercise at present(pt states she walks 4000-6000steps per day), Exercise limited by: None identified Diet (meal preparation, eat out, water intake, caffeinated beverages, dairy products, fruits and vegetables): 24 hr recall  Breakfast: grits with eggs Lunch: skips Dinner:lasagna w/ toast  Goals    . Increase physical activity      Depression Screen PHQ 2/9 Scores 06/12/2019 08/21/2015 05/18/2015  PHQ - 2 Score 1 0 0    Fall Risk Fall Risk  06/12/2019  Falls in the past year? 1  Number falls in past yr: 0  Injury with Fall? 0  Follow up Education provided;Falls prevention discussed     Cognitive Function:   Ad8 score reviewed for issues:  Issues making decisions:no  Less interest in hobbies / activities:no  Repeats questions, stories (family complaining):no  Trouble using ordinary gadgets (microwave, computer, phone):no  Forgets the month or year: no   Mismanaging finances: no  Remembering appts:no  Daily problems with thinking and/or memory: no Ad8 score is=0       Screening Tests Health Maintenance  Topic Date Due  . Hepatitis C Screening  1945-07-10  . TETANUS/TDAP  10/07/1965  . COLONOSCOPY  10/07/1996  . PNA vac Low Risk Adult (2 of 2 - PCV13) 09/12/2018  . MAMMOGRAM  12/09/2020  . INFLUENZA VACCINE  Completed  . DEXA SCAN  Completed      Plan:    Please schedule your next medicare wellness visit with me in 1 yr.  Continue to eat heart healthy diet (full of fruits, vegetables, whole grains, lean protein, water--limit salt, fat, and sugar intake) and increase physical activity as tolerated.  Continue doing brain stimulating activities (puzzles, reading, adult coloring books, staying active) to keep memory sharp.   Bring a copy of your living will and/or healthcare power of attorney to your next office visit.    I have personally reviewed and noted the following in the patient's chart:   . Medical and social history . Use of alcohol, tobacco or illicit drugs  . Current medications and supplements . Functional ability and status . Nutritional status . Physical activity . Advanced directives . List of other physicians . Hospitalizations, surgeries, and ER visits in previous 12 months . Vitals . Screenings to include cognitive, depression, and falls . Referrals and appointments  In addition, I have reviewed and discussed with patient certain preventive protocols, quality metrics, and best practice recommendations. A written personalized care plan for preventive services as well as general preventive health recommendations were provided to patient.     Shela Nevin, [place]   06/12/2019    Reviewed and agree.

## 2019-06-12 ENCOUNTER — Encounter: Payer: Self-pay | Admitting: *Deleted

## 2019-06-12 ENCOUNTER — Ambulatory Visit (INDEPENDENT_AMBULATORY_CARE_PROVIDER_SITE_OTHER): Payer: Medicare Other | Admitting: *Deleted

## 2019-06-12 VITALS — BP 128/73 | HR 72 | Ht 62.5 in | Wt 180.0 lb

## 2019-06-12 DIAGNOSIS — Z0000 Encounter for general adult medical examination without abnormal findings: Secondary | ICD-10-CM | POA: Diagnosis not present

## 2019-06-12 NOTE — Patient Instructions (Signed)
Please schedule your next medicare wellness visit with me in 1 yr.  Continue to eat heart healthy diet (full of fruits, vegetables, whole grains, lean protein, water--limit salt, fat, and sugar intake) and increase physical activity as tolerated.  Continue doing brain stimulating activities (puzzles, reading, adult coloring books, staying active) to keep memory sharp.   Bring a copy of your living will and/or healthcare power of attorney to your next office visit.   Terri [name] , Thank you for taking time to come for your Medicare Wellness Visit. I appreciate your ongoing commitment to your health goals. Please review the following plan we discussed and let me know if I can assist you in the future.   These are the goals we discussed: Goals    . Increase physical activity       This is a list of the screening recommended for you and due dates:  Health Maintenance  Topic Date Due  .  Hepatitis C: One time screening is recommended by Center for Disease Control  (CDC) for  adults born from [birth year] through 1964.   03-22-1946  . Tetanus Vaccine  10/07/1965  . Colon Cancer Screening  10/07/1996  . Pneumonia vaccines (2 of 2 - PCV13) 09/12/2018  . Mammogram  12/09/2020  . Flu Shot  Completed  . DEXA scan (bone density measurement)  Completed    Preventive Care 65 Years and Older, Female Preventive care refers to lifestyle choices and visits with your health care provider that can promote health and wellness. This includes:  A yearly physical exam. This is also called an annual well check.  Regular dental and eye exams.  Immunizations.  Screening for certain conditions.  Healthy lifestyle choices, such as diet and exercise. What can I expect for my preventive care visit? Physical exam Your health care provider will check:  Height and weight. These may be used to calculate body mass index (BMI), which is a measurement that tells if you are at a healthy weight.  Heart rate and blood  pressure.  Your skin for abnormal spots. Counseling Your health care provider may ask you questions about:  Alcohol, tobacco, and drug use.  Emotional well-being.  Home and relationship well-being.  Sexual activity.  Eating habits.  History of falls.  Memory and ability to understand (cognition).  Work and work Statistician.  Pregnancy and menstrual history. What immunizations do I need?  Influenza (flu) vaccine  This is recommended every year. Tetanus, diphtheria, and pertussis (Tdap) vaccine  You may need a Td booster every 10 years. Varicella (chickenpox) vaccine  You may need this vaccine if you have not already been vaccinated. Zoster (shingles) vaccine  You may need this after age 70. Pneumococcal conjugate (PCV13) vaccine  One dose is recommended after age 53. Pneumococcal polysaccharide (PPSV23) vaccine  One dose is recommended after age 1. Measles, mumps, and rubella (MMR) vaccine  You may need at least one dose of MMR if you were born in 1957 or later. You may also need a second dose. Meningococcal conjugate (MenACWY) vaccine  You may need this if you have certain conditions. Hepatitis A vaccine  You may need this if you have certain conditions or if you travel or work in places where you may be exposed to hepatitis A. Hepatitis B vaccine  You may need this if you have certain conditions or if you travel or work in places where you may be exposed to hepatitis B. Haemophilus influenzae type b (Hib) vaccine  You  may need this if you have certain conditions. You may receive vaccines as individual doses or as more than one vaccine together in one shot (combination vaccines). Talk with your health care provider about the risks and benefits of combination vaccines. What tests do I need? Blood tests  Lipid and cholesterol levels. These may be checked every 5 years, or more frequently depending on your overall health.  Hepatitis C test.  Hepatitis B  test. Screening  Lung cancer screening. You may have this screening every year starting at age 81 if you have a 30-pack-year history of smoking and currently smoke or have quit within the past 15 years.  Colorectal cancer screening. All adults should have this screening starting at age 69 and continuing until age 1. Your health care provider may recommend screening at age 8 if you are at increased risk. You will have tests every 1-10 years, depending on your results and the type of screening test.  Diabetes screening. This is done by checking your blood sugar (glucose) after you have not eaten for a while (fasting). You may have this done every 1-3 years.  Mammogram. This may be done every 1-2 years. Talk with your health care provider about how often you should have regular mammograms.  BRCA-related cancer screening. This may be done if you have a family history of breast, ovarian, tubal, or peritoneal cancers. Other tests  Sexually transmitted disease (STD) testing.  Bone density scan. This is done to screen for osteoporosis. You may have this done starting at age 57. Follow these instructions at home: Eating and drinking  Eat a diet that includes fresh fruits and vegetables, whole grains, lean protein, and low-fat dairy products. Limit your intake of foods with high amounts of sugar, saturated fats, and salt.  Take vitamin and mineral supplements as recommended by your health care provider.  Do not drink alcohol if your health care provider tells you not to drink.  If you drink alcohol: ? Limit how much you have to 0-1 drink a day. ? Be aware of how much alcohol is in your drink. In the [place] one drink equals one 12 oz bottle of beer (355 mL), one 5 oz glass of wine (148 mL), or one 1 oz glass of hard liquor (44 mL). Lifestyle  Take daily care of your teeth and gums.  Stay active. Exercise for at least 30 minutes on 5 or more days each week.  Do not use any products that  contain nicotine or tobacco, such as cigarettes, e-cigarettes, and chewing tobacco. If you need help quitting, ask your health care provider.  If you are sexually active, practice safe sex. Use a condom or other form of protection in order to prevent STIs (sexually transmitted infections).  Talk with your health care provider about taking a low-dose aspirin or statin. What's next?  Go to your health care provider once a year for a well check visit.  Ask your health care provider how often you should have your eyes and teeth checked.  Stay up to date on all vaccines. This information is not intended to replace advice given to you by your health care provider. Make sure you discuss any questions you have with your health care provider. Document Revised: 05/17/2018 Document Reviewed: 05/17/2018 Elsevier Patient Education  2020 [employer].

## 2019-06-26 ENCOUNTER — Ambulatory Visit (INDEPENDENT_AMBULATORY_CARE_PROVIDER_SITE_OTHER): Payer: Medicare Other | Admitting: Family Medicine

## 2019-06-26 ENCOUNTER — Other Ambulatory Visit: Payer: Self-pay

## 2019-06-26 ENCOUNTER — Encounter: Payer: Self-pay | Admitting: Family Medicine

## 2019-06-26 DIAGNOSIS — R21 Rash and other nonspecific skin eruption: Secondary | ICD-10-CM | POA: Diagnosis not present

## 2019-06-26 NOTE — Progress Notes (Signed)
Established Patient Office Visit  Subjective:  Patient ID: Terri [name], female    DOB: 02/11/1946  Age: 74 y.o. MRN: [id number]  CC: No chief complaint on file.   HPI Terri [name] presents for evaluation treatment of a possible shingles rash.  She has had it multiple times.  She has been unable to use the WebEx over my chart functions.  Past Medical History:  Diagnosis Date  . Aortic insufficiency    a. Mild AI by echo 2014.  . Arthritis   . CAD (coronary artery disease)    a. Anterior STEMI 01/2015 s/p overlapping DES x2 to prox LAD, DES to prox RCA in [facility].  . Chronic back pain    stenosis/HNP  . History of bronchitis 2014  . History of colon polyps   . Hyperlipidemia   . Ischemic cardiomyopathy   . Joint pain   . Joint swelling   . Pneumonia 2014  . Smokers' cough ([place])   . Stress headaches   . Tobacco abuse     Past Surgical History:  Procedure Laterality Date  . ABDOMINAL HYSTERECTOMY     at age 30  . BREAST BIOPSY Right    roughly 30 years ago with [facility]   . cataract surgery Bilateral   . CHOLECYSTECTOMY    . colonscopy    . ERCP    . ESOPHAGOGASTRODUODENOSCOPY    . LUMBAR LAMINECTOMY Right 07/25/2014   Procedure: Right L4 Hemilaminectomy, Right L4-5 Laminotomy , Decompression, Microdisectomy;  Surgeon: Marybelle Killings, MD;  Location: [place];  Service: Orthopedics;  Laterality: Right;  . SHOULDER SURGERY Right   . TONSILLECTOMY     as a child    Family History  Problem Relation Age of Onset  . Heart disease Mother   . Heart failure Mother   . Heart disease Father   . Heart failure Father   . CAD Other        both parents and multiple siblings with either died or had-related issues    Social History   Socioeconomic History  . Marital status: Married    Spouse name: Not on file  . Number of children: Not on file  . Years of education: Not on file  . Highest education level: Not on file  Occupational History  . Not on file    Tobacco Use  . Smoking status: Current Every Day Smoker    Packs/day: 0.50    Years: 50.00    Pack years: 25.00    Types: Cigarettes  . Smokeless tobacco: Never Used  Substance and Sexual Activity  . Alcohol use: No  . Drug use: No  . Sexual activity: Yes    Birth control/protection: Surgical  Other Topics Concern  . Not on file  Social History Narrative  . Not on file   Social Determinants of Health   Financial Resource Strain:   . Difficulty of Paying Living Expenses: Not on file  Food Insecurity:   . Worried About Charity fundraiser in the Last Year: Not on file  . Ran Out of Food in the Last Year: Not on file  Transportation Needs:   . Lack of Transportation (Medical): Not on file  . Lack of Transportation (Non-Medical): Not on file  Physical Activity:   . Days of Exercise per Week: Not on file  . Minutes of Exercise per Session: Not on file  Stress:   . Feeling of Stress : Not on file  Social Connections:   . Frequency of Communication with Friends and Family: Not on file  . Frequency of Social Gatherings with Friends and Family: Not on file  . Attends Religious Services: Not on file  . Active Member of Clubs or Organizations: Not on file  . Attends Archivist Meetings: Not on file  . Marital Status: Not on file  Intimate Partner Violence:   . Fear of Current or Ex-Partner: Not on file  . Emotionally Abused: Not on file  . Physically Abused: Not on file  . Sexually Abused: Not on file    Outpatient Medications Prior to Visit  Medication Sig Dispense Refill  . albuterol (PROVENTIL HFA;VENTOLIN HFA) 108 (90 Base) MCG/ACT inhaler Inhale 1-2 puffs into the lungs every 6 (six) hours as needed for wheezing or shortness of breath. And or cough. 1 Inhaler 0  . atorvastatin (LIPITOR) 80 MG tablet Take 1 tablet by mouth once daily 90 tablet 0  . carvedilol (COREG) 3.125 MG tablet Take 1 tablet by mouth twice daily 180 tablet 3  . chlorthalidone (HYGROTON) 25  MG tablet Take 0.5 tablets (12.5 mg total) by mouth daily. 45 tablet 3  . clopidogrel (PLAVIX) 75 MG tablet Take 1 tablet by mouth once daily 90 tablet 0  . [facility] ASPIRIN 81 MG EC tablet Take 81 mg by mouth daily.  0  . lisinopril (ZESTRIL) 20 MG tablet Take 1 tablet (20 mg total) by mouth daily. 90 tablet 3   No facility-administered medications prior to visit.    Allergies  Allergen Reactions  . Erythromycin     nauseated    ROS Review of Systems  Constitutional: Negative.   Respiratory: Negative.   Cardiovascular: Negative.   Gastrointestinal: Negative.   Skin: Positive for rash.  Psychiatric/Behavioral: Negative.       Objective:    Physical Exam  Constitutional: She is oriented to person, place, and time. No distress.  Pulmonary/Chest: Effort normal.  Neurological: She is oriented to person, place, and time.  Psychiatric: She has a normal mood and affect. Her behavior is normal.    There were no vitals taken for this visit. Wt Readings from Last 3 Encounters:  06/12/19 180 lb (81.6 kg)  04/29/19 183 lb 6.4 oz (83.2 kg)  04/11/19 185 lb 9.6 oz (84.2 kg)     Health Maintenance Due  Topic Date Due  . Hepatitis C Screening  1946/06/01  . TETANUS/TDAP  10/07/1965  . COLONOSCOPY  10/07/1996  . PNA vac Low Risk Adult (2 of 2 - PCV13) 09/12/2018    There are no preventive care reminders to display for this patient.  Lab Results  Component Value Date   TSH 1.75 01/31/2019   Lab Results  Component Value Date   WBC 10.5 08/25/2017   HGB 13.1 08/25/2017   HCT 39.8 08/25/2017   MCV 88.4 08/25/2017   PLT 230 08/25/2017   Lab Results  Component Value Date   NA 139 02/07/2019   K 4.1 02/07/2019   CO2 19 (L) 02/07/2019   GLUCOSE 79 02/07/2019   BUN 18 02/07/2019   CREATININE 0.83 02/07/2019   BILITOT 0.3 01/17/2019   ALKPHOS 135 (H) 01/17/2019   AST 11 01/17/2019   ALT 15 01/17/2019   PROT 6.1 01/17/2019   ALBUMIN 4.3 01/17/2019   CALCIUM 8.8 02/07/2019    ANIONGAP 9 08/25/2017   GFR 67.25 03/07/2018   Lab Results  Component Value Date   CHOL 129 01/17/2019   Lab  Results  Component Value Date   HDL 41 01/17/2019   Lab Results  Component Value Date   LDLCALC 55 01/17/2019   Lab Results  Component Value Date   TRIG 163 (H) 01/17/2019   Lab Results  Component Value Date   CHOLHDL 3.3 09/14/2016   Lab Results  Component Value Date   HGBA1C 6.0 02/27/2015      Assessment & Plan:   Problem List Items Addressed This Visit      Musculoskeletal and Integument   Rash - Primary      No orders of the defined types were placed in this encounter.   Follow-up: No follow-ups on file.  I will need to see the rash.  We will work the patient in tomorrow the next day.  Patient agrees with plan.  Libby Maw, MD   Virtual Visit via Video Note  I connected with [name] on 06/26/19 at 10:00 AM EST by a video enabled telemedicine application and verified that I am speaking with the correct person using two identifiers.  Location: Patient: home with her husband. Provider:    I discussed the limitations of evaluation and management by telemedicine and the availability of in person appointments. The patient expressed understanding and agreed to proceed.  History of Present Illness:    Observations/Objective:   Assessment and Plan:   Follow Up Instructions:    I discussed the assessment and treatment plan with the patient. The patient was provided an opportunity to ask questions and all were answered. The patient agreed with the plan and demonstrated an understanding of the instructions.   The patient was advised to call back or seek an in-person evaluation if the symptoms worsen or if the condition fails to improve as anticipated.  I provided 10 minutes of non-face-to-face time during this encounter.   Libby Maw, MD   Interactive video and audio telecommunications were attempted between  myself and the patient. However they failed due to the patient having technical difficulties or not having access to video capability. We continued and completed with audio only.Interactive video and audio telecommunications were attempted between myself and the patient. However they failed due to the patient having technical difficulties or not having access to video capability. We continued and completed with audio only.

## 2019-06-27 ENCOUNTER — Ambulatory Visit (INDEPENDENT_AMBULATORY_CARE_PROVIDER_SITE_OTHER): Payer: Medicare Other | Admitting: Family Medicine

## 2019-06-27 ENCOUNTER — Encounter: Payer: Self-pay | Admitting: Family Medicine

## 2019-06-27 VITALS — BP 122/70 | HR 71 | Temp 97.1°F | Ht 62.0 in | Wt 183.0 lb

## 2019-06-27 DIAGNOSIS — B009 Herpesviral infection, unspecified: Secondary | ICD-10-CM | POA: Diagnosis not present

## 2019-06-27 DIAGNOSIS — I10 Essential (primary) hypertension: Secondary | ICD-10-CM

## 2019-06-27 MED ORDER — VALACYCLOVIR HCL 1 G PO TABS: ORAL_TABLET | ORAL | 5 refills | Status: DC

## 2019-06-27 NOTE — Progress Notes (Signed)
Established Patient Office Visit  Subjective:  Patient ID: Terri [name], female    DOB: 11/13/1945  Age: 74 y.o. MRN: [id number]  CC:  Chief Complaint  Patient presents with  . Rash    rash on right side of hip/glut area x 3 days pt states that it burns and itchy.     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  Denies specific antecedent pain or lower back pain.  This is been going on for years.  Treated successfully in the past with Valtrex.  The rash burns and stings.  Blood pressure well controlled with lisinopril, chlorthalidone and carvedilol.  She has had her flu shot this year and is scheduled for Covid vaccine next week.  Past Medical History:  Diagnosis Date  . Aortic insufficiency    a. Mild AI by echo 2014.  . Arthritis   . CAD (coronary artery disease)    a. Anterior STEMI 01/2015 s/p overlapping DES x2 to prox LAD, DES to prox RCA in [facility].  . Chronic back pain    stenosis/HNP  . History of bronchitis 2014  . History of colon polyps   . Hyperlipidemia   . Ischemic cardiomyopathy   . Joint pain   . Joint swelling   . Pneumonia 2014  . Smokers' cough ([place])   . Stress headaches   . Tobacco abuse     Past Surgical History:  Procedure Laterality Date  . ABDOMINAL HYSTERECTOMY     at age 37  . BREAST BIOPSY Right    roughly 30 years ago with [facility]   . cataract surgery Bilateral   . CHOLECYSTECTOMY    . colonscopy    . ERCP    . ESOPHAGOGASTRODUODENOSCOPY    . LUMBAR LAMINECTOMY Right 07/25/2014   Procedure: Right L4 Hemilaminectomy, Right L4-5 Laminotomy , Decompression, Microdisectomy;  Surgeon: Marybelle Killings, MD;  Location: [place];  Service: Orthopedics;  Laterality: Right;  . SHOULDER SURGERY Right   . TONSILLECTOMY     as a child    Family History  Problem Relation Age of Onset  . Heart disease Mother   . Heart failure Mother   . Heart disease Father    . Heart failure Father   . CAD Other        both parents and multiple siblings with either died or had-related issues    Social History   Socioeconomic History  . Marital status: Married    Spouse name: Not on file  . Number of children: Not on file  . Years of education: Not on file  . Highest education level: Not on file  Occupational History  . Not on file  Tobacco Use  . Smoking status: Current Every Day Smoker    Packs/day: 0.50    Years: 50.00    Pack years: 25.00    Types: Cigarettes  . Smokeless tobacco: Never Used  Substance and Sexual Activity  . Alcohol use: No  . Drug use: No  . Sexual activity: Yes    Birth control/protection: Surgical  Other Topics Concern  . Not on file  Social History Narrative  . Not on file   Social Determinants of Health   Financial Resource Strain:   . Difficulty of Paying Living Expenses: Not on file  Food Insecurity:   . Worried About Charity fundraiser in the Last Year: Not on file  .  Ran Out of Food in the Last Year: Not on file  Transportation Needs:   . Lack of Transportation (Medical): Not on file  . Lack of Transportation (Non-Medical): Not on file  Physical Activity:   . Days of Exercise per Week: Not on file  . Minutes of Exercise per Session: Not on file  Stress:   . Feeling of Stress : Not on file  Social Connections:   . Frequency of Communication with Friends and Family: Not on file  . Frequency of Social Gatherings with Friends and Family: Not on file  . Attends Religious Services: Not on file  . Active Member of Clubs or Organizations: Not on file  . Attends Archivist Meetings: Not on file  . Marital Status: Not on file  Intimate Partner Violence:   . Fear of Current or Ex-Partner: Not on file  . Emotionally Abused: Not on file  . Physically Abused: Not on file  . Sexually Abused: Not on file    Outpatient Medications Prior to Visit  Medication Sig Dispense Refill  . albuterol (PROVENTIL  HFA;VENTOLIN HFA) 108 (90 Base) MCG/ACT inhaler Inhale 1-2 puffs into the lungs every 6 (six) hours as needed for wheezing or shortness of breath. And or cough. 1 Inhaler 0  . atorvastatin (LIPITOR) 80 MG tablet Take 1 tablet by mouth once daily 90 tablet 0  . carvedilol (COREG) 3.125 MG tablet Take 1 tablet by mouth twice daily 180 tablet 3  . chlorthalidone (HYGROTON) 25 MG tablet Take 0.5 tablets (12.5 mg total) by mouth daily. 45 tablet 3  . clopidogrel (PLAVIX) 75 MG tablet Take 1 tablet by mouth once daily 90 tablet 0  . [facility] ASPIRIN 81 MG EC tablet Take 81 mg by mouth daily.  0  . lisinopril (ZESTRIL) 20 MG tablet Take 1 tablet (20 mg total) by mouth daily. 90 tablet 3   No facility-administered medications prior to visit.    Allergies  Allergen Reactions  . Erythromycin     nauseated    ROS Review of Systems  Constitutional: Negative.   Respiratory: Negative.   Cardiovascular: Negative.   Gastrointestinal: Negative.   Genitourinary: Negative.   Musculoskeletal: Negative for back pain.  Skin: Positive for color change and rash.  Allergic/Immunologic: Negative for immunocompromised state.  Neurological: Negative for tremors and speech difficulty.  Hematological: Does not bruise/bleed easily.  Psychiatric/Behavioral: Negative.       Objective:    Physical Exam  Constitutional: She is oriented to person, place, and time. She appears well-developed and well-nourished. No distress.  HENT:  Head: Atraumatic.  Right Ear: External ear normal.  Left Ear: External ear normal.  Eyes: Conjunctivae are normal. Right eye exhibits no discharge. Left eye exhibits no discharge. No scleral icterus.  Pulmonary/Chest: Effort normal.  Neurological: She is alert and oriented to person, place, and time.  Skin: Skin is warm and dry. She is not diaphoretic.     Psychiatric: She has a normal mood and affect. Her behavior is normal.    BP 122/70   Pulse 71   Temp (!) 97.1 F (36.2 C)  (Tympanic)   Ht 5\' 2"  (1.575 m)   Wt 183 lb (83 kg)   SpO2 97%   BMI 33.47 kg/m  Wt Readings from Last 3 Encounters:  06/27/19 183 lb (83 kg)  06/12/19 180 lb (81.6 kg)  04/29/19 183 lb 6.4 oz (83.2 kg)     Health Maintenance Due  Topic Date Due  .  Hepatitis C Screening  [birth year]  . TETANUS/TDAP  10/07/1965  . COLONOSCOPY  10/07/1996  . PNA vac Low Risk Adult (2 of 2 - PCV13) 09/12/2018    There are no preventive care reminders to display for this patient.  Lab Results  Component Value Date   TSH 1.75 01/31/2019   Lab Results  Component Value Date   WBC 10.5 08/25/2017   HGB 13.1 08/25/2017   HCT 39.8 08/25/2017   MCV 88.4 08/25/2017   PLT 230 08/25/2017   Lab Results  Component Value Date   NA 139 02/07/2019   K 4.1 02/07/2019   CO2 19 (L) 02/07/2019   GLUCOSE 79 02/07/2019   BUN 18 02/07/2019   CREATININE 0.83 02/07/2019   BILITOT 0.3 01/17/2019   ALKPHOS 135 (H) 01/17/2019   AST 11 01/17/2019   ALT 15 01/17/2019   PROT 6.1 01/17/2019   ALBUMIN 4.3 01/17/2019   CALCIUM 8.8 02/07/2019   ANIONGAP 9 08/25/2017   GFR 67.25 03/07/2018   Lab Results  Component Value Date   CHOL 129 01/17/2019   Lab Results  Component Value Date   HDL 41 01/17/2019   Lab Results  Component Value Date   LDLCALC 55 01/17/2019   Lab Results  Component Value Date   TRIG 163 (H) 01/17/2019   Lab Results  Component Value Date   CHOLHDL 3.3 09/14/2016   Lab Results  Component Value Date   HGBA1C 6.0 02/27/2015      Assessment & Plan:   Problem List Items Addressed This Visit      Cardiovascular and Mediastinum   Essential hypertension     Other   Herpes virus disease - Primary   Relevant Medications   valACYclovir (VALTREX) 1000 MG tablet      Meds ordered this encounter  Medications  . valACYclovir (VALTREX) 1000 MG tablet    Sig: Take 1/2 tablet three times daily for 7 days.    Dispense:  7 tablet    Refill:  5    Follow-up: No follow-ups on  file.   With patient's history and physical exam I believe that this rash is more consistent with cutaneous herpes and it is with zoster.  Still encouraged her to go and have the Shingrix vaccine.  We will treated as though it is zoster with above Valtrex dosing.  Libby Maw, MD

## 2019-08-29 ENCOUNTER — Other Ambulatory Visit: Payer: Self-pay | Admitting: Cardiovascular Disease

## 2019-12-02 ENCOUNTER — Other Ambulatory Visit: Payer: Self-pay | Admitting: Cardiovascular Disease

## 2020-02-16 ENCOUNTER — Other Ambulatory Visit: Payer: Self-pay | Admitting: Cardiovascular Disease

## 2020-03-11 ENCOUNTER — Other Ambulatory Visit (HOSPITAL_BASED_OUTPATIENT_CLINIC_OR_DEPARTMENT_OTHER): Payer: Self-pay | Admitting: Family Medicine

## 2020-03-11 ENCOUNTER — Other Ambulatory Visit: Payer: Self-pay | Admitting: Cardiovascular Disease

## 2020-03-11 DIAGNOSIS — Z1231 Encounter for screening mammogram for malignant neoplasm of breast: Secondary | ICD-10-CM

## 2020-03-11 NOTE — Telephone Encounter (Signed)
Please contact pt for future 12 month follow up last seen 01/2019.

## 2020-03-21 ENCOUNTER — Ambulatory Visit: Payer: Medicare Other | Attending: Internal Medicine

## 2020-03-21 ENCOUNTER — Other Ambulatory Visit: Payer: Self-pay

## 2020-03-21 DIAGNOSIS — Z23 Encounter for immunization: Secondary | ICD-10-CM

## 2020-03-21 NOTE — Progress Notes (Signed)
   Covid-19 Vaccination Clinic  Name:  Terri [name]    MRN: [id number] DOB: January 16, 1946  03/21/2020  [name] was observed post Covid-19 immunization for 15 minutes without incident. She was provided with Vaccine Information Sheet and instruction to access the V-Safe system.   [name] was instructed to call 911 with any severe reactions post vaccine: Marland Kitchen Difficulty breathing  . Swelling of face and throat  . A fast heartbeat  . A bad rash all over body  . Dizziness and weakness

## 2020-03-31 ENCOUNTER — Ambulatory Visit (HOSPITAL_BASED_OUTPATIENT_CLINIC_OR_DEPARTMENT_OTHER): Payer: Medicare Other

## 2020-04-22 ENCOUNTER — Other Ambulatory Visit: Payer: Self-pay | Admitting: Cardiovascular Disease

## 2020-05-11 ENCOUNTER — Ambulatory Visit (HOSPITAL_BASED_OUTPATIENT_CLINIC_OR_DEPARTMENT_OTHER): Payer: Medicare Other

## 2020-05-25 ENCOUNTER — Other Ambulatory Visit: Payer: Self-pay | Admitting: Cardiovascular Disease

## 2020-06-11 ENCOUNTER — Other Ambulatory Visit: Payer: Self-pay | Admitting: Cardiovascular Disease

## 2020-06-12 ENCOUNTER — Telehealth: Payer: Self-pay | Admitting: Family Medicine

## 2020-06-12 NOTE — Telephone Encounter (Signed)
Left message for patient to schedule Annual Wellness Visit.  Please schedule with Nurse Health Advisor Martha Stanley, RN at [place]  °

## 2020-06-23 ENCOUNTER — Telehealth: Payer: Self-pay | Admitting: Family Medicine

## 2020-06-23 NOTE — Telephone Encounter (Signed)
Left message for patient to schedule Annual Wellness Visit.  Please schedule with Nurse Health Advisor Martha Stanley, RN at [place]  °

## 2020-06-25 ENCOUNTER — Ambulatory Visit (HOSPITAL_BASED_OUTPATIENT_CLINIC_OR_DEPARTMENT_OTHER)
Admission: RE | Admit: 2020-06-25 | Discharge: 2020-06-25 | Disposition: A | Discharge status: Home / Self Care | Payer: Medicare Other | Source: Ambulatory Visit | Attending: Family Medicine | Admitting: Family Medicine

## 2020-06-25 ENCOUNTER — Encounter (HOSPITAL_BASED_OUTPATIENT_CLINIC_OR_DEPARTMENT_OTHER): Payer: Self-pay

## 2020-06-25 ENCOUNTER — Other Ambulatory Visit: Payer: Self-pay

## 2020-06-25 DIAGNOSIS — Z1231 Encounter for screening mammogram for malignant neoplasm of breast: Secondary | ICD-10-CM | POA: Diagnosis not present

## 2020-06-29 ENCOUNTER — Other Ambulatory Visit: Payer: Self-pay | Admitting: Cardiovascular Disease

## 2020-07-13 NOTE — Progress Notes (Signed)
Subjective:   Terri [name] is a 75 y.o. female who presents for Medicare Annual (Subsequent) preventive examination.  Review of Systems     Cardiac Risk Factors include: advanced age (>32men, >109 women);hypertension;dyslipidemia;obesity (BMI >30kg/m2);sedentary lifestyle     Objective:    Today's Vitals   07/14/20 1241 07/14/20 1246  BP: 128/86   Pulse: 79   Resp: 16   Temp: (!) 96.8 F (36 C)   TempSrc: Temporal   SpO2: 97%   Weight: 185 lb 9.6 oz (84.2 kg)   Height: 5\' 2"  (1.575 m)   PainSc:  3    Body mass index is 33.95 kg/m.  Advanced Directives 07/14/2020 06/12/2019 07/15/2014  Does Patient Have a Medical Advance Directive? No No No  Would patient like information on creating a medical advance directive? No - Patient declined No - Patient declined No - patient declined information    Current Medications (verified)   Allergies (verified) Erythromycin   History: Past Medical History:  Diagnosis Date  . Aortic insufficiency    a. Mild AI by echo 2014.  . Arthritis   . CAD (coronary artery disease)    a. Anterior STEMI 01/2015 s/p overlapping DES x2 to prox LAD, DES to prox RCA in [facility].  . Chronic back pain    stenosis/HNP  . History of bronchitis 2014  . History of colon polyps   . Hyperlipidemia   . Ischemic cardiomyopathy   . Joint pain   . Joint swelling   . Pneumonia 2014  . Smokers' cough (HCC)   . Stress headaches   . Tobacco abuse    Past Surgical History:  Procedure Laterality Date  . ABDOMINAL HYSTERECTOMY     at age 50  . BREAST BIOPSY Right    roughly 30 years ago with [facility]   . cataract surgery Bilateral   . CHOLECYSTECTOMY    . colonscopy    . ERCP    . ESOPHAGOGASTRODUODENOSCOPY    . LUMBAR LAMINECTOMY Right 07/25/2014   Procedure: Right L4 Hemilaminectomy, Right L4-5 Laminotomy , Decompression, Microdisectomy;  Surgeon: Eldred Manges, MD;  Location: [facility] OR;  Service: Orthopedics;  Laterality: Right;  . SHOULDER  SURGERY Right   . TONSILLECTOMY     as a child   Family History  Problem Relation Age of Onset  . Heart disease Mother   . Heart failure Mother   . Heart disease Father   . Heart failure Father   . CAD Other        both parents and multiple siblings with either died or had-related issues  . Breast cancer Sister    Social History   Socioeconomic History  . Marital status: Married    Spouse name: Not on file  . Number of children: Not on file  . Years of education: Not on file  . Highest education level: Not on file  Occupational History  . Not on file  Tobacco Use  . Smoking status: Current Every Day Smoker    Packs/day: 0.50    Years: 50.00    Pack years: 25.00    Types: Cigarettes  . Smokeless tobacco: Never Used  Substance and Sexual Activity  . Alcohol use: No  . Drug use: No  . Sexual activity: Yes    Birth control/protection: Surgical  Other Topics Concern  . Not on file  Social History Narrative  . Not on file   Social Determinants of Health   Financial Resource  Strain: Low Risk   . Difficulty of Paying Living Expenses: Not hard at all  Food Insecurity: No Food Insecurity  . Worried About Programme researcher, broadcasting/film/video in the Last Year: Never true  . Ran Out of Food in the Last Year: Never true  Transportation Needs: No Transportation Needs  . Lack of Transportation (Medical): No  . Lack of Transportation (Non-Medical): No  Physical Activity: Inactive  . Days of Exercise per Week: 0 days  . Minutes of Exercise per Session: 0 min  Stress: No Stress Concern Present  . Feeling of Stress : Only a little  Social Connections: Moderately Integrated  . Frequency of Communication with Friends and Family: More than three times a week  . Frequency of Social Gatherings with Friends and Family: More than three times a week  . Attends Religious Services: 1 to 4 times per year  . Active Member of Clubs or Organizations: No  . Attends Banker Meetings: Never  .  Marital Status: Married    Tobacco Counseling Ready to quit: Not Answered Counseling given: Not Answered   Clinical Intake:  Pre-visit preparation completed: Yes  Pain : 0-10 Pain Score: 3  Pain Type: Chronic pain Pain Location: Back (and leg) Pain Onset: More than a month ago Pain Frequency: Intermittent     Nutritional Status: BMI > 30  Obese Nutritional Risks: None Diabetes: No  How often do you need to have someone help you when you read instructions, pamphlets, or other written materials from your doctor or pharmacy?: 1 - Never  Diabetic?No  Interpreter Needed?: No  Information entered by :: Thomasenia Sales LPN   Activities of Daily Living In your present state of health, do you have any difficulty performing the following activities: 07/14/2020  Hearing? Y  Comment hearing aids  Vision? N  Difficulty concentrating or making decisions? N  Walking or climbing stairs? N  Dressing or bathing? N  Doing errands, shopping? N  Preparing Food and eating ? N  Using the Toilet? N  In the past six months, have you accidently leaked urine? Y  Comment occasionally  Do you have problems with loss of bowel control? N  Managing your Medications? N  Managing your Finances? N  Housekeeping or managing your Housekeeping? N  Some recent data might be hidden    Patient Care Team: Mliss Sax, MD as PCP - General (Family Medicine)  Indicate any recent Medical Services you may have received from other than [facility] providers in the past year (date may be approximate).     Assessment:   This is a routine wellness examination for Terri [name].  Hearing/Vision screen  Hearing Screening   125Hz  250Hz  500Hz  1000Hz  2000Hz  3000Hz  4000Hz  6000Hz  8000Hz   Right ear:           Left ear:           Comments: Bilateral hearing aids  Vision Screening Comments: Last eye exam-2020-Dr. Groat  Dietary issues and exercise activities discussed: Current Exercise Habits: The patient does  not participate in regular exercise at present (pt states she walks around her house sometimes for exercise), Exercise limited by: None identified  Goals    . Increase physical activity      Depression Screen PHQ 2/9 Scores 07/14/2020 06/27/2019 06/12/2019 08/21/2015 05/18/2015  PHQ - 2 Score 1 0 1 0 0    Fall Risk Fall Risk  07/14/2020 06/27/2019 06/12/2019  Falls in the past year? 1 1 1   Number falls  in past yr: 1 1 0  Injury with Fall? 0 0 0  Comment - No injury per pt just bruised -  Risk for fall due to : History of fall(s) - -  Follow up Falls prevention discussed - Education provided;Falls prevention discussed    FALL RISK PREVENTION PERTAINING TO THE HOME:  Any stairs in or around the home? Yes  If so, are there any without handrails? No  Home free of loose throw rugs in walkways, pet beds, electrical cords, etc? Yes  Adequate lighting in your home to reduce risk of falls? Yes   ASSISTIVE DEVICES UTILIZED TO PREVENT FALLS:  Life alert? No  Use of a cane, walker or w/c? No  Grab bars in the bathroom? Yes  Shower chair or bench in shower? Yes  Elevated toilet seat or a handicapped toilet? No   TIMED UP AND GO:  Was the test performed? Yes .  Length of time to ambulate 10 feet: 10 sec.   Gait slow and steady without use of assistive device  Cognitive Function:Normal cognitive status assessed by direct observation by this Nurse Health Advisor. No abnormalities found.          Immunizations Immunization History  Administered Date(s) Administered  . Fluad Quad(high Dose 65+) 01/31/2019  . Influenza, High Dose Seasonal PF 03/16/2018  . PFIZER(Purple Top)SARS-COV-2 Vaccination 03/21/2020  . Pneumococcal Polysaccharide-23 09/11/2017    TDAP status: Due, Education has been provided regarding the importance of this vaccine. Advised may receive this vaccine at local pharmacy or Health Dept. Aware to provide a copy of the vaccination record if obtained from local pharmacy or  Health Dept. Verbalized acceptance and understanding.  Flu Vaccine status: Up to date  Pneumococcal vaccine status: Completed during today's visit.  Covid-19 vaccine status: Completed vaccines  Qualifies for Shingles Vaccine? Yes   Zostavax completed No   Shingrix Completed?: No.    Education has been provided regarding the importance of this vaccine. Patient has been advised to call insurance company to determine out of pocket expense if they have not yet received this vaccine. Advised may also receive vaccine at local pharmacy or Health Dept. Verbalized acceptance and understanding.  Screening Tests Health Maintenance  Topic Date Due  . Hepatitis C Screening  Never done  . TETANUS/TDAP  Never done  . COLONOSCOPY (Pts 45-83yrs Insurance coverage will need to be confirmed)  Never done  . PNA vac Low Risk Adult (2 of 2 - PCV13) 09/12/2018  . INFLUENZA VACCINE  01/05/2020  . COVID-19 Vaccine (2 - Pfizer risk 4-dose series) 04/11/2020  . MAMMOGRAM  06/25/2022  . DEXA SCAN  Completed    Health Maintenance  Health Maintenance Due  Topic Date Due  . Hepatitis C Screening  Never done  . TETANUS/TDAP  Never done  . COLONOSCOPY (Pts 45-16yrs Insurance coverage will need to be confirmed)  Never done  . PNA vac Low Risk Adult (2 of 2 - PCV13) 09/12/2018  . INFLUENZA VACCINE  01/05/2020  . COVID-19 Vaccine (2 - Pfizer risk 4-dose series) 04/11/2020    Colorectal cancer screening: Referral to GI placed today. Pt aware the office will call re: appt.  Mammogram status: Completed Bilateral 06/27/2020. Repeat every year  Bone Density status: Due-declined today. She would like to complete with mammogram next year  Lung Cancer Screening: (Low Dose CT Chest recommended if Age 82-80 years, 30 pack-year currently smoking OR have quit w/in 15years.) does not qualify.     Additional Screening:  Hepatitis C Screening: does qualify; Discuss with PCP  Vision Screening: Recommended annual  ophthalmology exams for early detection of glaucoma and other disorders of the eye. Is the patient up to date with their annual eye exam?  No  Who is the provider or what is the name of the office in which the patient attends annual eye exams? Dr. Dione Booze Patient plans to make an appt soon  Dental Screening: Recommended annual dental exams for proper oral hygiene  Community Resource Referral / Chronic Care Management: CRR required this visit?  No   CCM required this visit?  No      Plan:     I have personally reviewed and noted the following in the patient's chart:   . Medical and social history . Use of alcohol, tobacco or illicit drugs  . Current medications and supplements . Functional ability and status . Nutritional status . Physical activity . Advanced directives . List of other physicians . Hospitalizations, surgeries, and ER visits in previous 12 months . Vitals . Screenings to include cognitive, depression, and falls . Referrals and appointments  In addition, I have reviewed and discussed with patient certain preventive protocols, quality metrics, and best practice recommendations. A written personalized care plan for preventive services as well as general preventive health recommendations were provided to patient.     Roanna Raider, LPN   12/12/2954  Nurse Health Advisor  Nurse Notes: None

## 2020-07-14 ENCOUNTER — Ambulatory Visit (INDEPENDENT_AMBULATORY_CARE_PROVIDER_SITE_OTHER): Payer: Medicare Other

## 2020-07-14 ENCOUNTER — Other Ambulatory Visit: Payer: Self-pay

## 2020-07-14 ENCOUNTER — Telehealth: Payer: Self-pay

## 2020-07-14 VITALS — BP 128/86 | HR 79 | Temp 96.8°F | Resp 16 | Ht 62.0 in | Wt 185.6 lb

## 2020-07-14 DIAGNOSIS — Z1211 Encounter for screening for malignant neoplasm of colon: Secondary | ICD-10-CM | POA: Diagnosis not present

## 2020-07-14 DIAGNOSIS — Z0000 Encounter for general adult medical examination without abnormal findings: Secondary | ICD-10-CM

## 2020-07-14 DIAGNOSIS — Z23 Encounter for immunization: Secondary | ICD-10-CM

## 2020-07-14 DIAGNOSIS — B009 Herpesviral infection, unspecified: Secondary | ICD-10-CM

## 2020-07-14 MED ORDER — VALACYCLOVIR HCL 1 G PO TABS: ORAL_TABLET | ORAL | 2 refills | Status: DC

## 2020-07-14 NOTE — Addendum Note (Signed)
Addended by: Wilfred Lacy L on: 07/14/2020 02:20 PM   Modules accepted: Orders

## 2020-07-14 NOTE — Telephone Encounter (Signed)
Patient is requesting a refill of Valtrex. She states she usually takes 1/2 tab TID for 7 days. She has scheduled an app to see Dr. Ethelene Hal in March.

## 2020-07-14 NOTE — Patient Instructions (Signed)
Terri [name] , Thank you for taking time to come for your Medicare Wellness Visit. I appreciate your ongoing commitment to your health goals. Please review the following plan we discussed and let me know if I can assist you in the future.   Screening recommendations/referrals: Colonoscopy: Ordered today. Someone will be calling you to schedule. Mammogram: Completed 06/27/2020-Due 06/27/2021 Bone Density: Declined today Recommended yearly ophthalmology/optometry visit for glaucoma screening and checkup Recommended yearly dental visit for hygiene and checkup  Vaccinations: Influenza vaccine: Up to date Pneumococcal vaccine: Completed today Tdap vaccine: Discuss with pharmacy Shingles vaccine: Discuss with pharmacy   Covid-19:Completed vaccines  Advanced directives: Declined information today.  Conditions/risks identified: See problem list  Next appointment: Follow up in one year for your annual wellness visit    Preventive Care 65 Years and Older, Female Preventive care refers to lifestyle choices and visits with your health care provider that can promote health and wellness. What does preventive care include?  A yearly physical exam. This is also called an annual well check.  Dental exams once or twice a year.  Routine eye exams. Ask your health care provider how often you should have your eyes checked.  Personal lifestyle choices, including:  Daily care of your teeth and gums.  Regular physical activity.  Eating a healthy diet.  Avoiding tobacco and drug use.  Limiting alcohol use.  Practicing safe sex.  Taking low-dose aspirin every day.  Taking vitamin and mineral supplements as recommended by your health care provider. What happens during an annual well check? The services and screenings done by your health care provider during your annual well check will depend on your age, overall health, lifestyle risk factors, and family history of disease. Counseling  Your  health care provider may ask you questions about your:  Alcohol use.  Tobacco use.  Drug use.  Emotional well-being.  Home and relationship well-being.  Sexual activity.  Eating habits.  History of falls.  Memory and ability to understand (cognition).  Work and work Statistician.  Reproductive health. Screening  You may have the following tests or measurements:  Height, weight, and BMI.  Blood pressure.  Lipid and cholesterol levels. These may be checked every 5 years, or more frequently if you are over 22 years old.  Skin check.  Lung cancer screening. You may have this screening every year starting at age 74 if you have a 30-pack-year history of smoking and currently smoke or have quit within the past 15 years.  Fecal occult blood test (FOBT) of the stool. You may have this test every year starting at age 74.  Flexible sigmoidoscopy or colonoscopy. You may have a sigmoidoscopy every 5 years or a colonoscopy every 10 years starting at age 55.  Hepatitis C blood test.  Hepatitis B blood test.  Sexually transmitted disease (STD) testing.  Diabetes screening. This is done by checking your blood sugar (glucose) after you have not eaten for a while (fasting). You may have this done every 1-3 years.  Bone density scan. This is done to screen for osteoporosis. You may have this done starting at age 74.  Mammogram. This may be done every 1-2 years. Talk to your health care provider about how often you should have regular mammograms. Talk with your health care provider about your test results, treatment options, and if necessary, the need for more tests. Vaccines  Your health care provider may recommend certain vaccines, such as:  Influenza vaccine. This is recommended every year.  Tetanus, diphtheria, and acellular pertussis (Tdap, Td) vaccine. You may need a Td booster every 10 years.  Zoster vaccine. You may need this after age 74.  Pneumococcal 13-valent  conjugate (PCV13) vaccine. One dose is recommended after age 74.  Pneumococcal polysaccharide (PPSV23) vaccine. One dose is recommended after age 74. Talk to your health care provider about which screenings and vaccines you need and how often you need them. This information is not intended to replace advice given to you by your health care provider. Make sure you discuss any questions you have with your health care provider. Document Released: 06/19/2015 Document Revised: 02/10/2016 Document Reviewed: 03/24/2015 Elsevier Interactive Patient Education  2017 [employer] Prevention in the Home Falls can cause injuries. They can happen to people of all ages. There are many things you can do to make your home safe and to help prevent falls. What can I do on the outside of my home?  Regularly fix the edges of walkways and driveways and fix any cracks.  Remove anything that might make you trip as you walk through a door, such as a raised step or threshold.  Trim any bushes or trees on the path to your home.  Use bright outdoor lighting.  Clear any walking paths of anything that might make someone trip, such as rocks or tools.  Regularly check to see if handrails are loose or broken. Make sure that both sides of any steps have handrails.  Any raised decks and porches should have guardrails on the edges.  Have any leaves, snow, or ice cleared regularly.  Use sand or salt on walking paths during winter.  Clean up any spills in your garage right away. This includes oil or grease spills. What can I do in the bathroom?  Use night lights.  Install grab bars by the toilet and in the tub and shower. Do not use towel bars as grab bars.  Use non-skid mats or decals in the tub or shower.  If you need to sit down in the shower, use a plastic, non-slip stool.  Keep the floor dry. Clean up any water that spills on the floor as soon as it happens.  Remove soap buildup in the tub or  shower regularly.  Attach bath mats securely with double-sided non-slip rug tape.  Do not have throw rugs and other things on the floor that can make you trip. What can I do in the bedroom?  Use night lights.  Make sure that you have a light by your bed that is easy to reach.  Do not use any sheets or blankets that are too big for your bed. They should not hang down onto the floor.  Have a firm chair that has side arms. You can use this for support while you get dressed.  Do not have throw rugs and other things on the floor that can make you trip. What can I do in the kitchen?  Clean up any spills right away.  Avoid walking on wet floors.  Keep items that you use a lot in easy-to-reach places.  If you need to reach something above you, use a strong step stool that has a grab bar.  Keep electrical cords out of the way.  Do not use floor polish or wax that makes floors slippery. If you must use wax, use non-skid floor wax.  Do not have throw rugs and other things on the floor that can make you trip. What can I do  with my stairs?  Do not leave any items on the stairs.  Make sure that there are handrails on both sides of the stairs and use them. Fix handrails that are broken or loose. Make sure that handrails are as long as the stairways.  Check any carpeting to make sure that it is firmly attached to the stairs. Fix any carpet that is loose or worn.  Avoid having throw rugs at the top or bottom of the stairs. If you do have throw rugs, attach them to the floor with carpet tape.  Make sure that you have a light switch at the top of the stairs and the bottom of the stairs. If you do not have them, ask someone to add them for you. What else can I do to help prevent falls?  Wear shoes that:  Do not have high heels.  Have rubber bottoms.  Are comfortable and fit you well.  Are closed at the toe. Do not wear sandals.  If you use a stepladder:  Make sure that it is fully  opened. Do not climb a closed stepladder.  Make sure that both sides of the stepladder are locked into place.  Ask someone to hold it for you, if possible.  Clearly mark and make sure that you can see:  Any grab bars or handrails.  First and last steps.  Where the edge of each step is.  Use tools that help you move around (mobility aids) if they are needed. These include:  Canes.  Walkers.  Scooters.  Crutches.  Turn on the lights when you go into a dark area. Replace any light bulbs as soon as they burn out.  Set up your furniture so you have a clear path. Avoid moving your furniture around.  If any of your floors are uneven, fix them.  If there are any pets around you, be aware of where they are.  Review your medicines with your doctor. Some medicines can make you feel dizzy. This can increase your chance of falling. Ask your doctor what other things that you can do to help prevent falls. This information is not intended to replace advice given to you by your health care provider. Make sure you discuss any questions you have with your health care provider. Document Released: 03/19/2009 Document Revised: 10/29/2015 Document Reviewed: 06/27/2014 Elsevier Interactive Patient Education  2017 [employer].

## 2020-07-24 ENCOUNTER — Encounter: Payer: Self-pay | Admitting: Orthopaedic Surgery

## 2020-07-24 ENCOUNTER — Ambulatory Visit: Payer: Self-pay

## 2020-07-24 ENCOUNTER — Ambulatory Visit (INDEPENDENT_AMBULATORY_CARE_PROVIDER_SITE_OTHER): Payer: Medicare Other | Admitting: Orthopaedic Surgery

## 2020-07-24 VITALS — BP 198/77 | Ht 62.5 in | Wt 183.0 lb

## 2020-07-24 DIAGNOSIS — M545 Low back pain, unspecified: Secondary | ICD-10-CM | POA: Diagnosis not present

## 2020-07-24 DIAGNOSIS — G8929 Other chronic pain: Secondary | ICD-10-CM

## 2020-07-24 DIAGNOSIS — Z9889 Other specified postprocedural states: Secondary | ICD-10-CM

## 2020-07-24 MED ORDER — PREDNISONE 10 MG (21) PO TBPK: ORAL_TABLET | ORAL | 0 refills | Status: DC

## 2020-07-24 NOTE — Progress Notes (Signed)
Office Visit Note   Patient: Terri [name]           Date of Birth: 06-26-1945           MRN: [id number] Visit Date: 07/24/2020              Requested by: Libby Maw, MD [address] [place],  [place] [address] PCP: Libby Maw, MD   Assessment & Plan: Visit Diagnoses:  1. Chronic left-sided low back pain, unspecified whether sciatica present     Plan: Patient likely has some disc bulge on the left.  L4-5 level is most likely since it had previous surgery on the opposite right side.  We will place her on prednisone Dosepak 6, 5, 4, 3, 2, 1 with food.  We will check her back again in 4 weeks.  If she does not get relief we will consider MRI imaging since her pain was severe and she could not walk last weekend.  The last 5 days fortunately her ambulation is significantly improved.  Follow-Up Instructions: Return in about 4 weeks (around 08/21/2020).   Orders:  Orders Placed This Encounter  Procedures  . XR Lumbar Spine 2-3 Views   Meds ordered this encounter  Medications  . predniSONE (STERAPRED UNI-PAK 21 TAB) 10 MG (21) TBPK tablet    Sig: Take 6 tablets the first day with food then one less tablet each day, 5,4,3,2,1 then stop.    Dispense:  21 tablet    Refill:  0      Procedures: No procedures performed   Clinical Data: No additional findings.   Subjective: Chief Complaint  Patient presents with  . Lower Back - Pain    HPI 75 year old female returns she had previous right L4-5 hemilaminectomy decompression microdiscectomy in 2016.  She did great until last Sunday when she started having significant pain.  She noticed gradually over the last 4 to 6 months she was getting some pain that was coming and going but last Sunday was severe and she states she could barely walk could not sleep pain shot down to her foot was burning in of the foot.  She had pain over the bottom of the calcaneus.  She states she is gotten about 50% better in the  last 5 days.  All her pain is been in the left side opposite from her previous surgery.  Review of Systems all other systems are noncontributory negative for diabetes.   Objective: Vital Signs: BP (!) 198/77   Ht 5' 2.5" (1.588 m)   Wt 183 lb (83 kg)   BMI 32.94 kg/m   Physical Exam Constitutional:      Appearance: She is well-developed.  HENT:     Head: Normocephalic.     Right Ear: External ear normal.     Left Ear: External ear normal.  Eyes:     Pupils: Pupils are equal, round, and reactive to light.  Neck:     Thyroid: No thyromegaly.     Trachea: No tracheal deviation.  Cardiovascular:     Rate and Rhythm: Normal rate.  Pulmonary:     Effort: Pulmonary effort is normal.  Abdominal:     Palpations: Abdomen is soft.  Skin:    General: Skin is warm and dry.  Neurological:     Mental Status: She is alert and oriented to person, place, and time.  Psychiatric:        Mood and Affect: Mood and affect normal.  Behavior: Behavior normal.     Ortho Exam well-healed lumbar incision no sciatic notch tenderness.  Trochanteric bursa's are minimally tender.  Reflexes are intact.  Anterior tib EHL peroneals gastrocsoleus are strong symmetrical no atrophy.  Negative straight leg raising to 90 degrees both right and left.  Specialty Comments:  No specialty comments available.  Imaging: No results found.   PMFS History: Patient Active Problem List   Diagnosis Date Noted  . Herpes virus disease 06/27/2019  . Rash 06/26/2019  . Thyroid mass 01/31/2019  . Need for influenza vaccination 01/31/2019  . Lower respiratory infection 08/17/2018  . Presbycusis of right ear 03/29/2018  . PVD (peripheral vascular disease) ([place]) 03/29/2018  . Leg pain, anterior, left 02/27/2018  . Groin pain, chronic, left 02/27/2018  . Seborrheic keratoses 02/27/2018  . Acute maxillary sinusitis 10/26/2017  . Seasonal allergic rhinitis due to pollen 10/11/2017  . Acute bronchitis with COPD  ([place]) 10/11/2017  . Essential hypertension 10/11/2017  . Pulmonary emphysema ([place]) 10/11/2017  . Cough 10/11/2017  . Dysfunction of right eustachian tube 10/11/2017  . Ischemic cardiomyopathy 10/04/2017  . Screen for colon cancer 09/11/2017  . Leg cramps 09/11/2017  . History of pneumonia 09/11/2017  . History of abnormal mammogram 09/11/2017  . Need for 23-polyvalent pneumococcal polysaccharide vaccine 09/11/2017  . Estrogen deficiency 09/11/2017  . Tobacco use 10/04/2016  . Coronary artery disease involving native heart 04/28/2015  . S/P lumbar discectomy 07/25/2014  . Hyperlipidemia 01/16/2013  . Chest pain 01/10/2013  . Family history of heart disease 01/10/2013   Past Medical History:  Diagnosis Date  . Aortic insufficiency    a. Mild AI by echo 2014.  . Arthritis   . CAD (coronary artery disease)    a. Anterior STEMI 01/2015 s/p overlapping DES x2 to prox LAD, DES to prox RCA in [facility].  . Chronic back pain    stenosis/HNP  . History of bronchitis 2014  . History of colon polyps   . Hyperlipidemia   . Ischemic cardiomyopathy   . Joint pain   . Joint swelling   . Pneumonia 2014  . Smokers' cough ([place])   . Stress headaches   . Tobacco abuse     Family History  Problem Relation Age of Onset  . Heart disease Mother   . Heart failure Mother   . Heart disease Father   . Heart failure Father   . CAD Other        both parents and multiple siblings with either died or had-related issues  . Breast cancer Sister     Past Surgical History:  Procedure Laterality Date  . ABDOMINAL HYSTERECTOMY     at age 85  . BREAST BIOPSY Right    roughly 30 years ago with [facility]   . cataract surgery Bilateral   . CHOLECYSTECTOMY    . colonscopy    . ERCP    . ESOPHAGOGASTRODUODENOSCOPY    . LUMBAR LAMINECTOMY Right 07/25/2014   Procedure: Right L4 Hemilaminectomy, Right L4-5 Laminotomy , Decompression, Microdisectomy;  Surgeon: Marybelle Killings, MD;  Location: [facility];  Service: Orthopedics;  Laterality: Right;  . SHOULDER SURGERY Right   . TONSILLECTOMY     as a child   Social History   Occupational History  . Not on file  Tobacco Use  . Smoking status: Current Every Day Smoker    Packs/day: 0.50    Years: 50.00    Pack years: 25.00    Types:  Cigarettes  . Smokeless tobacco: Never Used  Substance and Sexual Activity  . Alcohol use: No  . Drug use: No  . Sexual activity: Yes    Birth control/protection: Surgical

## 2020-07-31 ENCOUNTER — Other Ambulatory Visit: Payer: Self-pay

## 2020-07-31 ENCOUNTER — Encounter: Payer: Self-pay | Admitting: Cardiovascular Disease

## 2020-07-31 ENCOUNTER — Ambulatory Visit (INDEPENDENT_AMBULATORY_CARE_PROVIDER_SITE_OTHER): Payer: Medicare Other | Admitting: Cardiovascular Disease

## 2020-07-31 VITALS — BP 177/72 | HR 74 | Ht 62.5 in | Wt 186.2 lb

## 2020-07-31 DIAGNOSIS — I2510 Atherosclerotic heart disease of native coronary artery without angina pectoris: Secondary | ICD-10-CM

## 2020-07-31 DIAGNOSIS — E782 Mixed hyperlipidemia: Secondary | ICD-10-CM

## 2020-07-31 DIAGNOSIS — I255 Ischemic cardiomyopathy: Secondary | ICD-10-CM | POA: Diagnosis not present

## 2020-07-31 DIAGNOSIS — Z720 Tobacco use: Secondary | ICD-10-CM | POA: Diagnosis not present

## 2020-07-31 DIAGNOSIS — I10 Essential (primary) hypertension: Secondary | ICD-10-CM

## 2020-07-31 MED ORDER — CARVEDILOL 6.25 MG PO TABS: ORAL_TABLET | ORAL | 3 refills | Status: DC

## 2020-07-31 MED ORDER — CHLORTHALIDONE 25 MG PO TABS: 12.5000 mg | ORAL_TABLET | Freq: Every day | ORAL | 3 refills | Status: DC

## 2020-07-31 MED ORDER — CLOPIDOGREL BISULFATE 75 MG PO TABS: 75.0000 mg | ORAL_TABLET | Freq: Every day | ORAL | 3 refills | Status: DC

## 2020-07-31 MED ORDER — LISINOPRIL 20 MG PO TABS: 20.0000 mg | ORAL_TABLET | Freq: Every day | ORAL | 3 refills | Status: DC

## 2020-07-31 MED ORDER — ATORVASTATIN CALCIUM 80 MG PO TABS: 80.0000 mg | ORAL_TABLET | Freq: Every day | ORAL | 3 refills | Status: DC

## 2020-07-31 NOTE — Assessment & Plan Note (Signed)
History of CAD status post anterior STEMI 01/31/2015 while in [place]. She was taken to [facility] and found to have an occluded proximal LAD, occluded marginal branch and high-grade proximal RCA stenosis. She was cath radially by Dr. Cecilio Asper. She had a Synergy drug-eluting stent placed in an overlapping fashion to the proximal LAD and proximal and her proximal RCA. Her EF at that time was 25% which clinically improved up to 40 to 45% by 2D echo 03/03/2015 with an anteroapical wall motion normality. She is asymptomatic.

## 2020-07-31 NOTE — Progress Notes (Signed)
07/31/2020 Terri [name]   [birth year]  [id number]  Primary Physician Libby Maw, MD Primary Cardiologist: Lorretta Harp MD Lupe Carney, [place]  HPI:  Terri [name] is a 75 y.o.  mildly overweight married Caucasian female mother of 2 children whose husband [name] is also a patient of mine. I last saw her in the office 01/22/2019. Her only cardiac risk factors include 25 pack years of tobacco abuse currently smoking one half pack per day. She has actually strong family history heart disease with both parents and multiple siblings with either died or had-related issues. She is retired Insurance claims handler. Since I saw her in the office over 2 years ago she has suffered an anterior wall myocardial infarction on 01/31/15 while at [facility]. She was taken to [facility] where she was found to have an occluded proximal LAD, occluded marginal branch and high-grade proximal RCA stenosis. She was cathed radially by Dr. Cecilio Asper. She had to synergy drug-eluting stents placed in an overlapping fashion in theproximal LAD and her proximal RCA. Her EF at that time was 25% which is felt clinically improved to 40-45% by 2-D echo 03/03/15 with an anteroapical wall motion abnormality.  She continues to smoke 1/2 pack/day.  Since I saw her in the office a year and a half ago she continues to do well. She does continue to smoke 1/2 pack/day recalcitrant to risk factor modification. She denies chest pain or shortness of breath. She currently has a bulging disc and recently finished a course of prednisone therapy probably contributing to her hypertension today.      Allergies  Allergen Reactions  . Erythromycin     nauseated    Social History   Socioeconomic History  . Marital status: Married    Spouse name: Not on file  . Number of children: Not on file  . Years of education: Not on file  . Highest education level: Not on file  Occupational History  . Not  on file  Tobacco Use  . Smoking status: Current Every Day Smoker    Packs/day: 0.50    Years: 50.00    Pack years: 25.00    Types: Cigarettes  . Smokeless tobacco: Never Used  Substance and Sexual Activity  . Alcohol use: No  . Drug use: No  . Sexual activity: Yes    Birth control/protection: Surgical  Other Topics Concern  . Not on file  Social History Narrative  . Not on file   Social Determinants of Health   Financial Resource Strain: Low Risk   . Difficulty of Paying Living Expenses: Not hard at all  Food Insecurity: No Food Insecurity  . Worried About Charity fundraiser in the Last Year: Never true  . Ran Out of Food in the Last Year: Never true  Transportation Needs: No Transportation Needs  . Lack of Transportation (Medical): No  . Lack of Transportation (Non-Medical): No  Physical Activity: Inactive  . Days of Exercise per Week: 0 days  . Minutes of Exercise per Session: 0 min  Stress: No Stress Concern Present  . Feeling of Stress : Only a little  Social Connections: Moderately Integrated  . Frequency of Communication with Friends and Family: More than three times a week  . Frequency of Social Gatherings with Friends and Family: More than three times a week  . Attends Religious Services: 1 to 4 times per year  . Active Member of Clubs or  Organizations: No  . Attends Archivist Meetings: Never  . Marital Status: Married  Human resources officer Violence: Not At Risk  . Fear of Current or Ex-Partner: No  . Emotionally Abused: No  . Physically Abused: No  . Sexually Abused: No     Review of Systems: General: negative for chills, fever, night sweats or weight changes.  Cardiovascular: negative for chest pain, dyspnea on exertion, edema, orthopnea, palpitations, paroxysmal nocturnal dyspnea or shortness of breath Dermatological: negative for rash Respiratory: negative for cough or wheezing Urologic: negative for hematuria Abdominal: negative for nausea,  vomiting, diarrhea, bright red blood per rectum, melena, or hematemesis Neurologic: negative for visual changes, syncope, or dizziness All other systems reviewed and are otherwise negative except as noted above.    Blood pressure (!) 177/72, pulse 74, height 5' 2.5" (1.588 m), weight 186 lb 3.2 oz (84.5 kg), SpO2 98 %.  General appearance: alert and no distress Neck: no adenopathy, no carotid bruit, no JVD, supple, symmetrical, trachea midline and thyroid not enlarged, symmetric, no tenderness/mass/nodules Lungs: clear to auscultation bilaterally Heart: regular rate and rhythm, S1, S2 normal, no murmur, click, rub or gallop Extremities: extremities normal, atraumatic, no cyanosis or edema Pulses: 2+ and symmetric Skin: Skin color, texture, turgor normal. No rashes or lesions Neurologic: Alert and oriented X 3, normal strength and tone. Normal symmetric reflexes. Normal coordination and gait  EKG sinus rhythm at 74 with septal Q waves and left axis deviation. There is poor R wave progression. I personally reviewed this EKG.  ASSESSMENT AND PLAN:   Hyperlipidemia History of hyperlipidemia on statin therapy. We will recheck a lipid liver profile.  Coronary artery disease involving native heart History of CAD status post anterior STEMI 01/31/2015 while in [facility]. She was taken to [facility] and found to have an occluded proximal LAD, occluded marginal branch and high-grade proximal RCA stenosis. She was cath radially by Dr. Cecilio Asper. She had a Synergy drug-eluting stent placed in an overlapping fashion to the proximal LAD and proximal and her proximal RCA. Her EF at that time was 25% which clinically improved up to 40 to 45% by 2D echo 03/03/2015 with an anteroapical wall motion normality. She is asymptomatic.  Tobacco use Continue tobacco use 1/2 pack/day recalcitrant to risk factor modification.  Ischemic cardiomyopathy History of ischemic  cardiomyopathy with an EF of 40 to 45% by 2D echo 03/03/2015. She is she is completely asymptomatic from this. She is on lisinopril and carvedilol.  Essential hypertension History of essential hypertension a blood pressure measured today 177/72. She is on low-dose carvedilol and lisinopril. Apparently she has a bulging disc and recently finished prednisone therapy. I am going to increase her carvedilol from 3.125 to 625 mg p.o. twice daily. I will have her keep a blood pressure log for 30 days and have her see a Pharm.D. back in follow-up.      Lorretta Harp MD FACP,FACC,FAHA, [facility] 07/31/2020 11:51 AM

## 2020-07-31 NOTE — Assessment & Plan Note (Signed)
History of ischemic cardiomyopathy with an EF of 40 to 45% by 2D echo 03/03/2015. She is she is completely asymptomatic from this. She is on lisinopril and carvedilol.

## 2020-07-31 NOTE — Assessment & Plan Note (Signed)
History of hyperlipidemia on statin therapy.  We will recheck a lipid liver profile 

## 2020-07-31 NOTE — Addendum Note (Signed)
Addended by: Beatrix Fetters on: 07/31/2020 02:54 PM   Modules accepted: Orders

## 2020-07-31 NOTE — Assessment & Plan Note (Signed)
History of essential hypertension a blood pressure measured today 177/72. She is on low-dose carvedilol and lisinopril. Apparently she has a bulging disc and recently finished prednisone therapy. I am going to increase her carvedilol from 3.125 to 625 mg p.o. twice daily. I will have her keep a blood pressure log for 30 days and have her see a Pharm.D. back in follow-up.

## 2020-07-31 NOTE — Patient Instructions (Signed)
Medication Instructions:   -Increase carvedilol to 6.25 mg twice daily.  *If you need a refill on your cardiac medications before your next appointment, please call your pharmacy*   Lab Work: Your physician recommends that you return for lab work in: 1-2 weeks  If you have labs (blood work) drawn today and your tests are completely normal, you will receive your results only by: Marland Kitchen MyChart Message (if you have MyChart) OR . A paper copy in the mail If you have any lab test that is abnormal or we need to change your treatment, we will call you to review the results.    Follow-Up: At [facility], you and your health needs are our priority.  As part of our continuing mission to provide you with exceptional heart care, we have created designated Provider Care Teams.  These Care Teams include your primary Cardiologist (physician) and Advanced Practice Providers (APPs -  Physician Assistants and Nurse Practitioners) who all work together to provide you with the care you need, when you need it.  We recommend signing up for the patient portal called "MyChart".  Sign up information is provided on this After Visit Summary.  MyChart is used to connect with patients for Virtual Visits (Telemedicine).  Patients are able to view lab/test results, encounter notes, upcoming appointments, etc.  Non-urgent messages can be sent to your provider as well.   To learn more about what you can do with MyChart, go to [email or web address].    Your next appointment:   12 month(s)  The format for your next appointment:   In Person  Provider:   Quay Burow, MD   Other Instructions Please keep and 30 day blood pressure log. Come back in 30 days to see PharmD to check in on blood pressure.

## 2020-07-31 NOTE — Assessment & Plan Note (Signed)
Continue tobacco use 1/2 pack/day recalcitrant to risk factor modification.

## 2020-08-14 DIAGNOSIS — I10 Essential (primary) hypertension: Secondary | ICD-10-CM | POA: Diagnosis not present

## 2020-08-14 DIAGNOSIS — E782 Mixed hyperlipidemia: Secondary | ICD-10-CM | POA: Diagnosis not present

## 2020-08-15 LAB — LIPID PANEL
Chol/HDL Ratio: 3.2 ratio (ref 0.0–4.4)
Cholesterol, Total: 133 mg/dL (ref 100–199)
HDL: 41 mg/dL (ref >39)
LDL Chol Calc (NIH): 63 mg/dL (ref 0–99)
Triglycerides: 170 mg/dL — ABNORMAL HIGH (ref 0–149)
VLDL Cholesterol Cal: 29 mg/dL (ref 5–40)

## 2020-08-15 LAB — HEPATIC FUNCTION PANEL
ALT: 15 IU/L (ref 0–32)
AST: 13 IU/L (ref 0–40)
Albumin: 4.1 g/dL (ref 3.7–4.7)
Alkaline Phosphatase: 132 IU/L — ABNORMAL HIGH (ref 44–121)
Bilirubin Total: 0.3 mg/dL (ref 0.0–1.2)
Bilirubin, Direct: 0.11 mg/dL (ref 0.00–0.40)
Total Protein: 6.2 g/dL (ref 6.0–8.5)

## 2020-08-21 ENCOUNTER — Ambulatory Visit (INDEPENDENT_AMBULATORY_CARE_PROVIDER_SITE_OTHER): Payer: Medicare Other | Admitting: Orthopaedic Surgery

## 2020-08-21 ENCOUNTER — Encounter: Payer: Self-pay | Admitting: Orthopaedic Surgery

## 2020-08-21 ENCOUNTER — Other Ambulatory Visit: Payer: Self-pay

## 2020-08-21 VITALS — BP 191/84 | HR 71

## 2020-08-21 DIAGNOSIS — M79605 Pain in left leg: Secondary | ICD-10-CM

## 2020-08-21 DIAGNOSIS — I255 Ischemic cardiomyopathy: Secondary | ICD-10-CM | POA: Diagnosis not present

## 2020-08-21 DIAGNOSIS — Z9889 Other specified postprocedural states: Secondary | ICD-10-CM | POA: Diagnosis not present

## 2020-08-21 MED ORDER — DIAZEPAM 5 MG PO TABS: ORAL_TABLET | ORAL | 0 refills | Status: DC

## 2020-08-21 NOTE — Progress Notes (Signed)
Office Visit Note   Patient: Terri [name]           Date of Birth: Nov 28, 1945           MRN: [id number] Visit Date: 08/21/2020              Requested by: Libby Maw, MD [address] [place],  [place] [address] PCP: Libby Maw, MD   Assessment & Plan: Visit Diagnoses:  1. Leg pain, anterior, left   2. Hx of microdiscectomy     Plan: We will proceed with MRI with and without contrast for evaluation for back and left leg radicular symptoms S1 distribution.  Office follow-up after MRI scan.  Valium 2 tablets sent in for claustrophobia.  Follow-Up Instructions: No follow-ups on file.   Orders:  No orders of the defined types were placed in this encounter.  No orders of the defined types were placed in this encounter.     Procedures: No procedures performed   Clinical Data: No additional findings.   Subjective: Chief Complaint  Patient presents with  . Lower Back - Pain    HPI 75 year old female returns with ongoing problems with back pain left leg pain radiates down the plantar surface of her foot with numbness and burning.  She gets relief when supine position increased pain the more she is active.  Prednisone pack gave her some temporary relief and then recurrence after she stopped.  Patient states her pain is rated as severe.  Anti-inflammatories and topical rubs have not been effective.  Previous right L4-5 microdiscectomy in February 2016.  MRI scan at that time showed small disc protrusion on the left at L5-S1 and only mild narrowing on the left side at L4-5.  Review of Systems patient is a smoker.  She has some aortic calcification consistent with PAD.  History of ischemic cardiomyopathy COPD hyperlipidemia.  All the systems noncontributory to HPI.   Objective: Vital Signs: BP (!) 191/84   Pulse 71   Physical Exam Constitutional:      Appearance: She is well-developed.  HENT:     Head: Normocephalic.     Right Ear: External  ear normal.     Left Ear: External ear normal.  Eyes:     Pupils: Pupils are equal, round, and reactive to light.  Neck:     Thyroid: No thyromegaly.     Trachea: No tracheal deviation.  Cardiovascular:     Rate and Rhythm: Normal rate.  Pulmonary:     Effort: Pulmonary effort is normal.  Abdominal:     Palpations: Abdomen is soft.  Skin:    General: Skin is warm and dry.  Neurological:     Mental Status: She is alert and oriented to person, place, and time.  Psychiatric:        Behavior: Behavior normal.     Ortho Exam right greater than left trochanteric bursal tenderness left sciatic notch tenderness negative on the right well-healed L4-5 incision few millimeters to the right of midline.  Knee and ankle jerk are intact decreased sensation plantar surface lateral surface of the left foot.  No gastrocsoleus weakness with resisted testing.  She has radicular symptoms on the left with attempted heel walking fatigues with toe walking on the left left calf.  Specialty Comments:  No specialty comments available.  Imaging: No results found.   PMFS History: Patient Active Problem List   Diagnosis Date Noted  . Herpes virus disease 06/27/2019  . Rash  06/26/2019  . Thyroid mass 01/31/2019  . Need for influenza vaccination 01/31/2019  . Lower respiratory infection 08/17/2018  . Presbycusis of right ear 03/29/2018  . PVD (peripheral vascular disease) ([place]) 03/29/2018  . Leg pain, anterior, left 02/27/2018  . Groin pain, chronic, left 02/27/2018  . Seborrheic keratoses 02/27/2018  . Acute maxillary sinusitis 10/26/2017  . Seasonal allergic rhinitis due to pollen 10/11/2017  . Acute bronchitis with COPD ([place]) 10/11/2017  . Essential hypertension 10/11/2017  . Pulmonary emphysema ([place]) 10/11/2017  . Cough 10/11/2017  . Dysfunction of right eustachian tube 10/11/2017  . Ischemic cardiomyopathy 10/04/2017  . Screen for colon cancer 09/11/2017  . Leg cramps 09/11/2017  . History  of pneumonia 09/11/2017  . History of abnormal mammogram 09/11/2017  . Need for 23-polyvalent pneumococcal polysaccharide vaccine 09/11/2017  . Estrogen deficiency 09/11/2017  . Tobacco use 10/04/2016  . Coronary artery disease involving native heart 04/28/2015  . Hx of microdiscectomy 07/25/2014  . Hyperlipidemia 01/16/2013  . Chest pain 01/10/2013  . Family history of heart disease 01/10/2013   Past Medical History:  Diagnosis Date  . Aortic insufficiency    a. Mild AI by echo 2014.  . Arthritis   . CAD (coronary artery disease)    a. Anterior STEMI 01/2015 s/p overlapping DES x2 to prox LAD, DES to prox RCA in [facility].  . Chronic back pain    stenosis/HNP  . History of bronchitis 2014  . History of colon polyps   . Hyperlipidemia   . Ischemic cardiomyopathy   . Joint pain   . Joint swelling   . Pneumonia 2014  . Smokers' cough ([place])   . Stress headaches   . Tobacco abuse     Family History  Problem Relation Age of Onset  . Heart disease Mother   . Heart failure Mother   . Heart disease Father   . Heart failure Father   . CAD Other        both parents and multiple siblings with either died or had-related issues  . Breast cancer Sister     Past Surgical History:  Procedure Laterality Date  . ABDOMINAL HYSTERECTOMY     at age 66  . BREAST BIOPSY Right    roughly 30 years ago with [facility]   . cataract surgery Bilateral   . CHOLECYSTECTOMY    . colonscopy    . ERCP    . ESOPHAGOGASTRODUODENOSCOPY    . LUMBAR LAMINECTOMY Right 07/25/2014   Procedure: Right L4 Hemilaminectomy, Right L4-5 Laminotomy , Decompression, Microdisectomy;  Surgeon: Marybelle Killings, MD;  Location: [facility];  Service: Orthopedics;  Laterality: Right;  . SHOULDER SURGERY Right   . TONSILLECTOMY     as a child   Social History   Occupational History  . Not on file  Tobacco Use  . Smoking status: Current Every Day Smoker    Packs/day: 0.50    Years: 50.00    Pack years: 25.00     Types: Cigarettes  . Smokeless tobacco: Never Used  Substance and Sexual Activity  . Alcohol use: No  . Drug use: No  . Sexual activity: Yes    Birth control/protection: Surgical

## 2020-08-28 ENCOUNTER — Ambulatory Visit: Payer: Medicare Other

## 2020-09-01 ENCOUNTER — Other Ambulatory Visit: Payer: Self-pay

## 2020-09-02 ENCOUNTER — Encounter: Payer: Self-pay | Admitting: Family Medicine

## 2020-09-02 ENCOUNTER — Ambulatory Visit (INDEPENDENT_AMBULATORY_CARE_PROVIDER_SITE_OTHER): Payer: Medicare Other | Admitting: Family Medicine

## 2020-09-02 VITALS — BP 142/70 | HR 70 | Temp 97.1°F | Ht 62.0 in | Wt 186.4 lb

## 2020-09-02 DIAGNOSIS — E079 Disorder of thyroid, unspecified: Secondary | ICD-10-CM | POA: Diagnosis not present

## 2020-09-02 DIAGNOSIS — Z720 Tobacco use: Secondary | ICD-10-CM

## 2020-09-02 DIAGNOSIS — I10 Essential (primary) hypertension: Secondary | ICD-10-CM | POA: Diagnosis not present

## 2020-09-02 NOTE — Progress Notes (Signed)
Established Patient Office Visit  Subjective:  Patient ID: Terri [name], female    DOB: 1946/04/17  Age: 75 y.o. MRN: [id number]  CC:  Chief Complaint  Patient presents with  . Follow-up    Follow up on thyroid discuss [place] of thyroid. No concerns.     HPI Terri [name] presents for follow-up of right lower lobe thyroid nodule.  Patient denies any tightness in her throat difficulty breathing or noticeable mass.  Blood pressure normally runs in the 120s over 70s.  She is currently experiencing sciatica and lower back pain.  An MRI has been recently scheduled by her orthopedist.  Has not been able to go for her follow-up colonoscopy secondary to her back issues.  Past Medical History:  Diagnosis Date  . Aortic insufficiency    a. Mild AI by echo 2014.  . Arthritis   . CAD (coronary artery disease)    a. Anterior STEMI 01/2015 s/p overlapping DES x2 to prox LAD, DES to prox RCA in [facility].  . Chronic back pain    stenosis/HNP  . History of bronchitis 2014  . History of colon polyps   . Hyperlipidemia   . Ischemic cardiomyopathy   . Joint pain   . Joint swelling   . Pneumonia 2014  . Smokers' cough ([place])   . Stress headaches   . Tobacco abuse     Past Surgical History:  Procedure Laterality Date  . ABDOMINAL HYSTERECTOMY     at age 54  . BREAST BIOPSY Right    roughly 30 years ago with [facility]   . cataract surgery Bilateral   . CHOLECYSTECTOMY    . colonscopy    . ERCP    . ESOPHAGOGASTRODUODENOSCOPY    . LUMBAR LAMINECTOMY Right 07/25/2014   Procedure: Right L4 Hemilaminectomy, Right L4-5 Laminotomy , Decompression, Microdisectomy;  Surgeon: Marybelle Killings, MD;  Location: [place];  Service: Orthopedics;  Laterality: Right;  . SHOULDER SURGERY Right   . TONSILLECTOMY     as a child    Family History  Problem Relation Age of Onset  . Heart disease Mother   . Heart failure Mother   . Heart disease Father   . Heart failure Father   . CAD Other         both parents and multiple siblings with either died or had-related issues  . Breast cancer Sister     Social History   Socioeconomic History  . Marital status: Married    Spouse name: Not on file  . Number of children: Not on file  . Years of education: Not on file  . Highest education level: Not on file  Occupational History  . Not on file  Tobacco Use  . Smoking status: Current Every Day Smoker    Packs/day: 0.50    Years: 50.00    Pack years: 25.00    Types: Cigarettes  . Smokeless tobacco: Never Used  Substance and Sexual Activity  . Alcohol use: No  . Drug use: No  . Sexual activity: Yes    Birth control/protection: Surgical  Other Topics Concern  . Not on file  Social History Narrative  . Not on file   Social Determinants of Health   Financial Resource Strain: Low Risk   . Difficulty of Paying Living Expenses: Not hard at all  Food Insecurity: No Food Insecurity  . Worried About Charity fundraiser in the Last Year: Never true  . Ran  Out of Food in the Last Year: Never true  Transportation Needs: No Transportation Needs  . Lack of Transportation (Medical): No  . Lack of Transportation (Non-Medical): No  Physical Activity: Inactive  . Days of Exercise per Week: 0 days  . Minutes of Exercise per Session: 0 min  Stress: No Stress Concern Present  . Feeling of Stress : Only a little  Social Connections: Moderately Integrated  . Frequency of Communication with Friends and Family: More than three times a week  . Frequency of Social Gatherings with Friends and Family: More than three times a week  . Attends Religious Services: 1 to 4 times per year  . Active Member of Clubs or Organizations: No  . Attends Archivist Meetings: Never  . Marital Status: Married  Human resources officer Violence: Not At Risk  . Fear of Current or Ex-Partner: No  . Emotionally Abused: No  . Physically Abused: No  . Sexually Abused: No    Outpatient Medications Prior to Visit   Medication Sig Dispense Refill  . albuterol (PROVENTIL HFA;VENTOLIN HFA) 108 (90 Base) MCG/ACT inhaler Inhale 1-2 puffs into the lungs every 6 (six) hours as needed for wheezing or shortness of breath. And or cough. 1 Inhaler 0  . atorvastatin (LIPITOR) 80 MG tablet Take 1 tablet (80 mg total) by mouth daily. 90 tablet 3  . carvedilol (COREG) 6.25 MG tablet TAKE 1 TABLET BY MOUTH TWICE DAILY 180 tablet 3  . chlorthalidone (HYGROTON) 25 MG tablet Take 0.5 tablets (12.5 mg total) by mouth daily. 45 tablet 3  . clopidogrel (PLAVIX) 75 MG tablet Take 1 tablet (75 mg total) by mouth daily. 90 tablet 3  . [facility] ASPIRIN 81 MG EC tablet Take 81 mg by mouth daily.  0  . lisinopril (ZESTRIL) 20 MG tablet Take 1 tablet (20 mg total) by mouth daily. 90 tablet 3  . valACYclovir (VALTREX) 1000 MG tablet Take 1/2 tablet three times daily for 7 days. 7 tablet 2  . diazepam (VALIUM) 5 MG tablet TAKE 2 TABs PO 30 MINS BEFORE MRI (Patient not taking: Reported on 09/02/2020) 2 tablet 0  . predniSONE (STERAPRED UNI-PAK 21 TAB) 10 MG (21) TBPK tablet Take 6 tablets the first day with food then one less tablet each day, 5,4,3,2,1 then stop. (Patient not taking: Reported on 09/02/2020) 21 tablet 0   No facility-administered medications prior to visit.    Allergies  Allergen Reactions  . Erythromycin     nauseated    ROS Review of Systems  Constitutional: Negative.   Eyes: Negative for photophobia and visual disturbance.  Respiratory: Negative.   Cardiovascular: Negative for chest pain.  Gastrointestinal: Negative.   Endocrine: Negative for cold intolerance and heat intolerance.  Genitourinary: Negative.   Musculoskeletal: Positive for back pain.  Neurological: Negative for headaches.      Objective:    Physical Exam Vitals and nursing note reviewed.  Constitutional:      Appearance: Normal appearance.  HENT:     Head: Normocephalic and atraumatic.     Right Ear: Tympanic membrane, ear canal and  external ear normal.     Left Ear: Tympanic membrane, ear canal and external ear normal.     Mouth/Throat:     Mouth: Mucous membranes are moist.     Pharynx: Oropharynx is clear. No oropharyngeal exudate or posterior oropharyngeal erythema.  Eyes:     Extraocular Movements: Extraocular movements intact.     Conjunctiva/sclera: Conjunctivae normal.  Pupils: Pupils are equal, round, and reactive to light.  Neck:     Thyroid: No thyromegaly or thyroid tenderness.  Cardiovascular:     Rate and Rhythm: Normal rate and regular rhythm.  Pulmonary:     Effort: Pulmonary effort is normal.  Abdominal:     General: Bowel sounds are normal.  Musculoskeletal:     Cervical back: No rigidity or tenderness.     Right lower leg: No edema.     Left lower leg: No edema.  Lymphadenopathy:     Cervical: No cervical adenopathy.  Skin:    General: Skin is warm and dry.  Neurological:     Mental Status: She is alert and oriented to person, place, and time.  Psychiatric:        Mood and Affect: Mood normal.        Behavior: Behavior normal.     BP (!) 142/70   Pulse 70   Temp (!) 97.1 F (36.2 C) (Temporal)   Ht 5\' 2"  (1.575 m)   Wt 186 lb 6.4 oz (84.6 kg)   SpO2 96%   BMI 34.09 kg/m  Wt Readings from Last 3 Encounters:  09/02/20 186 lb 6.4 oz (84.6 kg)  07/31/20 186 lb 3.2 oz (84.5 kg)  07/24/20 183 lb (83 kg)     Health Maintenance Due  Topic Date Due  . Hepatitis C Screening  Never done  . TETANUS/TDAP  Never done  . COLONOSCOPY (Pts 45-98yrs Insurance coverage will need to be confirmed)  Never done    There are no preventive care reminders to display for this patient.  Lab Results  Component Value Date   TSH 1.75 01/31/2019   Lab Results  Component Value Date   WBC 10.5 08/25/2017   HGB 13.1 08/25/2017   HCT 39.8 08/25/2017   MCV 88.4 08/25/2017   PLT 230 08/25/2017   Lab Results  Component Value Date   NA 139 02/07/2019   K 4.1 02/07/2019   CO2 19 (L)  02/07/2019   GLUCOSE 79 02/07/2019   BUN 18 02/07/2019   CREATININE 0.83 02/07/2019   BILITOT 0.3 08/14/2020   ALKPHOS 132 (H) 08/14/2020   AST 13 08/14/2020   ALT 15 08/14/2020   PROT 6.2 08/14/2020   ALBUMIN 4.1 08/14/2020   CALCIUM 8.8 02/07/2019   ANIONGAP 9 08/25/2017   GFR 67.25 03/07/2018   Lab Results  Component Value Date   CHOL 133 08/14/2020   Lab Results  Component Value Date   HDL 41 08/14/2020   Lab Results  Component Value Date   LDLCALC 63 08/14/2020   Lab Results  Component Value Date   TRIG 170 (H) 08/14/2020   Lab Results  Component Value Date   CHOLHDL 3.2 08/14/2020   Lab Results  Component Value Date   HGBA1C 6.0 02/27/2015      Assessment & Plan:   Problem List Items Addressed This Visit      Cardiovascular and Mediastinum   Essential hypertension     Other   Tobacco use   Thyroid mass - Primary   Relevant Orders   US THYROID      No orders of the defined types were placed in this encounter.   Follow-up: Return in about 6 months (around 03/05/2021), or Return in 1 month if blood pressure does not normalize..   Scheduled for follow-up thyroid ultrasound.  Again discussed quitting smoking.  And how much that would help if she wants of needing  another back surgery.  Follow-up here or with cardiology if blood pressure does does not go back to her normal when things calm down for her.  Libby Maw, MD

## 2020-09-12 ENCOUNTER — Other Ambulatory Visit: Payer: Self-pay

## 2020-09-12 ENCOUNTER — Other Ambulatory Visit: Payer: Medicare Other

## 2020-09-12 ENCOUNTER — Ambulatory Visit
Admission: RE | Admit: 2020-09-12 | Discharge: 2020-09-12 | Disposition: A | Discharge status: Home / Self Care | Payer: Medicare Other | Source: Ambulatory Visit | Attending: Orthopaedic Surgery | Admitting: Orthopaedic Surgery

## 2020-09-12 DIAGNOSIS — M79605 Pain in left leg: Secondary | ICD-10-CM

## 2020-09-12 DIAGNOSIS — Z9889 Other specified postprocedural states: Secondary | ICD-10-CM

## 2020-09-12 DIAGNOSIS — M4807 Spinal stenosis, lumbosacral region: Secondary | ICD-10-CM | POA: Diagnosis not present

## 2020-09-12 DIAGNOSIS — M48061 Spinal stenosis, lumbar region without neurogenic claudication: Secondary | ICD-10-CM | POA: Diagnosis not present

## 2020-09-12 DIAGNOSIS — M5136 Other intervertebral disc degeneration, lumbar region: Secondary | ICD-10-CM | POA: Diagnosis not present

## 2020-09-12 DIAGNOSIS — M5126 Other intervertebral disc displacement, lumbar region: Secondary | ICD-10-CM | POA: Diagnosis not present

## 2020-09-12 MED ORDER — GADOBENATE DIMEGLUMINE 529 MG/ML IV SOLN
17.0000 mL | Freq: Once | INTRAVENOUS | Status: AC | PRN
  Administered 2020-09-12: 17 mL via INTRAVENOUS

## 2020-09-16 ENCOUNTER — Other Ambulatory Visit: Payer: Self-pay

## 2020-09-16 ENCOUNTER — Encounter: Payer: Self-pay | Admitting: Orthopaedic Surgery

## 2020-09-16 ENCOUNTER — Ambulatory Visit (INDEPENDENT_AMBULATORY_CARE_PROVIDER_SITE_OTHER): Payer: Medicare Other | Admitting: Orthopaedic Surgery

## 2020-09-16 VITALS — BP 170/83 | HR 66 | Ht 62.0 in | Wt 186.0 lb

## 2020-09-16 DIAGNOSIS — I255 Ischemic cardiomyopathy: Secondary | ICD-10-CM | POA: Diagnosis not present

## 2020-09-16 DIAGNOSIS — M545 Low back pain, unspecified: Secondary | ICD-10-CM | POA: Diagnosis not present

## 2020-09-16 DIAGNOSIS — G8929 Other chronic pain: Secondary | ICD-10-CM | POA: Diagnosis not present

## 2020-09-16 MED ORDER — GABAPENTIN 100 MG PO CAPS: ORAL_CAPSULE | ORAL | 1 refills | Status: DC

## 2020-09-16 NOTE — Progress Notes (Signed)
Office Visit Note   Patient: Terri [name]           Date of Birth: Nov 20, 1945           MRN: [id number] Visit Date: 09/16/2020              Requested by: Libby Maw, MD [address] [place],  [place] [id number] PCP: Libby Maw, MD   Assessment & Plan: Visit Diagnoses:  1. Chronic left-sided low back pain, unspecified whether sciatica present     Plan: MRI scan is reviewed with her.  She has moderate stenosis at L3-4 left side disc protrusion.  Severe subarticular and foraminal stenosis at L4-5 on the left with disc bulging and spurring.  Severe articular stenosis on the left at L5-S1.  We will set up for epidural injections on the left side with Dr. Ernestina Patches.  Office follow-up after scan for review we discussed possible surgery if she does not respond to maximal conservative treatment.  Patient states she is miserable and needs to proceed with something that would give her some relief.  We will start some Neurontin 100 mg at night x5 days then 200 mg at night.  Follow-Up Instructions: Return in about 6 weeks (around 10/28/2020).   Orders:  Orders Placed This Encounter  Procedures  . Ambulatory referral to Physical Medicine Rehab   Meds ordered this encounter  Medications  . gabapentin (NEURONTIN) 100 MG capsule    Sig: One at night times 5 days then 2 po q HS    Dispense:  60 capsule    Refill:  1      Procedures: No procedures performed   Clinical Data: No additional findings.   Subjective: Chief Complaint  Patient presents with  . Lower Back - Pain    HPI 75 year old female here with ongoing back pain left leg pain that radiates down the plantar surface of her foot.  Previous microdiscectomy hemilaminectomy on the right at L4-11 July 2014.  Greater than 73-month history of progressive back and left leg symptoms now.  New MRI has been obtained and is available for review.  Review of Systems history of ischemic cardiomyopathy COPD  hyperlipidemia.  On Plavix and aspirin.   Objective: Vital Signs: BP (!) 170/83   Pulse 66   Ht 5\' 2"  (1.575 m)   Wt 186 lb (84.4 kg)   BMI 34.02 kg/m   Physical Exam Constitutional:      Appearance: She is well-developed.  HENT:     Head: Normocephalic.     Right Ear: External ear normal.     Left Ear: External ear normal.  Eyes:     Pupils: Pupils are equal, round, and reactive to light.  Neck:     Thyroid: No thyromegaly.     Trachea: No tracheal deviation.  Cardiovascular:     Rate and Rhythm: Normal rate.  Pulmonary:     Effort: Pulmonary effort is normal.  Abdominal:     Palpations: Abdomen is soft.  Skin:    General: Skin is warm and dry.  Neurological:     Mental Status: She is alert and oriented to person, place, and time.  Psychiatric:        Behavior: Behavior normal.     Ortho Exam some left sciatic notch tenderness left trochanteric bursal tenderness.  Healed L4-5 lumbar incision.  Knee and ankle jerk are intact.  Gastrocsoleus is strong.  She again fatigues with toe walking on the left calf.  Specialty Comments:  No specialty comments available.  Imaging: CLINICAL DATA:  Low back pain. Left anterior leg pain. History of back surgery  EXAM: MRI LUMBAR SPINE WITHOUT AND WITH CONTRAST  TECHNIQUE: Multiplanar and multiecho pulse sequences of the lumbar spine were obtained without and with intravenous contrast.  CONTRAST:  20mL MULTIHANCE GADOBENATE DIMEGLUMINE 529 MG/ML IV SOLN  COMPARISON:  Lumbar MRI 06/20/2014  FINDINGS: Segmentation:  Normal  Alignment:  Mild retrolisthesis L1-2, L2-3, L3-4, L5-S1.  Vertebrae:  Negative for fracture or mass.  Conus medullaris and cauda equina: Conus extends to the L1-2 level. Conus and cauda equina appear normal.  Paraspinal and other soft tissues: negative for paraspinous mass or adenopathy. Bilateral renal cysts.  Disc levels:  T12-L1: Mild facet degeneration. Normal disc space.  Negative for stenosis  L1-2: Shallow left foraminal disc protrusion and spurring unchanged. Moderate left foraminal stenosis. Mild facet degeneration  L2-3: Moderate disc degeneration with disc space narrowing and diffuse endplate spurring. Mild facet degeneration. Mild subarticular stenosis bilaterally.  L3-4: Disc degeneration with disc bulging and endplate spurring. Mild facet degeneration. Left-sided disc protrusion unchanged causing moderate subarticular and foraminal stenosis on the left. Mild to moderate spinal stenosis unchanged. Moderate right subarticular stenosis  L4-5: Interval laminectomy and decompression. Spinal canal adequate in size. Diffuse disc bulging and endplate spurring left greater than right. Moderate facet degeneration. Moderate subarticular stenosis on the right. Severe subarticular and foraminal stenosis on the left unchanged.  L5-S1: Moderate to advanced disc degeneration with disc space narrowing and diffuse endplate spurring. Severe subarticular and foraminal stenosis bilaterally left greater than right, unchanged.  IMPRESSION: Multilevel disc and facet degeneration throughout the lumbar spine. Significant spinal and foraminal stenosis at multiple levels.  Mild to moderate spinal stenosis L3-4. Left-sided disc protrusion and spurring causing moderate subarticular and foraminal stenosis on the left  Interval laminectomy L4-5. Severe subarticular and foraminal stenosis on the left at L4-5 due to disc bulging and spurring.  Disc degeneration and spurring L5-S1 with severe subarticular foraminal stenosis bilaterally left greater than right.   Electronically Signed   By: Franchot Gallo M.D.   On: 09/13/2020 13:43   PMFS History: Patient Active Problem List   Diagnosis Date Noted  . Herpes virus disease 06/27/2019  . Rash 06/26/2019  . Thyroid mass 01/31/2019  . Need for influenza vaccination 01/31/2019  . Lower respiratory  infection 08/17/2018  . Presbycusis of right ear 03/29/2018  . PVD (peripheral vascular disease) ([place]) 03/29/2018  . Leg pain, anterior, left 02/27/2018  . Groin pain, chronic, left 02/27/2018  . Seborrheic keratoses 02/27/2018  . Acute maxillary sinusitis 10/26/2017  . Seasonal allergic rhinitis due to pollen 10/11/2017  . Acute bronchitis with COPD ([place]) 10/11/2017  . Essential hypertension 10/11/2017  . Pulmonary emphysema ([place]) 10/11/2017  . Cough 10/11/2017  . Dysfunction of right eustachian tube 10/11/2017  . Ischemic cardiomyopathy 10/04/2017  . Screen for colon cancer 09/11/2017  . Leg cramps 09/11/2017  . History of pneumonia 09/11/2017  . History of abnormal mammogram 09/11/2017  . Need for 23-polyvalent pneumococcal polysaccharide vaccine 09/11/2017  . Estrogen deficiency 09/11/2017  . Tobacco use 10/04/2016  . Coronary artery disease involving native heart 04/28/2015  . Hx of microdiscectomy 07/25/2014  . Hyperlipidemia 01/16/2013  . Chest pain 01/10/2013  . Family history of heart disease 01/10/2013   Past Medical History:  Diagnosis Date  . Aortic insufficiency    a. Mild AI by echo 2014.  . Arthritis   . CAD (coronary artery  disease)    a. Anterior STEMI 01/2015 s/p overlapping DES x2 to prox LAD, DES to prox RCA in [facility].  . Chronic back pain    stenosis/HNP  . History of bronchitis 2014  . History of colon polyps   . Hyperlipidemia   . Ischemic cardiomyopathy   . Joint pain   . Joint swelling   . Pneumonia 2014  . Smokers' cough ([place])   . Stress headaches   . Tobacco abuse     Family History  Problem Relation Age of Onset  . Heart disease Mother   . Heart failure Mother   . Heart disease Father   . Heart failure Father   . CAD Other        both parents and multiple siblings with either died or had-related issues  . Breast cancer Sister     Past Surgical History:  Procedure Laterality Date  . ABDOMINAL HYSTERECTOMY     at  age 61  . BREAST BIOPSY Right    roughly 30 years ago with [facility]   . cataract surgery Bilateral   . CHOLECYSTECTOMY    . colonscopy    . ERCP    . ESOPHAGOGASTRODUODENOSCOPY    . LUMBAR LAMINECTOMY Right 07/25/2014   Procedure: Right L4 Hemilaminectomy, Right L4-5 Laminotomy , Decompression, Microdisectomy;  Surgeon: Marybelle Killings, MD;  Location: [place];  Service: Orthopedics;  Laterality: Right;  . SHOULDER SURGERY Right   . TONSILLECTOMY     as a child   Social History   Occupational History  . Not on file  Tobacco Use  . Smoking status: Current Every Day Smoker    Packs/day: 0.50    Years: 50.00    Pack years: 25.00    Types: Cigarettes  . Smokeless tobacco: Never Used  Substance and Sexual Activity  . Alcohol use: No  . Drug use: No  . Sexual activity: Yes    Birth control/protection: Surgical

## 2020-09-17 ENCOUNTER — Other Ambulatory Visit: Payer: Medicare Other

## 2020-09-22 DIAGNOSIS — H04123 Dry eye syndrome of bilateral lacrimal glands: Secondary | ICD-10-CM | POA: Diagnosis not present

## 2020-09-22 DIAGNOSIS — H02834 Dermatochalasis of left upper eyelid: Secondary | ICD-10-CM | POA: Diagnosis not present

## 2020-09-22 DIAGNOSIS — H35033 Hypertensive retinopathy, bilateral: Secondary | ICD-10-CM | POA: Diagnosis not present

## 2020-09-22 DIAGNOSIS — H02052 Trichiasis without entropian right lower eyelid: Secondary | ICD-10-CM | POA: Diagnosis not present

## 2020-09-22 DIAGNOSIS — H0469 Other changes of lacrimal passages: Secondary | ICD-10-CM | POA: Diagnosis not present

## 2020-09-22 DIAGNOSIS — H02831 Dermatochalasis of right upper eyelid: Secondary | ICD-10-CM | POA: Diagnosis not present

## 2020-09-22 DIAGNOSIS — H26493 Other secondary cataract, bilateral: Secondary | ICD-10-CM | POA: Diagnosis not present

## 2020-10-05 ENCOUNTER — Ambulatory Visit: Payer: Self-pay

## 2020-10-05 ENCOUNTER — Encounter: Payer: Self-pay | Admitting: Physical Medicine and Rehabilitation

## 2020-10-05 ENCOUNTER — Ambulatory Visit (INDEPENDENT_AMBULATORY_CARE_PROVIDER_SITE_OTHER): Payer: Medicare Other | Admitting: Physical Medicine and Rehabilitation

## 2020-10-05 ENCOUNTER — Other Ambulatory Visit: Payer: Self-pay

## 2020-10-05 VITALS — BP 166/85 | HR 64

## 2020-10-05 DIAGNOSIS — M5416 Radiculopathy, lumbar region: Secondary | ICD-10-CM

## 2020-10-05 DIAGNOSIS — M961 Postlaminectomy syndrome, not elsewhere classified: Secondary | ICD-10-CM

## 2020-10-05 MED ORDER — METHYLPREDNISOLONE ACETATE 80 MG/ML IJ SUSP
80.0000 mg | Freq: Once | INTRAMUSCULAR | Status: AC
  Administered 2020-10-05: 80 mg

## 2020-10-05 NOTE — Procedures (Signed)
Lumbosacral Transforaminal Epidural Steroid Injection - Sub-Pedicular Approach with Fluoroscopic Guidance  Patient: Terri [name]      Date of Birth: Oct 29, 1945 MRN: [id number] PCP: Libby Maw, MD      Visit Date: 10/05/2020   Universal Protocol:    Date/Time: 10/05/2020  Consent Given By: the patient  Position: PRONE  Additional Comments: Vital signs were monitored before and after the procedure. Patient was prepped and draped in the usual sterile fashion. The correct patient, procedure, and site was verified.   Injection Procedure Details:   Procedure diagnoses: Lumbar radiculopathy [M54.16]    Meds Administered:  Meds ordered this encounter  Medications  . methylPREDNISolone acetate (DEPO-MEDROL) injection 80 mg    Laterality: Left  Location/Site:  L4-L5  Needle:5.0 in., 22 ga.  Short bevel or Quincke spinal needle  Needle Placement: Transforaminal  Findings:    -Comments: Excellent flow of contrast along the nerve, nerve root and into the epidural space.  Procedure Details: After squaring off the end-plates to get a true AP view, the C-arm was positioned so that an oblique view of the foramen as noted above was visualized. The target area is just inferior to the "nose of the scotty dog" or sub pedicular. The soft tissues overlying this structure were infiltrated with 2-3 ml. of 1% Lidocaine without Epinephrine.  The spinal needle was inserted toward the target using a "trajectory" view along the fluoroscope beam.  Under AP and lateral visualization, the needle was advanced so it did not puncture dura and was located close the 6 O'Clock position of the pedical in AP tracterory. Biplanar projections were used to confirm position. Aspiration was confirmed to be negative for CSF and/or blood. A 1-2 ml. volume of Isovue-250 was injected and flow of contrast was noted at each level. Radiographs were obtained for documentation purposes.   After attaining the  desired flow of contrast documented above, a 0.5 to 1.0 ml test dose of 0.25% Marcaine was injected into each respective transforaminal space.  The patient was observed for 90 seconds post injection.  After no sensory deficits were reported, and normal lower extremity motor function was noted,   the above injectate was administered so that equal amounts of the injectate were placed at each foramen (level) into the transforaminal epidural space.   Additional Comments:  The patient tolerated the procedure well Dressing: 2 x 2 sterile gauze and Band-Aid    Post-procedure details: Patient was observed during the procedure. Post-procedure instructions were reviewed.  Patient left the clinic in stable condition.

## 2020-10-05 NOTE — Patient Instructions (Signed)

## 2020-10-05 NOTE — Progress Notes (Signed)
Pt state lower back pain that travels down her left leg to her foot. Pt state standing and bending makes the pain worse. Pt state she take pain meds and uses heating pads to help ease her pain.  Numeric Pain Rating Scale and Functional Assessment Average Pain 5   In the last MONTH (on 0-10 scale) has pain interfered with the following?  1. General activity like being  able to carry out your everyday physical activities such as walking, climbing stairs, carrying groceries, or moving a chair?  Rating(7)   +Driver, +BT, -Dye Allergies.

## 2020-10-05 NOTE — Progress Notes (Signed)
Terri [name] - 75 y.o. female MRN [id number]  Date of birth: [birth year]  Office Visit Note: Visit Date: 10/05/2020 PCP: Terri Maw, MD Referred by: Terri [name],*  Subjective: Chief Complaint  Patient presents with  . Lower Back - Pain  . Left Leg - Pain  . Left Foot - Pain   HPI:  Terri [name] is a 75 y.o. female who comes in today at the request of Dr. Rodell Perna for planned Left L4 Lumbar epidural steroid injection with fluoroscopic guidance.  The patient has failed conservative care including home exercise, medications, time and activity modification.  This injection will be diagnostic and hopefully therapeutic.  Please see requesting physician notes for further details and justification. Pain down front of leg and top of foot is swollen.   ROS Otherwise per HPI.  Assessment & Plan: Visit Diagnoses:    ICD-10-CM   1. Lumbar radiculopathy  M54.16 XR C-ARM NO REPORT    Epidural Steroid injection    methylPREDNISolone acetate (DEPO-MEDROL) injection 80 mg  2. Post laminectomy syndrome  M96.1     Plan: No additional findings.   Meds & Orders:  Meds ordered this encounter  Medications  . methylPREDNISolone acetate (DEPO-MEDROL) injection 80 mg    Orders Placed This Encounter  Procedures  . XR C-ARM NO REPORT  . Epidural Steroid injection    Follow-up: Return for visit to requesting physician as needed.   Procedures: No procedures performed  Lumbosacral Transforaminal Epidural Steroid Injection - Sub-Pedicular Approach with Fluoroscopic Guidance  Patient: Terri [name]      Date of Birth: 07/15/1945 MRN: [id number] PCP: Terri Maw, MD      Visit Date: 10/05/2020   Universal Protocol:    Date/Time: 10/05/2020  Consent Given By: the patient  Position: PRONE  Additional Comments: Vital signs were monitored before and after the procedure. Patient was prepped and draped in the usual sterile fashion. The correct patient,  procedure, and site was verified.   Injection Procedure Details:   Procedure diagnoses: Lumbar radiculopathy [M54.16]    Meds Administered:  Meds ordered this encounter  Medications  . methylPREDNISolone acetate (DEPO-MEDROL) injection 80 mg    Laterality: Left  Location/Site:  L4-L5  Needle:5.0 in., 22 ga.  Short bevel or Quincke spinal needle  Needle Placement: Transforaminal  Findings:    -Comments: Excellent flow of contrast along the nerve, nerve root and into the epidural space.  Procedure Details: After squaring off the end-plates to get a true AP view, the C-arm was positioned so that an oblique view of the foramen as noted above was visualized. The target area is just inferior to the "nose of the scotty dog" or sub pedicular. The soft tissues overlying this structure were infiltrated with 2-3 ml. of 1% Lidocaine without Epinephrine.  The spinal needle was inserted toward the target using a "trajectory" view along the fluoroscope beam.  Under AP and lateral visualization, the needle was advanced so it did not puncture dura and was located close the 6 O'Clock position of the pedical in AP tracterory. Biplanar projections were used to confirm position. Aspiration was confirmed to be negative for CSF and/or blood. A 1-2 ml. volume of Isovue-250 was injected and flow of contrast was noted at each level. Radiographs were obtained for documentation purposes.   After attaining the desired flow of contrast documented above, a 0.5 to 1.0 ml test dose of 0.25% Marcaine was injected into each respective transforaminal space.  The  patient was observed for 90 seconds post injection.  After no sensory deficits were reported, and normal lower extremity motor function was noted,   the above injectate was administered so that equal amounts of the injectate were placed at each foramen (level) into the transforaminal epidural space.   Additional Comments:  The patient tolerated the procedure  well Dressing: 2 x 2 sterile gauze and Band-Aid    Post-procedure details: Patient was observed during the procedure. Post-procedure instructions were reviewed.  Patient left the clinic in stable condition.      Clinical History: MRI LUMBAR SPINE WITHOUT AND WITH CONTRAST  TECHNIQUE: Multiplanar and multiecho pulse sequences of the lumbar spine were obtained without and with intravenous contrast.  CONTRAST:  19mL MULTIHANCE GADOBENATE DIMEGLUMINE 529 MG/ML IV SOLN  COMPARISON:  Lumbar MRI 06/20/2014  FINDINGS: Segmentation:  Normal  Alignment:  Mild retrolisthesis L1-2, L2-3, L3-4, L5-S1.  Vertebrae:  Negative for fracture or mass.  Conus medullaris and cauda equina: Conus extends to the L1-2 level. Conus and cauda equina appear normal.  Paraspinal and other soft tissues: negative for paraspinous mass or adenopathy. Bilateral renal cysts.  Disc levels:  T12-L1: Mild facet degeneration. Normal disc space. Negative for stenosis  L1-2: Shallow left foraminal disc protrusion and spurring unchanged. Moderate left foraminal stenosis. Mild facet degeneration  L2-3: Moderate disc degeneration with disc space narrowing and diffuse endplate spurring. Mild facet degeneration. Mild subarticular stenosis bilaterally.  L3-4: Disc degeneration with disc bulging and endplate spurring. Mild facet degeneration. Left-sided disc protrusion unchanged causing moderate subarticular and foraminal stenosis on the left. Mild to moderate spinal stenosis unchanged. Moderate right subarticular stenosis  L4-5: Interval laminectomy and decompression. Spinal canal adequate in size. Diffuse disc bulging and endplate spurring left greater than right. Moderate facet degeneration. Moderate subarticular stenosis on the right. Severe subarticular and foraminal stenosis on the left unchanged.  L5-S1: Moderate to advanced disc degeneration with disc space narrowing and diffuse  endplate spurring. Severe subarticular and foraminal stenosis bilaterally left greater than right, unchanged.  IMPRESSION: Multilevel disc and facet degeneration throughout the lumbar spine. Significant spinal and foraminal stenosis at multiple levels.  Mild to moderate spinal stenosis L3-4. Left-sided disc protrusion and spurring causing moderate subarticular and foraminal stenosis on the left  Interval laminectomy L4-5. Severe subarticular and foraminal stenosis on the left at L4-5 due to disc bulging and spurring.  Disc degeneration and spurring L5-S1 with severe subarticular foraminal stenosis bilaterally left greater than right.   Electronically Signed   By: Franchot Gallo M.D.   On: 09/13/2020 13:43     Objective:  VS:  HT:    WT:   BMI:     BP:(!) 166/85  HR:64bpm  TEMP: ( )  RESP:  Physical Exam   Imaging: No results found.

## 2020-10-21 DIAGNOSIS — H0469 Other changes of lacrimal passages: Secondary | ICD-10-CM | POA: Diagnosis not present

## 2020-10-21 DIAGNOSIS — H04123 Dry eye syndrome of bilateral lacrimal glands: Secondary | ICD-10-CM | POA: Diagnosis not present

## 2020-10-28 ENCOUNTER — Other Ambulatory Visit: Payer: Self-pay

## 2020-10-28 ENCOUNTER — Encounter: Payer: Self-pay | Admitting: Orthopaedic Surgery

## 2020-10-28 ENCOUNTER — Ambulatory Visit (INDEPENDENT_AMBULATORY_CARE_PROVIDER_SITE_OTHER): Payer: Medicare Other | Admitting: Orthopaedic Surgery

## 2020-10-28 DIAGNOSIS — I255 Ischemic cardiomyopathy: Secondary | ICD-10-CM

## 2020-10-28 DIAGNOSIS — M5126 Other intervertebral disc displacement, lumbar region: Secondary | ICD-10-CM | POA: Insufficient documentation

## 2020-10-28 NOTE — Progress Notes (Signed)
Office Visit Note   Patient: Terri [name]           Date of Birth: Jan 31, 1946           MRN: [id number] Visit Date: 10/28/2020              Requested by: Libby Maw, MD [address] [place],  [place] [address] PCP: Libby Maw, MD   Assessment & Plan: Visit Diagnoses:  1. Protrusion of lumbar intervertebral disc     Plan: Patient got significant improvement with injection.  She can gradually resume normal activity and can follow-up on an as-needed basis.  Follow-Up Instructions: Return if symptoms worsen or fail to improve.   Orders:  No orders of the defined types were placed in this encounter.  No orders of the defined types were placed in this encounter.     Procedures: No procedures performed   Clinical Data: No additional findings.   Subjective: Chief Complaint  Patient presents with  . Lower Back - Follow-up    HPI 75 year old female returns post epidural and states she is feeling much better.  She had back pain leg pain primarily into her left heel.  She had some disc protrusion at L3-4 on the left, L4-5 on the left and L5-S1.  She had previous right L4-5 hemilaminectomy.  She is moving better and states she feels good enough that she would like to resume playing golf if she could. Review of Systems all the systems updated and unchanged.   Objective: Vital Signs: Ht 5' 2.5" (1.588 m)   Wt 184 lb (83.5 kg)   BMI 33.12 kg/m   Physical Exam Constitutional:      Appearance: She is well-developed.  HENT:     Head: Normocephalic.     Right Ear: External ear normal.     Left Ear: External ear normal.  Eyes:     Pupils: Pupils are equal, round, and reactive to light.  Neck:     Thyroid: No thyromegaly.     Trachea: No tracheal deviation.  Cardiovascular:     Rate and Rhythm: Normal rate.  Pulmonary:     Effort: Pulmonary effort is normal.  Abdominal:     Palpations: Abdomen is soft.  Skin:    General: Skin is warm  and dry.  Neurological:     Mental Status: She is alert and oriented to person, place, and time.  Psychiatric:        Behavior: Behavior normal.     Ortho Exam normal heel toe gait.  She gets from sitting standing rapidly.  Negative logroll of the hips.  No anterior tib gastrocsoleus weakness.  Well-healed lumbar incision. Specialty Comments:  No specialty comments available.  Imaging: No results found.   PMFS History: Patient Active Problem List   Diagnosis Date Noted  . Protrusion of lumbar intervertebral disc 10/28/2020  . Herpes virus disease 06/27/2019  . Rash 06/26/2019  . Thyroid mass 01/31/2019  . Need for influenza vaccination 01/31/2019  . Lower respiratory infection 08/17/2018  . Presbycusis of right ear 03/29/2018  . PVD (peripheral vascular disease) ([place]) 03/29/2018  . Leg pain, anterior, left 02/27/2018  . Groin pain, chronic, left 02/27/2018  . Seborrheic keratoses 02/27/2018  . Acute maxillary sinusitis 10/26/2017  . Seasonal allergic rhinitis due to pollen 10/11/2017  . Acute bronchitis with COPD ([place]) 10/11/2017  . Essential hypertension 10/11/2017  . Pulmonary emphysema ([place]) 10/11/2017  . Cough 10/11/2017  . Dysfunction of right  eustachian tube 10/11/2017  . Ischemic cardiomyopathy 10/04/2017  . Screen for colon cancer 09/11/2017  . Leg cramps 09/11/2017  . History of pneumonia 09/11/2017  . History of abnormal mammogram 09/11/2017  . Need for 23-polyvalent pneumococcal polysaccharide vaccine 09/11/2017  . Estrogen deficiency 09/11/2017  . Tobacco use 10/04/2016  . Coronary artery disease involving native heart 04/28/2015  . Hx of microdiscectomy 07/25/2014  . Hyperlipidemia 01/16/2013  . Chest pain 01/10/2013  . Family history of heart disease 01/10/2013   Past Medical History:  Diagnosis Date  . Aortic insufficiency    a. Mild AI by echo 2014.  . Arthritis   . CAD (coronary artery disease)    a. Anterior STEMI 01/2015 s/p overlapping DES  x2 to prox LAD, DES to prox RCA in [facility].  . Chronic back pain    stenosis/HNP  . History of bronchitis 2014  . History of colon polyps   . Hyperlipidemia   . Ischemic cardiomyopathy   . Joint pain   . Joint swelling   . Pneumonia 2014  . Smokers' cough ([place])   . Stress headaches   . Tobacco abuse     Family History  Problem Relation Age of Onset  . Heart disease Mother   . Heart failure Mother   . Heart disease Father   . Heart failure Father   . CAD Other        both parents and multiple siblings with either died or had-related issues  . Breast cancer Sister     Past Surgical History:  Procedure Laterality Date  . ABDOMINAL HYSTERECTOMY     at age 16  . BREAST BIOPSY Right    roughly 30 years ago with [facility]   . cataract surgery Bilateral   . CHOLECYSTECTOMY    . colonscopy    . ERCP    . ESOPHAGOGASTRODUODENOSCOPY    . LUMBAR LAMINECTOMY Right 07/25/2014   Procedure: Right L4 Hemilaminectomy, Right L4-5 Laminotomy , Decompression, Microdisectomy;  Surgeon: Marybelle Killings, MD;  Location: [facility];  Service: Orthopedics;  Laterality: Right;  . SHOULDER SURGERY Right   . TONSILLECTOMY     as a child   Social History   Occupational History  . Not on file  Tobacco Use  . Smoking status: Current Every Day Smoker    Packs/day: 0.50    Years: 50.00    Pack years: 25.00    Types: Cigarettes  . Smokeless tobacco: Never Used  Substance and Sexual Activity  . Alcohol use: No  . Drug use: No  . Sexual activity: Yes    Birth control/protection: Surgical

## 2020-12-09 ENCOUNTER — Ambulatory Visit (INDEPENDENT_AMBULATORY_CARE_PROVIDER_SITE_OTHER): Payer: Medicare Other | Admitting: Orthopaedic Surgery

## 2020-12-09 ENCOUNTER — Encounter: Payer: Self-pay | Admitting: Orthopaedic Surgery

## 2020-12-09 VITALS — BP 182/71 | HR 73 | Ht 62.0 in | Wt 181.0 lb

## 2020-12-09 DIAGNOSIS — I255 Ischemic cardiomyopathy: Secondary | ICD-10-CM

## 2020-12-09 DIAGNOSIS — M79605 Pain in left leg: Secondary | ICD-10-CM | POA: Diagnosis not present

## 2020-12-10 NOTE — Progress Notes (Signed)
Office Visit Note   Patient: Terri [name]           Date of Birth: 12/30/1945           MRN: [id number] Visit Date: 12/09/2020              Requested by: Libby Maw, MD [address] [place] [address],  [place] [address] PCP: Libby Maw, MD   Assessment & Plan: Visit Diagnoses:  1. Pain in left leg     Plan: MRI scan in April showed moderate stenosis at L3-4 with a left-sided disc protrusion.  Previous laminectomy L4-5 with some foraminal stenosis on the left.  Some narrowing at L5-S1 left greater than right.  We discussed the persistent edema left lower extremity I recommend venous Doppler rule out DVT.  With decreased palpable pulse we will obtain arterial Dopplers.  She does have some stenosis L3-4 may have some arterial claudication causing her left lower extremity pain.  Follow-up after test for review.  Follow-Up Instructions: Return in about 2 weeks (around 12/23/2020).   Orders:  Orders Placed This Encounter  Procedures   VAS [place] LOWER EXTREMITY VENOUS (DVT)   UE ARTERIAL DOPPLER   No orders of the defined types were placed in this encounter.     Procedures: No procedures performed   Clinical Data: No additional findings.   Subjective: Chief Complaint  Patient presents with   Left Leg - Pain    HPI 75 year old female returns she states epidural 10/05/2020 only gave her about 2 weeks of relief.  States she feels like ice water running down her left leg.  She been trying to stay off her leg which seems to help but it has noticed persistent swelling with the left lower extremity but none on the right.  She states when she is up and active she has increased pain and has to stop sit down and rest.  No history of DVT.  No swelling in the opposite right foot.  She has noticed that her foot has difficulty being placed in the shoe.  Review of Systems   Objective: Vital Signs: BP (!) 182/71   Pulse 73   Ht 5\' 2"  (1.575 m)   Wt 181 lb (82.1 kg)    BMI 33.11 kg/m   Physical Exam Constitutional:      Appearance: She is well-developed.  HENT:     Head: Normocephalic.     Right Ear: External ear normal.     Left Ear: External ear normal. There is no impacted cerumen.  Eyes:     Pupils: Pupils are equal, round, and reactive to light.  Neck:     Thyroid: No thyromegaly.     Trachea: No tracheal deviation.  Cardiovascular:     Rate and Rhythm: Normal rate.  Pulmonary:     Effort: Pulmonary effort is normal.  Abdominal:     Palpations: Abdomen is soft.  Musculoskeletal:     Cervical back: No rigidity.  Skin:    General: Skin is warm and dry.  Neurological:     Mental Status: She is alert and oriented to person, place, and time.  Psychiatric:        Behavior: Behavior normal.    Ortho Exam patient has normal heel toe gait.  There is definite swelling left lower extremity versus right.  Trace pulse with palpation.  Specialty Comments:  No specialty comments available.  Imaging: No results found.   PMFS History: Patient Active Problem List  Diagnosis Date Noted   Protrusion of lumbar intervertebral disc 10/28/2020   Herpes virus disease 06/27/2019   Rash 06/26/2019   Thyroid mass 01/31/2019   Need for influenza vaccination 01/31/2019   Lower respiratory infection 08/17/2018   Presbycusis of right ear 03/29/2018   PVD (peripheral vascular disease) ([place]) 03/29/2018   Leg pain, anterior, left 02/27/2018   Groin pain, chronic, left 02/27/2018   Seborrheic keratoses 02/27/2018   Acute maxillary sinusitis 10/26/2017   Seasonal allergic rhinitis due to pollen 10/11/2017   Acute bronchitis with COPD ([place]) 10/11/2017   Essential hypertension 10/11/2017   Pulmonary emphysema ([place]) 10/11/2017   Cough 10/11/2017   Dysfunction of right eustachian tube 10/11/2017   Ischemic cardiomyopathy 10/04/2017   Screen for colon cancer 09/11/2017   Leg cramps 09/11/2017   History of pneumonia 09/11/2017   History of abnormal  mammogram 09/11/2017   Need for 23-polyvalent pneumococcal polysaccharide vaccine 09/11/2017   Estrogen deficiency 09/11/2017   Tobacco use 10/04/2016   Coronary artery disease involving native heart 04/28/2015   Hx of microdiscectomy 07/25/2014   Hyperlipidemia 01/16/2013   Chest pain 01/10/2013   Family history of heart disease 01/10/2013   Past Medical History:  Diagnosis Date   Aortic insufficiency    a. Mild AI by echo 2014.   Arthritis    CAD (coronary artery disease)    a. Anterior STEMI 01/2015 s/p overlapping DES x2 to prox LAD, DES to prox RCA in [facility].   Chronic back pain    stenosis/HNP   History of bronchitis 2014   History of colon polyps    Hyperlipidemia    Ischemic cardiomyopathy    Joint pain    Joint swelling    Pneumonia 2014   Smokers' cough ([place])    Stress headaches    Tobacco abuse     Family History  Problem Relation Age of Onset   Heart disease Mother    Heart failure Mother    Heart disease Father    Heart failure Father    CAD Other        both parents and multiple siblings with either died or had-related issues   Breast cancer Sister     Past Surgical History:  Procedure Laterality Date   ABDOMINAL HYSTERECTOMY     at age 64   BREAST BIOPSY Right    roughly 30 years ago with [facility]    cataract surgery Bilateral    CHOLECYSTECTOMY     colonscopy     ERCP     ESOPHAGOGASTRODUODENOSCOPY     LUMBAR LAMINECTOMY Right 07/25/2014   Procedure: Right L4 Hemilaminectomy, Right L4-5 Laminotomy , Decompression, Microdisectomy;  Surgeon: Marybelle Killings, MD;  Location: [facility];  Service: Orthopedics;  Laterality: Right;   SHOULDER SURGERY Right    TONSILLECTOMY     as a child   Social History   Occupational History   Not on file  Tobacco Use   Smoking status: Every Day    Packs/day: 0.50    Years: 50.00    Pack years: 25.00    Types: Cigarettes   Smokeless tobacco: Never  Substance and Sexual Activity   Alcohol use: No    Drug use: No   Sexual activity: Yes    Birth control/protection: Surgical

## 2020-12-11 ENCOUNTER — Other Ambulatory Visit: Payer: Self-pay

## 2020-12-11 ENCOUNTER — Ambulatory Visit (HOSPITAL_COMMUNITY)
Admission: RE | Admit: 2020-12-11 | Discharge: 2020-12-11 | Disposition: A | Discharge status: Home / Self Care | Payer: Medicare Other | Source: Ambulatory Visit | Attending: Orthopaedic Surgery | Admitting: Orthopaedic Surgery

## 2020-12-11 ENCOUNTER — Other Ambulatory Visit: Payer: Self-pay | Admitting: Radiology

## 2020-12-11 DIAGNOSIS — M79605 Pain in left leg: Secondary | ICD-10-CM | POA: Diagnosis not present

## 2020-12-14 ENCOUNTER — Other Ambulatory Visit: Payer: Self-pay

## 2020-12-14 ENCOUNTER — Ambulatory Visit (HOSPITAL_COMMUNITY)
Admission: RE | Admit: 2020-12-14 | Discharge: 2020-12-14 | Disposition: A | Discharge status: Home / Self Care | Payer: Medicare Other | Source: Ambulatory Visit | Attending: Orthopaedic Surgery | Admitting: Orthopaedic Surgery

## 2020-12-14 DIAGNOSIS — M79605 Pain in left leg: Secondary | ICD-10-CM | POA: Diagnosis not present

## 2020-12-21 DIAGNOSIS — Z20822 Contact with and (suspected) exposure to covid-19: Secondary | ICD-10-CM | POA: Diagnosis not present

## 2020-12-29 ENCOUNTER — Ambulatory Visit (INDEPENDENT_AMBULATORY_CARE_PROVIDER_SITE_OTHER): Payer: Medicare Other | Admitting: Orthopaedic Surgery

## 2020-12-29 ENCOUNTER — Encounter: Payer: Self-pay | Admitting: Orthopaedic Surgery

## 2020-12-29 DIAGNOSIS — I255 Ischemic cardiomyopathy: Secondary | ICD-10-CM | POA: Diagnosis not present

## 2020-12-29 DIAGNOSIS — R600 Localized edema: Secondary | ICD-10-CM | POA: Diagnosis not present

## 2020-12-29 NOTE — Progress Notes (Signed)
Office Visit Note   Patient: Terri [name]           Date of Birth: 1945/11/19           MRN: [id number] Visit Date: 12/29/2020              Requested by: Libby Maw, MD [address] [place],  [place] [address] PCP: Libby Maw, MD   Assessment & Plan: Visit Diagnoses:  1. Lower leg edema     Plan: Patient still has slight edema of the lower extremity we discussed using support stockings or taking a break and elevating her foot if her symptoms increase.  Currently she is gotten significant improvement with time.  She can return if she has increased symptoms.  Follow-Up Instructions: No follow-ups on file.   Orders:  No orders of the defined types were placed in this encounter.  No orders of the defined types were placed in this encounter.     Procedures: No procedures performed   Clinical Data: No additional findings.   Subjective: No chief complaint on file.   HPI 75 year old female returns with mild edema left leg tibial tubercle and distal.  She has been worked up with arterial Dopplers also venous Dopplers.  No evidence of arterial occlusion.  No DVT.  She states her leg pain is actually gotten better.  Previous lumbar MRI did show some disc protrusion L3-4 on the left L4-5 on the left and also L5-S1.  Previous right L4-5 hemilaminectomy surgery done in 2016 without evidence of recurrence.  Review of Systems all the systems updated unchanged.   Objective: Vital Signs: BP (!) 151/72   Pulse 70   Ht '5\' 2"'$  (1.575 m)   Wt 181 lb (82.1 kg)   BMI 33.11 kg/m   Physical Exam Constitutional:      Appearance: She is well-developed.  HENT:     Head: Normocephalic.     Right Ear: External ear normal.     Left Ear: External ear normal. There is no impacted cerumen.  Eyes:     Pupils: Pupils are equal, round, and reactive to light.  Neck:     Thyroid: No thyromegaly.     Trachea: No tracheal deviation.  Cardiovascular:     Rate  and Rhythm: Normal rate.  Pulmonary:     Effort: Pulmonary effort is normal.  Abdominal:     Palpations: Abdomen is soft.  Musculoskeletal:     Cervical back: No rigidity.  Skin:    General: Skin is warm and dry.  Neurological:     Mental Status: She is alert and oriented to person, place, and time.  Psychiatric:        Behavior: Behavior normal.    Ortho Exam patient has mild edema left lower extremity without synovitis of the ankle.  Pulses are normal.  She is Dealer.  Sensation is intact to the foot.  Specialty Comments:  No specialty comments available.  Imaging: No results found.   PMFS History: Patient Active Problem List   Diagnosis Date Noted   Lower leg edema 12/29/2020   Protrusion of lumbar intervertebral disc 10/28/2020   Herpes virus disease 06/27/2019   Rash 06/26/2019   Thyroid mass 01/31/2019   Need for influenza vaccination 01/31/2019   Lower respiratory infection 08/17/2018   Presbycusis of right ear 03/29/2018   PVD (peripheral vascular disease) ([place]) 03/29/2018   Leg pain, anterior, left 02/27/2018   Groin pain, chronic, left 02/27/2018  Seborrheic keratoses 02/27/2018   Acute maxillary sinusitis 10/26/2017   Seasonal allergic rhinitis due to pollen 10/11/2017   Acute bronchitis with COPD ([place]) 10/11/2017   Essential hypertension 10/11/2017   Pulmonary emphysema ([place]) 10/11/2017   Cough 10/11/2017   Dysfunction of right eustachian tube 10/11/2017   Ischemic cardiomyopathy 10/04/2017   Screen for colon cancer 09/11/2017   Leg cramps 09/11/2017   History of pneumonia 09/11/2017   History of abnormal mammogram 09/11/2017   Need for 23-polyvalent pneumococcal polysaccharide vaccine 09/11/2017   Estrogen deficiency 09/11/2017   Tobacco use 10/04/2016   Coronary artery disease involving native heart 04/28/2015   Hx of microdiscectomy 07/25/2014   Hyperlipidemia 01/16/2013   Chest pain 01/10/2013   Family history of heart disease 01/10/2013    Past Medical History:  Diagnosis Date   Aortic insufficiency    a. Mild AI by echo 2014.   Arthritis    CAD (coronary artery disease)    a. Anterior STEMI 01/2015 s/p overlapping DES x2 to prox LAD, DES to prox RCA in [facility].   Chronic back pain    stenosis/HNP   History of bronchitis 2014   History of colon polyps    Hyperlipidemia    Ischemic cardiomyopathy    Joint pain    Joint swelling    Pneumonia 2014   Smokers' cough ([place])    Stress headaches    Tobacco abuse     Family History  Problem Relation Age of Onset   Heart disease Mother    Heart failure Mother    Heart disease Father    Heart failure Father    CAD Other        both parents and multiple siblings with either died or had-related issues   Breast cancer Sister     Past Surgical History:  Procedure Laterality Date   ABDOMINAL HYSTERECTOMY     at age 72   BREAST BIOPSY Right    roughly 30 years ago with [facility]    cataract surgery Bilateral    CHOLECYSTECTOMY     colonscopy     ERCP     ESOPHAGOGASTRODUODENOSCOPY     LUMBAR LAMINECTOMY Right 07/25/2014   Procedure: Right L4 Hemilaminectomy, Right L4-5 Laminotomy , Decompression, Microdisectomy;  Surgeon: Marybelle Killings, MD;  Location: [facility];  Service: Orthopedics;  Laterality: Right;   SHOULDER SURGERY Right    TONSILLECTOMY     as a child   Social History   Occupational History   Not on file  Tobacco Use   Smoking status: Every Day    Packs/day: 0.50    Years: 50.00    Pack years: 25.00    Types: Cigarettes   Smokeless tobacco: Never  Substance and Sexual Activity   Alcohol use: No   Drug use: No   Sexual activity: Yes    Birth control/protection: Surgical

## 2021-01-04 DIAGNOSIS — Z20822 Contact with and (suspected) exposure to covid-19: Secondary | ICD-10-CM | POA: Diagnosis not present

## 2021-02-17 DIAGNOSIS — Z20822 Contact with and (suspected) exposure to covid-19: Secondary | ICD-10-CM | POA: Diagnosis not present

## 2021-02-23 DIAGNOSIS — Z20822 Contact with and (suspected) exposure to covid-19: Secondary | ICD-10-CM | POA: Diagnosis not present

## 2021-03-05 ENCOUNTER — Ambulatory Visit (INDEPENDENT_AMBULATORY_CARE_PROVIDER_SITE_OTHER): Payer: Medicare Other | Admitting: Orthopaedic Surgery

## 2021-03-05 ENCOUNTER — Other Ambulatory Visit: Payer: Self-pay

## 2021-03-05 ENCOUNTER — Encounter: Payer: Self-pay | Admitting: Orthopaedic Surgery

## 2021-03-05 DIAGNOSIS — M65341 Trigger finger, right ring finger: Secondary | ICD-10-CM

## 2021-03-05 DIAGNOSIS — I255 Ischemic cardiomyopathy: Secondary | ICD-10-CM | POA: Diagnosis not present

## 2021-03-08 DIAGNOSIS — M65341 Trigger finger, right ring finger: Secondary | ICD-10-CM

## 2021-03-08 MED ORDER — BUPIVACAINE HCL 0.25 % IJ SOLN
0.5000 mL | INTRAMUSCULAR | Status: AC | PRN
  Administered 2021-03-08: .5 mL

## 2021-03-08 MED ORDER — METHYLPREDNISOLONE ACETATE 40 MG/ML IJ SUSP
20.0000 mg | INTRAMUSCULAR | Status: AC | PRN
  Administered 2021-03-08: 20 mg

## 2021-03-08 MED ORDER — LIDOCAINE HCL 1 % IJ SOLN
0.5000 mL | INTRAMUSCULAR | Status: AC | PRN
  Administered 2021-03-08: .5 mL

## 2021-03-08 NOTE — Progress Notes (Signed)
Office Visit Note   Patient: Terri [name]           Date of Birth: 12/28/1945           MRN: [id number] Visit Date: 03/05/2021              Requested by: Libby Maw, MD [address] [place],  [place] [address] PCP: Libby Maw, MD   Assessment & Plan: Visit Diagnoses:  1. Trigger finger, right ring finger     Plan:   Follow-Up Instructions: No follow-ups on file.   Orders:  No orders of the defined types were placed in this encounter.  No orders of the defined types were placed in this encounter.     Procedures: Hand/UE Inj: R ring A1 for trigger finger on 03/08/2021 8:38 AM Medications: 0.5 mL lidocaine 1 %; 0.5 mL bupivacaine 0.25 %; 20 mg methylPREDNISolone acetate 40 MG/ML     Clinical Data: No additional findings.   Subjective: Chief Complaint  Patient presents with   right ring finger trigger finger    HPI 75 year old female returns with right ring trigger finger symptoms with pain catching.  She is not had injection in the past.  She has to manually extend it and demonstrates locking today.  Review of Systems all other 14 point systems are stable noncontributory to HPI.   Objective: Vital Signs: BP (!) 193/93   Ht 5' 2.5" (1.588 m)   Wt 180 lb (81.6 kg)   BMI 32.40 kg/m   Physical Exam Constitutional:      Appearance: She is well-developed.  HENT:     Head: Normocephalic.     Right Ear: External ear normal.     Left Ear: External ear normal. There is no impacted cerumen.  Eyes:     Pupils: Pupils are equal, round, and reactive to light.  Neck:     Thyroid: No thyromegaly.     Trachea: No tracheal deviation.  Cardiovascular:     Rate and Rhythm: Normal rate.  Pulmonary:     Effort: Pulmonary effort is normal.  Abdominal:     Palpations: Abdomen is soft.  Musculoskeletal:     Cervical back: No rigidity.  Skin:    General: Skin is warm and dry.  Neurological:     Mental Status: She is alert and oriented  to person, place, and time.  Psychiatric:        Behavior: Behavior normal.    Ortho Exam right ring trigger finger demonstrate with tenderness over the A1 pulley no tenderness over the A2 right full.  Ulnar nerve is normal good cervical range of motion.  Specialty Comments:  No specialty comments available.  Imaging: No results found.   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   Rash 06/26/2019   Thyroid mass 01/31/2019   Need for influenza vaccination 01/31/2019   Lower respiratory infection 08/17/2018   Presbycusis of right ear 03/29/2018   PVD (peripheral vascular disease) ([place]) 03/29/2018   Leg pain, anterior, left 02/27/2018   Groin pain, chronic, left 02/27/2018   Seborrheic keratoses 02/27/2018   Acute maxillary sinusitis 10/26/2017   Seasonal allergic rhinitis due to pollen 10/11/2017   Acute bronchitis with COPD ([place]) 10/11/2017   Essential hypertension 10/11/2017   Pulmonary emphysema ([employer]) 10/11/2017  Cough 10/11/2017   Dysfunction of right eustachian tube 10/11/2017   Ischemic cardiomyopathy 10/04/2017   Screen for colon cancer 09/11/2017   Leg cramps 09/11/2017   History of pneumonia 09/11/2017   History of abnormal mammogram 09/11/2017   Need for 23-polyvalent pneumococcal polysaccharide vaccine 09/11/2017   Estrogen deficiency 09/11/2017   Tobacco use 10/04/2016   Coronary artery disease involving native heart 04/28/2015   Hx of microdiscectomy 07/25/2014   Hyperlipidemia 01/16/2013   Chest pain 01/10/2013   Family history of heart disease 01/10/2013   Past Medical History:  Diagnosis Date   Aortic insufficiency    a. Mild AI by echo 2014.   Arthritis    CAD (coronary artery disease)    a. Anterior STEMI 01/2015 s/p overlapping DES x2 to prox LAD, DES to prox  RCA in [facility]   Chronic back pain    stenosis/HNP   History of bronchitis 2014   History of colon polyps    Hyperlipidemia    Ischemic cardiomyopathy    Joint pain    Joint swelling    Pneumonia 2014   Smokers' cough ([place])    Stress headaches    Tobacco abuse     Family History  Problem Relation Age of Onset   Heart disease Mother    Heart failure Mother    Heart disease Father    Heart failure Father    CAD Other        both parents and multiple siblings with either died or had-related issues   Breast cancer Sister     Past Surgical History:  Procedure Laterality Date   ABDOMINAL HYSTERECTOMY     at age 57   BREAST BIOPSY Right    roughly 30 years ago with [facility]    cataract surgery Bilateral    CHOLECYSTECTOMY     colonscopy     ERCP     ESOPHAGOGASTRODUODENOSCOPY     LUMBAR LAMINECTOMY Right 07/25/2014   Procedure: Right L4 Hemilaminectomy, Right L4-5 Laminotomy , Decompression, Microdisectomy;  Surgeon: Marybelle Killings, MD;  Location: [facility];  Service: Orthopedics;  Laterality: Right;   SHOULDER SURGERY Right    TONSILLECTOMY     as a child   Social History   Occupational History   Not on file  Tobacco Use   Smoking status: Every Day    Packs/day: 0.50    Years: 50.00    Pack years: 25.00    Types: Cigarettes   Smokeless tobacco: Never  Substance and Sexual Activity   Alcohol use: No   Drug use: No   Sexual activity: Yes    Birth control/protection: Surgical

## 2021-03-29 DIAGNOSIS — Z20822 Contact with and (suspected) exposure to covid-19: Secondary | ICD-10-CM | POA: Diagnosis not present

## 2021-04-01 ENCOUNTER — Other Ambulatory Visit: Payer: Self-pay | Admitting: Family Medicine

## 2021-04-01 DIAGNOSIS — B009 Herpesviral infection, unspecified: Secondary | ICD-10-CM

## 2021-04-01 MED ORDER — VALACYCLOVIR HCL 1 G PO TABS: ORAL_TABLET | ORAL | 2 refills | Status: DC

## 2021-04-01 NOTE — Telephone Encounter (Signed)
Refill sent in

## 2021-04-01 NOTE — Telephone Encounter (Signed)
Pt requesting refill on valACYclovir (VALTREX) 1000 MG tablet [[id number]]  Pt states she needs 11 pills to take like Dr Ethelene Hal has suggested, instead of the prescribed 7.

## 2021-04-01 NOTE — Telephone Encounter (Signed)
Refill request for pending Rx patient asking for 11 pills what was suggested by Dr. Ethelene Hal previously instead of 7 pills that was prescribed by [facility] on 07/14/20. Please advise. Last OV 09/02/20

## 2021-04-13 NOTE — Progress Notes (Signed)
Cardiology Clinic Note   Patient Name: Terri [name] Date of Encounter: 04/14/2021  Primary Care Provider:  Libby Maw, MD Primary Cardiologist:  Quay Burow, MD  Patient Profile    Terri [name] 75 year old female presents the clinic today for evaluation of her fatigue and acid reflux.  Past Medical History    Past Medical History:  Diagnosis Date   Aortic insufficiency    a. Mild AI by echo 2014.   Arthritis    CAD (coronary artery disease)    a. Anterior STEMI 01/2015 s/p overlapping DES x2 to prox LAD, DES to prox RCA in [facility].   Chronic back pain    stenosis/HNP   History of bronchitis 2014   History of colon polyps    Hyperlipidemia    Ischemic cardiomyopathy    Joint pain    Joint swelling    Pneumonia 2014   Smokers' cough ([place])    Stress headaches    Tobacco abuse    Past Surgical History:  Procedure Laterality Date   ABDOMINAL HYSTERECTOMY     at age 20   BREAST BIOPSY Right    roughly 30 years ago with [facility]    cataract surgery Bilateral    CHOLECYSTECTOMY     colonscopy     ERCP     ESOPHAGOGASTRODUODENOSCOPY     LUMBAR LAMINECTOMY Right 07/25/2014   Procedure: Right L4 Hemilaminectomy, Right L4-5 Laminotomy , Decompression, Microdisectomy;  Surgeon: Marybelle Killings, MD;  Location: [facility];  Service: Orthopedics;  Laterality: Right;   SHOULDER SURGERY Right    TONSILLECTOMY     as a child    Allergies  Allergies  Allergen Reactions   Erythromycin     nauseated    History of Present Illness    Terri [name] has a PMH of essential hypertension, hyperlipidemia, coronary artery disease, tobacco use, and ischemic cardiomyopathy.  She has a strong family history of heart disease with both parents and multiple siblings who died or had cardiac related health issues.  She is a retired Insurance claims handler.  She had an anterior wall MI 01/31/2015.  She was at [facility] at that time.  She was taken to [facility] and was found to have an occluded proximal LAD, occluded marginal branch, and a high-grade proximal RCA stenosis.  She underwent cardiac catheterization and received DES overlapping stents to her proximal LAD and her proximal RCA.  Her EF was 25% which improved to 40-45% on her echocardiogram 03/03/2015.  During that time she continued to smoke half pack per day.  She was seen in follow-up by Dr. Gwenlyn Found on 07/31/2020.  During that time she continued to do well.  She continued to smoke half pack per day and was reluctant to stop smoking.  She denies shortness of breath and chest discomfort.  She reported suffering from bulging disks.  She had finished a course of prednisone which was felt to be contributing to her elevated blood pressure.  She presents the clinic today for follow-up evaluation and states over the last 2 months she has noticed increased reflux.  She notes that her symptoms improve with Rolaids.  They also improve when she sits up.  She denies changes in her breathing.  She does note a cough but relates this to her smoking.  She also has noticed increased fatigue over the last 2 months.  She has increased stress related to caring for her husband who  has health issues and has been falling more frequently.  She does not formally exercise.  We reviewed the importance of stopping smoking and she reports that she needs this to help manage her stress.  We reviewed her previous angiography and echocardiogram.  She reports that she has had some elevated blood pressures at home and will 150/70 range.  Initially today in the office is 158/74 and on recheck it was 138/74.  I will increase her lisinopril to 30 mg daily.  I will give her the GERD diet, start pantoprazole, order an echocardiogram, and plan follow-up after her testing.  We will also order a CBC and BMP today and repeat BMP in 1 week  Today she denies chest pain, increased rates of shortness of breath, lower extremity edema, fatigue,  palpitations, melena, hematuria, hemoptysis, diaphoresis, weakness, presyncope, syncope, orthopnea, and PND.   Home Medications    Prior to Admission medications   Medication Sig Start Date End Date Taking? Authorizing Provider  albuterol (PROVENTIL HFA;VENTOLIN HFA) 108 (90 Base) MCG/ACT inhaler Inhale 1-2 puffs into the lungs every 6 (six) hours as needed for wheezing or shortness of breath. And or cough. 10/26/17   Libby Maw, MD  atorvastatin (LIPITOR) 80 MG tablet Take 1 tablet (80 mg total) by mouth daily. 07/31/20   Lorretta Harp, MD  carvedilol (COREG) 6.25 MG tablet TAKE 1 TABLET BY MOUTH TWICE DAILY 07/31/20   Lorretta Harp, MD  chlorthalidone (HYGROTON) 25 MG tablet Take 0.5 tablets (12.5 mg total) by mouth daily. 07/31/20 07/26/21  Lorretta Harp, MD  clopidogrel (PLAVIX) 75 MG tablet Take 1 tablet (75 mg total) by mouth daily. 07/31/20   Lorretta Harp, MD  [facility] ASPIRIN 81 MG EC tablet Take 81 mg by mouth daily. 02/02/15   [provider]  diazepam (VALIUM) 5 MG tablet TAKE 2 TABs PO 30 MINS BEFORE MRI Patient not taking: No sig reported 08/21/20   Marybelle Killings, MD  gabapentin (NEURONTIN) 100 MG capsule One at night times 5 days then 2 po q HS 09/16/20   Marybelle Killings, MD  lisinopril (ZESTRIL) 20 MG tablet Take 1 tablet (20 mg total) by mouth daily. 07/31/20   Lorretta Harp, MD  predniSONE (STERAPRED UNI-PAK 21 TAB) 10 MG (21) TBPK tablet Take 6 tablets the first day with food then one less tablet each day, 5,4,3,2,1 then stop. Patient not taking: Reported on 03/05/2021 07/24/20   Marybelle Killings, MD  valACYclovir (VALTREX) 1000 MG tablet Take 1/2 tablet three times daily for 7 days. 04/01/21   Libby Maw, MD    Family History    Family History  Problem Relation Age of Onset   Heart disease Mother    Heart failure Mother    Heart disease Father    Heart failure Father    CAD Other        both parents and multiple siblings with either  died or had-related issues   Breast cancer Sister    She indicated that her mother is deceased. She indicated that her father is deceased. She indicated that the status of her sister is unknown. She indicated that the status of her other is unknown.  Social History    Social History   Socioeconomic History   Marital status: Married    Spouse name: Not on file   Number of children: Not on file   Years of education: Not on file   Highest education level: Not  on file  Occupational History   Not on file  Tobacco Use   Smoking status: Every Day    Packs/day: 0.50    Years: 50.00    Pack years: 25.00    Types: Cigarettes   Smokeless tobacco: Never  Substance and Sexual Activity   Alcohol use: No   Drug use: No   Sexual activity: Yes    Birth control/protection: Surgical  Other Topics Concern   Not on file  Social History Narrative   Not on file   Social Determinants of Health   Financial Resource Strain: Low Risk    Difficulty of Paying Living Expenses: Not hard at all  Food Insecurity: No Food Insecurity   Worried About Charity fundraiser in the Last Year: Never true   [place] in the Last Year: Never true  Transportation Needs: No Transportation Needs   Lack of Transportation (Medical): No   Lack of Transportation (Non-Medical): No  Physical Activity: Inactive   Days of Exercise per Week: 0 days   Minutes of Exercise per Session: 0 min  Stress: No Stress Concern Present   Feeling of Stress : Only a little  Social Connections: Moderately Integrated   Frequency of Communication with Friends and Family: More than three times a week   Frequency of Social Gatherings with Friends and Family: More than three times a week   Attends Religious Services: 1 to 4 times per year   Active Member of [employer] or Organizations: No   Attends Archivist Meetings: Never   Marital Status: Married  Human resources officer Violence: Not At Risk   Fear of Current or Ex-Partner: No    Emotionally Abused: No   Physically Abused: No   Sexually Abused: No     Review of Systems    General:  No chills, fever, night sweats or weight changes.  Cardiovascular:  No chest pain, dyspnea on exertion, edema, orthopnea, palpitations, paroxysmal nocturnal dyspnea. Dermatological: No rash, lesions/masses Respiratory: No cough, dyspnea Urologic: No hematuria, dysuria Abdominal:   No nausea, vomiting, diarrhea, bright red blood per rectum, melena, or hematemesis Neurologic:  No visual changes, wkns, changes in mental status. All other systems reviewed and are otherwise negative except as noted above.  Physical Exam    VS:  BP 138/74   Pulse 74   Ht 5' 2.5" (1.588 m)   Wt 182 lb (82.6 kg)   SpO2 97%   BMI 32.76 kg/m  , BMI Body mass index is 32.76 kg/m. GEN: Well nourished, well developed, in no acute distress. HEENT: normal. Neck: Supple, no JVD, carotid bruits, or masses. Cardiac: RRR, no murmurs, rubs, or gallops. No clubbing, cyanosis, edema.  Radials/DP/PT 2+ and equal bilaterally.  Respiratory:  Respirations regular and unlabored, clear to auscultation bilaterally. GI: Soft, nontender, nondistended, BS + x 4. MS: no deformity or atrophy. Skin: warm and dry, no rash. Neuro:  Strength and sensation are intact. Psych: Normal affect.  Accessory Clinical Findings    Recent Labs: 08/14/2020: ALT 15   Recent Lipid Panel    Component Value Date/Time   CHOL 133 08/14/2020 1010   TRIG 170 (H) 08/14/2020 1010   HDL 41 08/14/2020 1010   CHOLHDL 3.2 08/14/2020 1010   CHOLHDL 3.3 09/14/2016 1210   VLDL 54 (H) 09/14/2016 1210   LDLCALC 63 08/14/2020 1010    ECG personally reviewed by me today-normal sinus rhythm left axis deviation moderate voltage criteria for LVH septal infarct undetermined age 22  bpm- No acute changes    Assessment & Plan   1.  Coronary artery disease-denies recent arm neck back or chest discomfort.  Had anterior STEMI 01/31/2015 while in  [place].  Her cardiac catheterization showed occluded proximal LAD, occluded marginal branch and high-grade proximal RCA stenosis.  She received DES overlapping to her proximal LAD and proximal RCA.  Her initial EF was 25% and on recheck her echocardiogram 9/16 showed an EF of 40-45%.  She was noted to have anteroapical wall motion abnormality. Continue carvedilol, atorvastatin, Plavix, lisinopril, aspirin Heart healthy low-sodium diet-salty 6 given Increase physical activity as tolerated  GERD-has been noticing increased reflux over the past 2 months.  Has some relief with Rolaids. GERD diet Smoking cessation Increase physical activity as tolerated Start pantoprazole 20 mg daily  Hyperlipidemia-08/14/2020: Cholesterol, Total 133; HDL 41; LDL Chol Calc (NIH) 63; Triglycerides 170 Continue atorvastatin, aspirin Heart healthy low-sodium diet-salty 6 given Increase physical activity as tolerated  Essential hypertension-BP today 138/74.  Has been somewhat elevated at home in the 944-967 systolic range. Continue carvedilol, Increase lisinopril to 30 mg daily Heart healthy low-sodium diet-salty 6 given Increase physical activity as tolerated Maintain blood pressure log  Ischemic cardiomyopathy-no increased DOE or activity intolerance.  Has noted some increased fatigue.  Echocardiogram 9/16 showed an EF of 40-45% Continue carvedilol, lisinopril Heart healthy low-sodium diet-salty 6 given Increase physical activity as tolerated Repeat echocardiogram  Tobacco use-continues to smoke half pack per day. Smoking cessation recommended Smoking cessation information offered  Disposition: Follow-up with me after echocardiogram   Jossie Ng. Baylea Milburn NP-C    04/14/2021, 9:41 AM [address] Office [phone] Fax [phone]  Notice: This dictation was prepared with Dragon dictation along with smaller phrase technology. Any  transcriptional errors that result from this process are unintentional and may not be corrected upon review.  I spent 14 minutes examining this patient, reviewing medications, and using patient centered shared decision making involving her cardiac care.  Prior to her visit I spent greater than 20 minutes reviewing her past medical history,  medications, and prior cardiac tests.

## 2021-04-14 ENCOUNTER — Ambulatory Visit (INDEPENDENT_AMBULATORY_CARE_PROVIDER_SITE_OTHER): Payer: Medicare Other | Admitting: General Practice

## 2021-04-14 ENCOUNTER — Other Ambulatory Visit: Payer: Self-pay

## 2021-04-14 ENCOUNTER — Encounter: Payer: Self-pay | Admitting: General Practice

## 2021-04-14 VITALS — BP 138/74 | HR 74 | Ht 62.5 in | Wt 182.0 lb

## 2021-04-14 DIAGNOSIS — I2510 Atherosclerotic heart disease of native coronary artery without angina pectoris: Secondary | ICD-10-CM

## 2021-04-14 DIAGNOSIS — Z79899 Other long term (current) drug therapy: Secondary | ICD-10-CM

## 2021-04-14 DIAGNOSIS — I10 Essential (primary) hypertension: Secondary | ICD-10-CM | POA: Diagnosis not present

## 2021-04-14 DIAGNOSIS — K219 Gastro-esophageal reflux disease without esophagitis: Secondary | ICD-10-CM | POA: Diagnosis not present

## 2021-04-14 DIAGNOSIS — E782 Mixed hyperlipidemia: Secondary | ICD-10-CM

## 2021-04-14 DIAGNOSIS — Z720 Tobacco use: Secondary | ICD-10-CM | POA: Diagnosis not present

## 2021-04-14 DIAGNOSIS — I255 Ischemic cardiomyopathy: Secondary | ICD-10-CM

## 2021-04-14 LAB — CBC
Hematocrit: 38.3 % (ref 34.0–46.6)
Hemoglobin: 13.1 g/dL (ref 11.1–15.9)
MCH: 29.1 pg (ref 26.6–33.0)
MCHC: 34.2 g/dL (ref 31.5–35.7)
MCV: 85 fL (ref 79–97)
Platelets: 241 10*3/uL (ref 150–450)
RBC: 4.5 x10E6/uL (ref 3.77–5.28)
RDW: 13.6 % (ref 11.7–15.4)
WBC: 10 10*3/uL (ref 3.4–10.8)

## 2021-04-14 LAB — BASIC METABOLIC PANEL
BUN/Creatinine Ratio: 17 (ref 12–28)
BUN: 14 mg/dL (ref 8–27)
CO2: 22 mmol/L (ref 20–29)
Calcium: 9.1 mg/dL (ref 8.7–10.3)
Chloride: 100 mmol/L (ref 96–106)
Creatinine, Ser: 0.81 mg/dL (ref 0.57–1.00)
Glucose: 90 mg/dL (ref 70–99)
Potassium: 4.3 mmol/L (ref 3.5–5.2)
Sodium: 135 mmol/L (ref 134–144)
eGFR: 76 mL/min/{1.73_m2} (ref >59)

## 2021-04-14 MED ORDER — PANTOPRAZOLE SODIUM 20 MG PO TBEC: 20.0000 mg | DELAYED_RELEASE_TABLET | Freq: Every day | ORAL | 3 refills | Status: DC

## 2021-04-14 MED ORDER — LISINOPRIL 30 MG PO TABS: 30.0000 mg | ORAL_TABLET | Freq: Every day | ORAL | 3 refills | Status: DC

## 2021-04-14 NOTE — Patient Instructions (Addendum)
Medication Instructions:  START PANTOPRAZOLE 20MG  DAILY  INCREASE LISINOPRIL 30MG  DAILY  *If you need a refill on your cardiac medications before your next appointment, please call your pharmacy*  Lab Work: [place] 1 WEEK If you have labs (blood work) drawn today and your tests are completely normal, you will receive your results only by:  [place] (if you have MyChart) OR A paper copy in the mail.  If you have any lab test that is abnormal or we need to change your treatment, we will call you to review the results. You may go to any [facility] that is convenient for you however, we do have a lab in our office that is able to assist you. You DO NOT need an appointment for our lab. The lab is open 8:00am and closes at 4:00pm. Lunch 12:45 - 1:45pm.  Testing/Procedures: Echocardiogram - Your physician has requested that you have an echocardiogram. Echocardiography is a painless test that uses sound waves to create images of your heart. It provides your doctor with information about the size and shape of your heart and how well your heart's chambers and valves are working. This procedure takes approximately one hour. There are no restrictions for this procedure. This will be performed at either our [facility] location - [address], [place] location [facility] 2nd floor.  Special Instructions PLEASE READ AND FOLLOW SMOKING CESSATION TIPS-ATTACHED  TAKE AND LOG YOUR BLOOD PRESSURE  Follow-Up: Your next appointment:  AFTER ECHO  In Person with Quay Burow, MD  OR IF UNAVAILABLE [place], FNP-C   At [facility], you and your health needs are our priority.  As part of our continuing mission to provide you with exceptional heart care, we have created designated Provider Care Teams.  These Care Teams include your primary Cardiologist (physician) and Advanced Practice Providers (APPs -  Physician Assistants and Nurse  Practitioners) who all work together to provide you with the care you need, when you need it.  We recommend signing up for the patient portal called "MyChart".  Sign up information is provided on this After Visit Summary.  MyChart is used to connect with patients for Virtual Visits (Telemedicine).  Patients are able to view lab/test results, encounter notes, upcoming appointments, etc.  Non-urgent messages can be sent to your provider as well.   To learn more about what you can do with MyChart, go to [email or web address].      Steps to Quit Smoking Smoking tobacco is the leading cause of preventable death. It can affect almost every organ in the body. Smoking puts you and people around you at risk for many serious, long-lasting (chronic) diseases. Quitting smoking can be hard, but it is one of the best things that you can do for your health. It is never too late to quit. How do I get ready to quit? When you decide to quit smoking, make a plan to help you succeed. Before you quit: Pick a date to quit. Set a date within the next 2 weeks to give you time to prepare. Write down the reasons why you are quitting. Keep this list in places where you will see it often. Tell your family, friends, and co-workers that you are quitting. Their support is important. Talk with your doctor about the choices that may help you quit. Find out if your health insurance will pay for these treatments. Know the people, places, things, and activities that  make you want to smoke (triggers). Avoid them. What first steps can I take to quit smoking? Throw away all cigarettes at home, at work, and in your car. Throw away the things that you use when you smoke, such as ashtrays and lighters. Clean your car. Make sure to empty the ashtray. Clean your home, including curtains and carpets. What can I do to help me quit smoking? Talk with your doctor about taking medicines and seeing a counselor at the same time. You are more  likely to succeed when you do both. If you are pregnant or breastfeeding, talk with your doctor about counseling or other ways to quit smoking. Do not take medicine to help you quit smoking unless your doctor tells you to do so. To quit smoking: Quit right away Quit smoking totally, instead of slowly cutting back on how much you smoke over a period of time. Go to counseling. You are more likely to quit if you go to counseling sessions regularly. Take medicine You may take medicines to help you quit. Some medicines need a prescription, and some you can buy over-the-counter. Some medicines may contain a drug called nicotine to replace the nicotine in cigarettes. Medicines may: Help you to stop having the desire to smoke (cravings). Help to stop the problems that come when you stop smoking (withdrawal symptoms). Your doctor may ask you to use: Nicotine patches, gum, or lozenges. Nicotine inhalers or sprays. Non-nicotine medicine that is taken by mouth. Find resources Find resources and other ways to help you quit smoking and remain smoke-free after you quit. These resources are most helpful when you use them often. They include: Online chats with a Social worker. Phone quitlines. Printed Furniture conservator/restorer. Support groups or group counseling. Text messaging programs. Mobile phone apps. Use apps on your mobile phone or tablet that can help you stick to your quit plan. There are many free apps for mobile phones and tablets as well as websites. Examples include Quit Guide from the [employer] and smokefree.gov  What things can I do to make it easier to quit?  Talk to your family and friends. Ask them to support and encourage you. Call a phone quitline (1-800-QUIT-NOW), reach out to support groups, or work with a Social worker. Ask people who smoke to not smoke around you. Avoid places that make you want to smoke, such as: Bars. Parties. Smoke-break areas at work. Spend time with people who do not  smoke. Lower the stress in your life. Stress can make you want to smoke. Try these things to help your stress: Getting regular exercise. Doing deep-breathing exercises. Doing yoga. Meditating. Doing a body scan. To do this, close your eyes, focus on one area of your body at a time from head to toe. Notice which parts of your body are tense. Try to relax the muscles in those areas. How will I feel when I quit smoking? Day 1 to 3 weeks Within the first 24 hours, you may start to have some problems that come from quitting tobacco. These problems are very bad 2-3 days after you quit, but they do not often last for more than 2-3 weeks. You may get these symptoms: Mood swings. Feeling restless, nervous, angry, or annoyed. Trouble concentrating. Dizziness. Strong desire for high-sugar foods and nicotine. Weight gain. Trouble pooping (constipation). Feeling like you may vomit (nausea). Coughing or a sore throat. Changes in how the medicines that you take for other issues work in your body. Depression. Trouble sleeping (insomnia). Week 3 and  afterward After the first 2-3 weeks of quitting, you may start to notice more positive results, such as: Better sense of smell and taste. Less coughing and sore throat. Slower heart rate. Lower blood pressure. Clearer skin. Better breathing. Fewer sick days. Quitting smoking can be hard. Do not give up if you fail the first time. Some people need to try a few times before they succeed. Do your best to stick to your quit plan, and talk with your doctor if you have any questions or concerns. Summary Smoking tobacco is the leading cause of preventable death. Quitting smoking can be hard, but it is one of the best things that you can do for your health. When you decide to quit smoking, make a plan to help you succeed. Quit smoking right away, not slowly over a period of time. When you start quitting, seek help from your doctor, family, or friends. This  information is not intended to replace advice given to you by your health care provider. Make sure you discuss any questions you have with your health care provider. Document Revised: 01/29/2021 Document Reviewed: 08/11/2018 Elsevier Patient Education  [place].

## 2021-04-28 ENCOUNTER — Other Ambulatory Visit: Payer: Self-pay

## 2021-04-28 ENCOUNTER — Ambulatory Visit (HOSPITAL_COMMUNITY): Payer: Medicare Other | Attending: General Practice

## 2021-04-28 DIAGNOSIS — E782 Mixed hyperlipidemia: Secondary | ICD-10-CM | POA: Diagnosis not present

## 2021-04-28 DIAGNOSIS — I255 Ischemic cardiomyopathy: Secondary | ICD-10-CM | POA: Diagnosis not present

## 2021-04-28 DIAGNOSIS — Z79899 Other long term (current) drug therapy: Secondary | ICD-10-CM | POA: Insufficient documentation

## 2021-04-28 DIAGNOSIS — I10 Essential (primary) hypertension: Secondary | ICD-10-CM | POA: Insufficient documentation

## 2021-04-28 DIAGNOSIS — I2510 Atherosclerotic heart disease of native coronary artery without angina pectoris: Secondary | ICD-10-CM | POA: Insufficient documentation

## 2021-04-28 DIAGNOSIS — Z720 Tobacco use: Secondary | ICD-10-CM

## 2021-04-28 LAB — ECHOCARDIOGRAM COMPLETE
Area-P 1/2: 2.57 cm2
MV M vel: 5.22 m/s
MV Peak grad: 109 mmHg
P 1/2 time: 539 msec
S' Lateral: 4.1 cm

## 2021-05-19 NOTE — Progress Notes (Signed)
Cardiology Clinic Note   Patient Name: Terri [name] Date of Encounter: 05/20/2021  Primary Care Provider:  Libby Maw, MD Primary Cardiologist:  Quay Burow, MD  Patient Profile    Terri [name] 75 year old female presents the clinic today for evaluation of her fatigue and acid reflux.  Past Medical History    Past Medical History:  Diagnosis Date   Aortic insufficiency    a. Mild AI by echo 2014.   Arthritis    CAD (coronary artery disease)    a. Anterior STEMI 01/2015 s/p overlapping DES x2 to prox LAD, DES to prox RCA in [facility].   Chronic back pain    stenosis/HNP   History of bronchitis 2014   History of colon polyps    Hyperlipidemia    Ischemic cardiomyopathy    Joint pain    Joint swelling    Pneumonia 2014   Smokers' cough ([place])    Stress headaches    Tobacco abuse    Past Surgical History:  Procedure Laterality Date   ABDOMINAL HYSTERECTOMY     at age 72   BREAST BIOPSY Right    roughly 30 years ago with [facility]    cataract surgery Bilateral    CHOLECYSTECTOMY     colonscopy     ERCP     ESOPHAGOGASTRODUODENOSCOPY     LUMBAR LAMINECTOMY Right 07/25/2014   Procedure: Right L4 Hemilaminectomy, Right L4-5 Laminotomy , Decompression, Microdisectomy;  Surgeon: Marybelle Killings, MD;  Location: [place];  Service: Orthopedics;  Laterality: Right;   SHOULDER SURGERY Right    TONSILLECTOMY     as a child    Allergies  Allergies  Allergen Reactions   Erythromycin     nauseated    History of Present Illness    Terri [name] has a PMH of essential hypertension, hyperlipidemia, coronary artery disease, tobacco use, and ischemic cardiomyopathy.  She has a strong family history of heart disease with both parents and multiple siblings who died or had cardiac related health issues.  She is a retired Insurance claims handler.  She had an anterior wall MI 01/31/2015.  She was at [facility] at that time.  She was taken to [facility] and was found to have an occluded proximal LAD, occluded marginal branch, and a high-grade proximal RCA stenosis.  She underwent cardiac catheterization and received DES overlapping stents to her proximal LAD and her proximal RCA.  Her EF was 25% which improved to 40-45% on her echocardiogram 03/03/2015.  During that time she continued to smoke half pack per day.  She was seen in follow-up by Dr. Gwenlyn Found on 07/31/2020.  During that time she continued to do well.  She continued to smoke half pack per day and was reluctant to stop smoking.  She denies shortness of breath and chest discomfort.  She reported suffering from bulging disks.  She had finished a course of prednisone which was felt to be contributing to her elevated blood pressure.  She presented to the clinic 04/14/2021 for follow-up evaluation and stated over the last 2 months she has noticed increased reflux.  She noted that her symptoms improve with Rolaids.  They also improve when she sat up.  She denied changes in her breathing.  She did note a cough but relates this to her smoking.  She also had noticed increased fatigue over the last 2 months.  She had increased stress related to caring for her husband  who has health issues and has been falling more frequently.  She did not formally exercise.  We reviewed the importance of stopping smoking and she reported that she needed this to help manage her stress.  We reviewed her previous angiography and echocardiogram.  She reported that she has had some elevated blood pressures at home and will 150/70 range.  Initially today in the office is 158/74 and on recheck it was 138/74.  I will increased her lisinopril to 30 mg daily.  I gave her the GERD diet, started pantoprazole, ordered an echocardiogram, and plan follow-up after her testing.  I ordered a CBC and BMP today and repeat BMP in 1 week.  Her echocardiogram showed improved EF and G2 DD .  Details below.  Her lab work was  unremarkable.  She presents to the clinic today for follow-up evaluation states she continues to care for her husband.  He now needs a ankle surgery and will be nonweightbearing for 2 months.  She is worried about his upcoming surgery.  She reports that he also recently treated into vehicles and obtained a new vehicle.  This caused more stress.  She brings in her blood pressure log today that shows no improvement in her blood pressure control 619-509 systolic blood pressures.  I will increase her lisinopril to 40 mg daily, order a BMP in 1 week, give her the salty 6 diet sheet, have her increase her physical activity as tolerated, and also give her the mindfulness stress reduction sheet.  We will plan follow-up in 2 months.  I have asked her to contact the office if her blood pressures continue to be elevated.  She expressed understanding.  Today she denies chest pain, increased rates of shortness of breath, lower extremity edema, fatigue, palpitations, melena, hematuria, hemoptysis, diaphoresis, weakness, presyncope, syncope, orthopnea, and PND.   Home Medications    Prior to Admission medications   Medication Sig Start Date End Date Taking? Authorizing Provider  albuterol (PROVENTIL HFA;VENTOLIN HFA) 108 (90 Base) MCG/ACT inhaler Inhale 1-2 puffs into the lungs every 6 (six) hours as needed for wheezing or shortness of breath. And or cough. 10/26/17   Libby Maw, MD  atorvastatin (LIPITOR) 80 MG tablet Take 1 tablet (80 mg total) by mouth daily. 07/31/20   Lorretta Harp, MD  carvedilol (COREG) 6.25 MG tablet TAKE 1 TABLET BY MOUTH TWICE DAILY 07/31/20   Lorretta Harp, MD  chlorthalidone (HYGROTON) 25 MG tablet Take 0.5 tablets (12.5 mg total) by mouth daily. 07/31/20 07/26/21  Lorretta Harp, MD  clopidogrel (PLAVIX) 75 MG tablet Take 1 tablet (75 mg total) by mouth daily. 07/31/20   Lorretta Harp, MD  [facility] ASPIRIN 81 MG EC tablet Take 81 mg by mouth daily. 02/02/15   [provider]  diazepam (VALIUM) 5 MG tablet TAKE 2 TABs PO 30 MINS BEFORE MRI Patient not taking: No sig reported 08/21/20   Marybelle Killings, MD  gabapentin (NEURONTIN) 100 MG capsule One at night times 5 days then 2 po q HS 09/16/20   Marybelle Killings, MD  lisinopril (ZESTRIL) 20 MG tablet Take 1 tablet (20 mg total) by mouth daily. 07/31/20   Lorretta Harp, MD  predniSONE (STERAPRED UNI-PAK 21 TAB) 10 MG (21) TBPK tablet Take 6 tablets the first day with food then one less tablet each day, 5,4,3,2,1 then stop. Patient not taking: Reported on 03/05/2021 07/24/20   Marybelle Killings, MD  valACYclovir (VALTREX) 1000 MG  tablet Take 1/2 tablet three times daily for 7 days. 04/01/21   Libby Maw, MD    Family History    Family History  Problem Relation Age of Onset   Heart disease Mother    Heart failure Mother    Heart disease Father    Heart failure Father    CAD Other        both parents and multiple siblings with either died or had-related issues   Breast cancer Sister    She indicated that her mother is deceased. She indicated that her father is deceased. She indicated that the status of her sister is unknown. She indicated that the status of her other is unknown.  Social History    Social History   Socioeconomic History   Marital status: Married    Spouse name: Not on file   Number of children: Not on file   Years of education: Not on file   Highest education level: Not on file  Occupational History   Not on file  Tobacco Use   Smoking status: Every Day    Packs/day: 0.50    Years: 50.00    Pack years: 25.00    Types: Cigarettes   Smokeless tobacco: Never  Substance and Sexual Activity   Alcohol use: No   Drug use: No   Sexual activity: Yes    Birth control/protection: Surgical  Other Topics Concern   Not on file  Social History Narrative   Not on file   Social Determinants of Health   Financial Resource Strain: Low Risk    Difficulty of Paying Living  Expenses: Not hard at all  Food Insecurity: No Food Insecurity   Worried About Charity fundraiser in the Last Year: Never true   [place] in the Last Year: Never true  Transportation Needs: No Transportation Needs   Lack of Transportation (Medical): No   Lack of Transportation (Non-Medical): No  Physical Activity: Inactive   Days of Exercise per Week: 0 days   Minutes of Exercise per Session: 0 min  Stress: No Stress Concern Present   Feeling of Stress : Only a little  Social Connections: Moderately Integrated   Frequency of Communication with Friends and Family: More than three times a week   Frequency of Social Gatherings with Friends and Family: More than three times a week   Attends Religious Services: 1 to 4 times per year   Active Member of [employer] or Organizations: No   Attends Archivist Meetings: Never   Marital Status: Married  Human resources officer Violence: Not At Risk   Fear of Current or Ex-Partner: No   Emotionally Abused: No   Physically Abused: No   Sexually Abused: No     Review of Systems    General:  No chills, fever, night sweats or weight changes.  Cardiovascular:  No chest pain, dyspnea on exertion, edema, orthopnea, palpitations, paroxysmal nocturnal dyspnea. Dermatological: No rash, lesions/masses Respiratory: No cough, dyspnea Urologic: No hematuria, dysuria Abdominal:   No nausea, vomiting, diarrhea, bright red blood per rectum, melena, or hematemesis Neurologic:  No visual changes, wkns, changes in mental status. All other systems reviewed and are otherwise negative except as noted above.  Physical Exam    VS:  BP 136/72 (BP Location: Left Arm)    Pulse 72    Ht 5' 2.5" (1.588 m)    Wt 180 lb 9.6 oz (81.9 kg)    SpO2 97%  BMI 32.51 kg/m  , BMI Body mass index is 32.51 kg/m. GEN: Well nourished, well developed, in no acute distress. HEENT: normal. Neck: Supple, no JVD, carotid bruits, or masses. Cardiac: RRR, no murmurs, rubs, or  gallops. No clubbing, cyanosis, edema.  Radials/DP/PT 2+ and equal bilaterally.  Respiratory:  Respirations regular and unlabored, clear to auscultation bilaterally. GI: Soft, nontender, nondistended, BS + x 4. MS: no deformity or atrophy. Skin: warm and dry, no rash. Neuro:  Strength and sensation are intact. Psych: Normal affect.  Accessory Clinical Findings    Recent Labs: 08/14/2020: ALT 15 04/14/2021: BUN 14; Creatinine, Ser 0.81; Hemoglobin 13.1; Platelets 241; Potassium 4.3; Sodium 135   Recent Lipid Panel    Component Value Date/Time   CHOL 133 08/14/2020 1010   TRIG 170 (H) 08/14/2020 1010   HDL 41 08/14/2020 1010   CHOLHDL 3.2 08/14/2020 1010   CHOLHDL 3.3 09/14/2016 1210   VLDL 54 (H) 09/14/2016 1210   LDLCALC 63 08/14/2020 1010    ECG personally reviewed by me today-none today.  EKG 04/14/2021 normal sinus rhythm left axis deviation moderate voltage criteria for LVH septal infarct undetermined age 38 bpm- No acute changes  Echocardiogram 04/28/2021 IMPRESSIONS     1. Left ventricular ejection fraction, by estimation, is 50 to 55%. Left  ventricular ejection fraction by 3D volume is 53 %. The left ventricle has  low normal function. The left ventricle has no regional wall motion  abnormalities. There is mild  concentric left ventricular hypertrophy. Left ventricular diastolic  parameters are consistent with Grade II diastolic dysfunction  (pseudonormalization). Elevated left ventricular end-diastolic pressure.   2. Right ventricular systolic function is normal. The right ventricular  size is normal. There is normal pulmonary artery systolic pressure.   3. The mitral valve was not well visualized. Mild to moderate mitral  valve regurgitation.   4. The aortic valve was not well visualized. Aortic valve regurgitation  is mild to moderate.  Assessment & Plan   1. Ischemic cardiomyopathy-euvolemic.  No increased DOE or activity intolerance.  Has noted some  increased fatigue.  Echocardiogram 04/28/2021 showed an EF of 50-55%, G2 DD, mild-moderate mitral valve regurgitation and mild-moderate aortic valve regurgitation.  Echocardiogram 9/16 showed an EF of 40-45% Continue carvedilol,  Increase lisinopril to 40 mg Heart healthy low-sodium diet-salty 6 given Increase physical activity as tolerated BMP  GERD-improved with the addition of Protonix.  Has been noticing increased reflux over the past 2 months.  Continue pantoprazole GERD diet Smoking cessation Increase physical activity as tolerated   Coronary artery disease-no chest pain today.    Had anterior STEMI 01/31/2015 while in [place].  Her cardiac catheterization showed occluded proximal LAD, occluded marginal branch and high-grade proximal RCA stenosis.  She received DES overlapping to her proximal LAD and proximal RCA.  Her initial EF was 25% and on recheck her echocardiogram 9/16 showed an EF of 40-45%.  Follow-up echocardiogram showed EF 50-55% with G2 DD.   Continue carvedilol, atorvastatin, Plavix, lisinopril, aspirin Heart healthy low-sodium diet-salty 6 given Increase physical activity as tolerated  Hyperlipidemia-08/14/2020: Cholesterol, Total 133; HDL 41; LDL Chol Calc (NIH) 63; Triglycerides 170 Continue atorvastatin, aspirin Heart healthy low-sodium diet-salty 6 given Increase physical activity as tolerated  Essential hypertension-BP today 136/72.  Continues to be elevated at home in the 161-096 systolic range. Continue carvedilol, Increase lisinopril to 40 mg daily Heart healthy low-sodium diet-salty 6 given Increase physical activity as tolerated Continue to maintain blood pressure log Repeat BMP  in 1 week Mindfulness stress reduction sheet given  Tobacco use-continues to smoke half pack per day. Smoking cessation recommended Smoking cessation information offered  Disposition: Follow-up with Dr. Gwenlyn Found or me 1 months.   Jossie Ng. Walsie Smeltz NP-C    05/20/2021,  2:37 PM [address] [place] [address] Office [phone] Fax [phone]  Notice: This dictation was prepared with Dragon dictation along with smaller phrase technology. Any transcriptional errors that result from this process are unintentional and may not be corrected upon review.  I spent 14 minutes examining this patient, reviewing medications, and using patient centered shared decision making involving her cardiac care.  Prior to her visit I spent greater than 20 minutes reviewing her past medical history,  medications, and prior cardiac tests.

## 2021-05-20 ENCOUNTER — Other Ambulatory Visit: Payer: Self-pay

## 2021-05-20 ENCOUNTER — Encounter: Payer: Self-pay | Admitting: General Practice

## 2021-05-20 ENCOUNTER — Ambulatory Visit (INDEPENDENT_AMBULATORY_CARE_PROVIDER_SITE_OTHER): Payer: Medicare Other | Admitting: General Practice

## 2021-05-20 VITALS — BP 136/72 | HR 72 | Ht 62.5 in | Wt 180.6 lb

## 2021-05-20 DIAGNOSIS — K219 Gastro-esophageal reflux disease without esophagitis: Secondary | ICD-10-CM

## 2021-05-20 DIAGNOSIS — Z79899 Other long term (current) drug therapy: Secondary | ICD-10-CM | POA: Diagnosis not present

## 2021-05-20 DIAGNOSIS — I2510 Atherosclerotic heart disease of native coronary artery without angina pectoris: Secondary | ICD-10-CM | POA: Diagnosis not present

## 2021-05-20 DIAGNOSIS — I255 Ischemic cardiomyopathy: Secondary | ICD-10-CM | POA: Diagnosis not present

## 2021-05-20 DIAGNOSIS — Z720 Tobacco use: Secondary | ICD-10-CM | POA: Diagnosis not present

## 2021-05-20 DIAGNOSIS — I10 Essential (primary) hypertension: Secondary | ICD-10-CM

## 2021-05-20 DIAGNOSIS — E782 Mixed hyperlipidemia: Secondary | ICD-10-CM | POA: Diagnosis not present

## 2021-05-20 MED ORDER — LISINOPRIL 40 MG PO TABS: 40.0000 mg | ORAL_TABLET | Freq: Every day | ORAL | 6 refills | Status: DC

## 2021-05-20 NOTE — Patient Instructions (Signed)
Medication Instructions:  INCREASE LISINOPRIL 40MG   *If you need a refill on your cardiac medications before your next appointment, please call your pharmacy*  Lab Work: BMET IN 1 WEEK If you have labs (blood work) drawn today and your tests are completely normal, you will receive your results only by:  [place] (if you have MyChart) OR A paper copy in the mail.  If you have any lab test that is abnormal or we need to change your treatment, we will call you to review the results. You may go to any [facility] that is convenient for you however, we do have a lab in our office that is able to assist you. You DO NOT need an appointment for our lab. The lab is open 8:00am and closes at 4:00pm. Lunch 12:45 - 1:45pm.  Special Instructions READ AND FOLLOW STRESS REDUCTION TIPS-ATTACHED  PLEASE READ AND FOLLOW SALTY 6-ATTACHED-1,800 mg daily  TAKE AND LOG YOUR BLOOD PRESSURE DAILY AND BRING WITH YOU TO YOUR FOLLOW UP APPOINTMENT.  READ AND FOLLOW SMOKING CESSATION TIPS-ATTACHED  Follow-Up: Your next appointment:  07-16-2021 at   1:55PM  In Person with Coletta Memos, FNP    At [facility], you and your health needs are our priority.  As part of our continuing mission to provide you with exceptional heart care, we have created designated Provider Care Teams.  These Care Teams include your primary Cardiologist (physician) and Advanced Practice Providers (APPs -  Physician Assistants and Nurse Practitioners) who all work together to provide you with the care you need, when you need it.  We recommend signing up for the patient portal called "MyChart".  Sign up information is provided on this After Visit Summary.  MyChart is used to connect with patients for Virtual Visits (Telemedicine).  Patients are able to view lab/test results, encounter notes, upcoming appointments, etc.  Non-urgent messages can be sent to your provider as well.   To learn more about what you can do with MyChart, go to  [email or web address].           Mindfulness-Based Stress Reduction Mindfulness-based stress reduction (MBSR) is a program that helps people learn to practice mindfulness. Mindfulness is the practice of consciously paying attention to the present moment. MBSR focuses on developing self-awareness, which lets you respond to life stress without judgment or negative feelings. It can be learned and practiced through techniques such as education, breathing exercises, meditation, and yoga. MBSR includes several mindfulness techniques in one program. MBSR works best when you understand the treatment, are willing to try new things, and can commit to spending time practicing what you learn. MBSR training may include learning about: How your feelings, thoughts, and reactions affect your body. New ways to respond to things that cause negative thoughts to start (triggers). How to notice your thoughts and let go of them. Practicing awareness of everyday things that you normally do without thinking. The techniques and goals of different types of meditation. What are the benefits of MBSR? MBSR can have many benefits, which include helping you to: Develop self-awareness. This means knowing and understanding yourself. Learn skills and attitudes that help you to take part in your own health care. Learn new ways to care for yourself. Be more accepting about how things are, and let things go. Be less judgmental and approach things with an open mind. Be patient with yourself and trust yourself more. MBSR has also been shown to: Reduce negative emotions, such as sadness, overwhelm, and worry. Improve memory and  focus. Change how you sense and react to pain. Boost your body's ability to fight infections. Help you connect better with other people. Improve your sense of well-being. How to practice mindfulness To do a basic awareness exercise: Find a comfortable place to sit. Pay attention to the present  moment. Notice your thoughts, feelings, and surroundings just as they are. Avoid judging yourself, your feelings, or your surroundings. Make note of any judgment that comes up and let it go. Your mind may wander, and that is okay. Make note of when your thoughts drift, and return your attention to the present moment. To do basic mindfulness meditation: Find a comfortable place to sit. This may include a stable chair or a firm floor cushion. Sit upright with your back straight. Let your arms fall next to your sides, with your hands resting on your legs. If you are sitting in a chair, rest your feet flat on the floor. If you are sitting on a cushion, cross your legs in front of you. Keep your head in a neutral position with your chin dropped slightly. Relax your jaw and rest the tip of your tongue on the roof of your mouth. Drop your gaze to the floor or close your eyes. Breathe normally and pay attention to your breath. Feel the air moving in and out of your nose. Feel your belly expanding and relaxing with each breath. Your mind may wander, and that is okay. Make note of when your thoughts drift, and return your attention to your breath. Avoid judging yourself, your feelings, or your surroundings. Make note of any judgment or feelings that come up, let them go, and bring your attention back to your breath. When you are ready, lift your gaze or open your eyes. Pay attention to how your body feels after the meditation. Follow these instructions at home:  Find a local in-person or online MBSR program. Set aside some time regularly for mindfulness practice. Practice every day if you can. Even 10 minutes of practice is helpful. Find a mindfulness practice that works best for you. This may include one or more of the following: Meditation. This involves focusing your mind on a certain thought or activity. Breathing awareness exercises. These help you to stay present by focusing on your breath. Body  scan. For this practice, you lie down and pay attention to each part of your body from head to toe. You can identify tension and soreness and consciously relax parts of your body. Yoga. Yoga involves stretching and breathing, and it can improve your ability to move and be flexible. It can also help you to test your body's limits, which can help you release stress. Mindful eating. This way of eating involves focusing on the taste, texture, color, and smell of each bite of food. This slows down eating and helps you feel full sooner. For this reason, it can be an important part of a weight loss plan. Find a podcast or recording that provides guidance for breathing awareness, body scan, or meditation exercises. You can listen to these any time when you have a free moment to rest without distractions. Follow your treatment plan as told by your health care provider. This may include taking regular medicines and making changes to your diet or lifestyle as recommended. Where to find more information You can find more information about MBSR from: Your health care provider. Community-based meditation centers or programs. Programs offered near you. Summary Mindfulness-based stress reduction (MBSR) is a program that teaches you  how to consciously pay attention to the present moment. It is used to help you deal better with daily stress, feelings, and pain. MBSR focuses on developing self-awareness, which allows you to respond to life stress without judgment or negative feelings. MBSR programs may involve learning different mindfulness practices, such as breathing exercises, meditation, yoga, body scan, or mindful eating. Find a mindfulness practice that works best for you, and set aside time for it on a regular basis. This information is not intended to replace advice given to you by your health care provider. Make sure you discuss any questions you have with your health care provider. Document Revised: 12/31/2020  Document Reviewed: 12/31/2020 Elsevier Patient Education  [place].   Steps to Quit Smoking Smoking tobacco is the leading cause of preventable death. It can affect almost every organ in the body. Smoking puts you and people around you at risk for many serious, long-lasting (chronic) diseases. Quitting smoking can be hard, but it is one of the best things that you can do for your health. It is never too late to quit. How do I get ready to quit? When you decide to quit smoking, make a plan to help you succeed. Before you quit: Pick a date to quit. Set a date within the next 2 weeks to give you time to prepare. Write down the reasons why you are quitting. Keep this list in places where you will see it often. Tell your family, friends, and co-workers that you are quitting. Their support is important. Talk with your doctor about the choices that may help you quit. Find out if your health insurance will pay for these treatments. Know the people, places, things, and activities that make you want to smoke (triggers). Avoid them. What first steps can I take to quit smoking? Throw away all cigarettes at home, at work, and in your car. Throw away the things that you use when you smoke, such as ashtrays and lighters. Clean your car. Make sure to empty the ashtray. Clean your home, including curtains and carpets. What can I do to help me quit smoking? Talk with your doctor about taking medicines and seeing a counselor at the same time. You are more likely to succeed when you do both. If you are pregnant or breastfeeding, talk with your doctor about counseling or other ways to quit smoking. Do not take medicine to help you quit smoking unless your doctor tells you to do so. To quit smoking: Quit right away Quit smoking totally, instead of slowly cutting back on how much you smoke over a period of time. Go to counseling. You are more likely to quit if you go to counseling sessions  regularly. Take medicine You may take medicines to help you quit. Some medicines need a prescription, and some you can buy over-the-counter. Some medicines may contain a drug called nicotine to replace the nicotine in cigarettes. Medicines may: Help you to stop having the desire to smoke (cravings). Help to stop the problems that come when you stop smoking (withdrawal symptoms). Your doctor may ask you to use: Nicotine patches, gum, or lozenges. Nicotine inhalers or sprays. Non-nicotine medicine that is taken by mouth. Find resources Find resources and other ways to help you quit smoking and remain smoke-free after you quit. These resources are most helpful when you use them often. They include: Online chats with a Social worker. Phone quitlines. Printed Furniture conservator/restorer. Support groups or group counseling. Text messaging programs. Mobile phone apps. Use apps  on your mobile phone or tablet that can help you stick to your quit plan. There are many free apps for mobile phones and tablets as well as websites. Examples include Quit Guide from the [employer] and smokefree.gov  What things can I do to make it easier to quit?  Talk to your family and friends. Ask them to support and encourage you. Call a phone quitline (1-800-QUIT-NOW), reach out to support groups, or work with a Social worker. Ask people who smoke to not smoke around you. Avoid places that make you want to smoke, such as: Bars. Parties. Smoke-break areas at work. Spend time with people who do not smoke. Lower the stress in your life. Stress can make you want to smoke. Try these things to help your stress: Getting regular exercise. Doing deep-breathing exercises. Doing yoga. Meditating. Doing a body scan. To do this, close your eyes, focus on one area of your body at a time from head to toe. Notice which parts of your body are tense. Try to relax the muscles in those areas. How will I feel when I quit smoking? Day 1 to 3  weeks Within the first 24 hours, you may start to have some problems that come from quitting tobacco. These problems are very bad 2-3 days after you quit, but they do not often last for more than 2-3 weeks. You may get these symptoms: Mood swings. Feeling restless, nervous, angry, or annoyed. Trouble concentrating. Dizziness. Strong desire for high-sugar foods and nicotine. Weight gain. Trouble pooping (constipation). Feeling like you may vomit (nausea). Coughing or a sore throat. Changes in how the medicines that you take for other issues work in your body. Depression. Trouble sleeping (insomnia). Week 3 and afterward After the first 2-3 weeks of quitting, you may start to notice more positive results, such as: Better sense of smell and taste. Less coughing and sore throat. Slower heart rate. Lower blood pressure. Clearer skin. Better breathing. Fewer sick days. Quitting smoking can be hard. Do not give up if you fail the first time. Some people need to try a few times before they succeed. Do your best to stick to your quit plan, and talk with your doctor if you have any questions or concerns. Summary Smoking tobacco is the leading cause of preventable death. Quitting smoking can be hard, but it is one of the best things that you can do for your health. When you decide to quit smoking, make a plan to help you succeed. Quit smoking right away, not slowly over a period of time. When you start quitting, seek help from your doctor, family, or friends. This information is not intended to replace advice given to you by your health care provider. Make sure you discuss any questions you have with your health care provider. Document Revised: 01/29/2021 Document Reviewed: 08/11/2018 Elsevier Patient Education  [place].

## 2021-06-10 ENCOUNTER — Emergency Department (HOSPITAL_BASED_OUTPATIENT_CLINIC_OR_DEPARTMENT_OTHER): Payer: Medicare Other

## 2021-06-10 ENCOUNTER — Encounter (HOSPITAL_BASED_OUTPATIENT_CLINIC_OR_DEPARTMENT_OTHER): Payer: Self-pay | Admitting: Emergency Medicine

## 2021-06-10 ENCOUNTER — Other Ambulatory Visit: Payer: Self-pay

## 2021-06-10 ENCOUNTER — Observation Stay (HOSPITAL_BASED_OUTPATIENT_CLINIC_OR_DEPARTMENT_OTHER)
Admission: EM | Admit: 2021-06-10 | Discharge: 2021-06-12 | Disposition: A | Discharge status: Home / Self Care | Payer: Medicare Other | Attending: Internal Medicine | Admitting: Internal Medicine

## 2021-06-10 DIAGNOSIS — J40 Bronchitis, not specified as acute or chronic: Secondary | ICD-10-CM | POA: Diagnosis present

## 2021-06-10 DIAGNOSIS — E871 Hypo-osmolality and hyponatremia: Secondary | ICD-10-CM | POA: Diagnosis present

## 2021-06-10 DIAGNOSIS — J449 Chronic obstructive pulmonary disease, unspecified: Secondary | ICD-10-CM | POA: Diagnosis not present

## 2021-06-10 DIAGNOSIS — I2510 Atherosclerotic heart disease of native coronary artery without angina pectoris: Secondary | ICD-10-CM | POA: Diagnosis present

## 2021-06-10 DIAGNOSIS — J209 Acute bronchitis, unspecified: Secondary | ICD-10-CM | POA: Diagnosis not present

## 2021-06-10 DIAGNOSIS — F17210 Nicotine dependence, cigarettes, uncomplicated: Secondary | ICD-10-CM | POA: Diagnosis not present

## 2021-06-10 DIAGNOSIS — E785 Hyperlipidemia, unspecified: Secondary | ICD-10-CM | POA: Diagnosis present

## 2021-06-10 DIAGNOSIS — I509 Heart failure, unspecified: Secondary | ICD-10-CM | POA: Diagnosis not present

## 2021-06-10 DIAGNOSIS — Z7982 Long term (current) use of aspirin: Secondary | ICD-10-CM | POA: Insufficient documentation

## 2021-06-10 DIAGNOSIS — Z79899 Other long term (current) drug therapy: Secondary | ICD-10-CM | POA: Diagnosis not present

## 2021-06-10 DIAGNOSIS — I110 Hypertensive heart disease with heart failure: Secondary | ICD-10-CM | POA: Insufficient documentation

## 2021-06-10 DIAGNOSIS — R7989 Other specified abnormal findings of blood chemistry: Secondary | ICD-10-CM | POA: Diagnosis present

## 2021-06-10 DIAGNOSIS — Z7902 Long term (current) use of antithrombotics/antiplatelets: Secondary | ICD-10-CM | POA: Insufficient documentation

## 2021-06-10 DIAGNOSIS — I5041 Acute combined systolic (congestive) and diastolic (congestive) heart failure: Secondary | ICD-10-CM | POA: Diagnosis not present

## 2021-06-10 DIAGNOSIS — Z20822 Contact with and (suspected) exposure to covid-19: Secondary | ICD-10-CM | POA: Diagnosis not present

## 2021-06-10 DIAGNOSIS — I10 Essential (primary) hypertension: Secondary | ICD-10-CM | POA: Diagnosis not present

## 2021-06-10 DIAGNOSIS — J42 Unspecified chronic bronchitis: Secondary | ICD-10-CM | POA: Diagnosis present

## 2021-06-10 DIAGNOSIS — R0602 Shortness of breath: Secondary | ICD-10-CM | POA: Diagnosis not present

## 2021-06-10 DIAGNOSIS — Z720 Tobacco use: Secondary | ICD-10-CM | POA: Diagnosis present

## 2021-06-10 DIAGNOSIS — R778 Other specified abnormalities of plasma proteins: Secondary | ICD-10-CM | POA: Diagnosis present

## 2021-06-10 DIAGNOSIS — I5033 Acute on chronic diastolic (congestive) heart failure: Secondary | ICD-10-CM | POA: Insufficient documentation

## 2021-06-10 LAB — CBC WITH DIFFERENTIAL/PLATELET
Abs Immature Granulocytes: 0.04 10*3/uL (ref 0.00–0.07)
Basophils Absolute: 0 10*3/uL (ref 0.0–0.1)
Basophils Relative: 0 %
Eosinophils Absolute: 0.2 10*3/uL (ref 0.0–0.5)
Eosinophils Relative: 2 %
HCT: 38.8 % (ref 36.0–46.0)
Hemoglobin: 12.5 g/dL (ref 12.0–15.0)
Immature Granulocytes: 0 %
Lymphocytes Relative: 14 %
Lymphs Abs: 1.7 10*3/uL (ref 0.7–4.0)
MCH: 28.9 pg (ref 26.0–34.0)
MCHC: 32.2 g/dL (ref 30.0–36.0)
MCV: 89.6 fL (ref 80.0–100.0)
Monocytes Absolute: 0.7 10*3/uL (ref 0.1–1.0)
Monocytes Relative: 6 %
Neutro Abs: 9.3 10*3/uL — ABNORMAL HIGH (ref 1.7–7.7)
Neutrophils Relative %: 78 %
Platelets: 259 10*3/uL (ref 150–400)
RBC: 4.33 MIL/uL (ref 3.87–5.11)
RDW: 14.9 % (ref 11.5–15.5)
WBC: 11.9 10*3/uL — ABNORMAL HIGH (ref 4.0–10.5)
nRBC: 0 % (ref 0.0–0.2)

## 2021-06-10 LAB — BASIC METABOLIC PANEL
Anion gap: 8 (ref 5–15)
BUN: 14 mg/dL (ref 8–23)
CO2: 25 mmol/L (ref 22–32)
Calcium: 9.2 mg/dL (ref 8.9–10.3)
Chloride: 101 mmol/L (ref 98–111)
Creatinine, Ser: 0.9 mg/dL (ref 0.44–1.00)
GFR, Estimated: >60 mL/min (ref >60)
Glucose, Bld: 110 mg/dL — ABNORMAL HIGH (ref 70–99)
Potassium: 3.9 mmol/L (ref 3.5–5.1)
Sodium: 134 mmol/L — ABNORMAL LOW (ref 135–145)

## 2021-06-10 LAB — BRAIN NATRIURETIC PEPTIDE: B Natriuretic Peptide: 451.4 pg/mL — ABNORMAL HIGH (ref 0.0–100.0)

## 2021-06-10 LAB — RESP PANEL BY RT-PCR (FLU A&B, COVID) ARPGX2
Influenza A by PCR: NEGATIVE
Influenza B by PCR: NEGATIVE
SARS Coronavirus 2 by RT PCR: NEGATIVE

## 2021-06-10 LAB — TROPONIN I (HIGH SENSITIVITY)
Troponin I (High Sensitivity): 39 ng/L — ABNORMAL HIGH (ref <18)
Troponin I (High Sensitivity): 48 ng/L — ABNORMAL HIGH (ref <18)
Troponin I (High Sensitivity): 71 ng/L — ABNORMAL HIGH (ref <18)

## 2021-06-10 MED ORDER — ALBUTEROL SULFATE HFA 108 (90 BASE) MCG/ACT IN AERS
INHALATION_SPRAY | RESPIRATORY_TRACT | Status: AC
  Administered 2021-06-10: 2 via RESPIRATORY_TRACT
  Filled 2021-06-10: qty 6.7

## 2021-06-10 MED ORDER — IPRATROPIUM-ALBUTEROL 0.5-2.5 (3) MG/3ML IN SOLN
3.0000 mL | Freq: Once | RESPIRATORY_TRACT | Status: AC
  Administered 2021-06-10: 3 mL via RESPIRATORY_TRACT
  Filled 2021-06-10: qty 3

## 2021-06-10 MED ORDER — ALBUTEROL SULFATE (2.5 MG/3ML) 0.083% IN NEBU
2.5000 mg | INHALATION_SOLUTION | RESPIRATORY_TRACT | Status: DC | PRN
  Administered 2021-06-10: 2.5 mg via RESPIRATORY_TRACT

## 2021-06-10 MED ORDER — IPRATROPIUM-ALBUTEROL 0.5-2.5 (3) MG/3ML IN SOLN
RESPIRATORY_TRACT | Status: AC
  Administered 2021-06-10: 3 mL via RESPIRATORY_TRACT
  Filled 2021-06-10: qty 3

## 2021-06-10 MED ORDER — IPRATROPIUM-ALBUTEROL 0.5-2.5 (3) MG/3ML IN SOLN
3.0000 mL | Freq: Once | RESPIRATORY_TRACT | Status: AC
  Administered 2021-06-10: 3 mL via RESPIRATORY_TRACT

## 2021-06-10 MED ORDER — DOXYCYCLINE HYCLATE 100 MG PO TABS
100.0000 mg | ORAL_TABLET | Freq: Once | ORAL | Status: AC
  Administered 2021-06-10: 100 mg via ORAL

## 2021-06-10 MED ORDER — ALBUTEROL SULFATE (2.5 MG/3ML) 0.083% IN NEBU: 2.5000 mg | INHALATION_SOLUTION | Freq: Once | RESPIRATORY_TRACT | Status: DC

## 2021-06-10 MED ORDER — NITROGLYCERIN 2 % TD OINT
1.0000 [in_us] | TOPICAL_OINTMENT | Freq: Once | TRANSDERMAL | Status: AC
  Administered 2021-06-10: 1 [in_us] via TOPICAL
  Filled 2021-06-10: qty 1

## 2021-06-10 MED ORDER — AEROCHAMBER PLUS FLO-VU MEDIUM MISC
1.0000 | Freq: Once | Status: AC
  Administered 2021-06-10: 1
  Filled 2021-06-10: qty 1

## 2021-06-10 MED ORDER — ASPIRIN 81 MG PO CHEW
324.0000 mg | CHEWABLE_TABLET | Freq: Once | ORAL | Status: AC
  Administered 2021-06-10: 324 mg via ORAL
  Filled 2021-06-10: qty 4

## 2021-06-10 MED ORDER — FUROSEMIDE 10 MG/ML IJ SOLN
40.0000 mg | Freq: Once | INTRAMUSCULAR | Status: AC
  Administered 2021-06-10: 40 mg via INTRAVENOUS
  Filled 2021-06-10: qty 4

## 2021-06-10 MED ORDER — ALBUTEROL SULFATE HFA 108 (90 BASE) MCG/ACT IN AERS: 2.0000 | INHALATION_SPRAY | Freq: Once | RESPIRATORY_TRACT | Status: DC

## 2021-06-10 MED ORDER — ALBUTEROL SULFATE HFA 108 (90 BASE) MCG/ACT IN AERS: 2.0000 | INHALATION_SPRAY | Freq: Once | RESPIRATORY_TRACT | Status: AC

## 2021-06-10 NOTE — ED Provider Notes (Addendum)
Suspected new onset CHF.  Follow-up second troponin.  Patient getting a dose of Lasix now.  Reassess.  Anticipate admission. Physical Exam  BP 134/69    Pulse (!) 58    Temp 97.8 F (36.6 C) (Oral)    Resp 15    Ht 5\' 2"  (1.575 m)    Wt 81.6 kg    SpO2 93%    BMI 32.92 kg/m   Physical Exam  Procedures  Procedures  ED Course / MDM    Medical Decision Making Consult: Reviewed with Dr. Lorin Mercy.  Will continue to manage for CHF exacerbation based on prior diastolic dysfunction with Lasix and follow troponins.  If patient is stable and clinically appropriate for discharge.  Will consider discharge with expeditious follow-up.  Admission to be determined based on response to treatment and completion of diagnostic evaluation.  Patient has subjective improvement after diuresis.  Clinically she does continue to appear mildly dyspneic at rest with tachypnea.  Mild crackles on exam.  She also has persistent cough.  Review chest x-ray includes differential for infiltrate.  Patient has had fairly longstanding cough we will add doxycycline for element of bronchitis and continue albuterol therapy.  Troponins continue to elevate mildly.  Patient has not had a large elevation to suggest NSTEMI.  She was given aspirin and nitroglycerin ointment on arrival along with Lasix.  With continued climb of troponins, suspect an element of demand ischemia.  Reviewing the patient's history she describes significant orthopnea precipitating her presentation to the emergency department.  At this time we will continue to plan for admission for CHF exacerbation and add treatment for component of bronchitis.        Charlesetta Shanks, MD 06/10/21 8502    Charlesetta Shanks, MD 06/10/21 1540

## 2021-06-10 NOTE — ED Provider Notes (Addendum)
[facility] EMERGENCY DEPT Provider Note   CSN: [id number] Arrival date & time: 06/10/21  0559     History  Chief Complaint  Patient presents with   Shortness of Breath    Terri [name] is a 76 y.o. female.  The history is provided by the patient.  Shortness of Breath She has history of hypertension, hyperlipidemia, ischemic cardiomyopathy, coronary artery disease, tobacco abuse and comes in because of cough and dyspnea for the last 2 weeks.  Cough is productive of clear sputum.  Dyspnea is predominantly present when she is laying flat.  She has had paroxysmal nocturnal dyspnea.  She has not noted any dyspnea with exertion.  She denies fever or chills but has had some sweats.  She denies any nausea or vomiting.  She does endorse some heaviness in her chest.  She has been using over-the-counter cough medication and Primatene Mist with slight, temporary improvement.   Home Medications Prior to Admission medications   Medication Sig Start Date End Date Taking? Authorizing Provider  albuterol (PROVENTIL HFA;VENTOLIN HFA) 108 (90 Base) MCG/ACT inhaler Inhale 1-2 puffs into the lungs every 6 (six) hours as needed for wheezing or shortness of breath. And or cough. 10/26/17   Libby Maw, MD  atorvastatin (LIPITOR) 80 MG tablet Take 1 tablet (80 mg total) by mouth daily. 07/31/20   Lorretta Harp, MD  carvedilol (COREG) 6.25 MG tablet TAKE 1 TABLET BY MOUTH TWICE DAILY 07/31/20   Lorretta Harp, MD  chlorthalidone (HYGROTON) 25 MG tablet Take 0.5 tablets (12.5 mg total) by mouth daily. 07/31/20 07/26/21  Lorretta Harp, MD  clopidogrel (PLAVIX) 75 MG tablet Take 1 tablet (75 mg total) by mouth daily. 07/31/20   Lorretta Harp, MD  [facility] ASPIRIN 81 MG EC tablet Take 81 mg by mouth daily. 02/02/15   [provider]  diazepam (VALIUM) 5 MG tablet TAKE 2 TABs PO 30 MINS BEFORE MRI 08/21/20   Marybelle Killings, MD  gabapentin (NEURONTIN) 100 MG capsule One at night  times 5 days then 2 po q HS 09/16/20   Marybelle Killings, MD  lisinopril (ZESTRIL) 40 MG tablet Take 1 tablet (40 mg total) by mouth daily. 05/20/21   Deberah Pelton, NP  pantoprazole (PROTONIX) 20 MG tablet Take 1 tablet (20 mg total) by mouth daily. 04/14/21   Deberah Pelton, NP  predniSONE (STERAPRED UNI-PAK 21 TAB) 10 MG (21) TBPK tablet Take 6 tablets the first day with food then one less tablet each day, 5,4,3,2,1 then stop. 07/24/20   Marybelle Killings, MD  valACYclovir (VALTREX) 1000 MG tablet Take 1/2 tablet three times daily for 7 days. 04/01/21   Libby Maw, MD      Allergies    Erythromycin    Review of Systems   Review of Systems  Respiratory:  Positive for shortness of breath.   All other systems reviewed and are negative.  Physical Exam Updated Vital Signs BP (!) 169/80    Pulse 78    Temp 97.8 F (36.6 C) (Oral)    Resp 19    Ht 5\' 2"  (1.575 m)    Wt 81.6 kg    SpO2 98%    BMI 32.92 kg/m  Physical Exam Vitals and nursing note reviewed.  76 year old female, resting comfortably and in no acute distress. Vital signs are significant for elevated blood pressure and respiratory rate. Oxygen saturation is 98%, which is normal. Head is normocephalic and  atraumatic. PERRLA, EOMI. Oropharynx is clear. Neck is nontender and supple without adenopathy or JVD. Back is nontender and there is no CVA tenderness. Lungs have bibasilar rales with coarse expiratory wheezes throughout. Chest is nontender. Heart has regular rate and rhythm without murmur. Abdomen is soft, flat, nontender. Extremities have no cyanosis or edema, full range of motion is present. Skin is warm and dry without rash. Neurologic: Mental status is normal, cranial nerves are intact, moves all extremities equally.  ED Results / Procedures / Treatments   Labs (all labs ordered are listed, but only abnormal results are displayed) Labs Reviewed  CBC WITH DIFFERENTIAL/PLATELET - Abnormal; Notable for the  following components:      Result Value   WBC 11.9 (*)    Neutro Abs 9.3 (*)    All other components within normal limits  BASIC METABOLIC PANEL - Abnormal; Notable for the following components:   Sodium 134 (*)    Glucose, Bld 110 (*)    All other components within normal limits  TROPONIN I (HIGH SENSITIVITY) - Abnormal; Notable for the following components:   Troponin I (High Sensitivity) 39 (*)    All other components within normal limits  RESP PANEL BY RT-PCR (FLU A&B, COVID) [id number]  BRAIN NATRIURETIC PEPTIDE    EKG EKG Interpretation  Date/Time:  Thursday June 10 2021 06:11:13 EST Ventricular Rate:  79 PR Interval:  181 QRS Duration: 99 QT Interval:  391 QTC Calculation: 449 R Axis:   10 Text Interpretation: Sinus rhythm Anterior infarct, old When compared with ECG of 07/15/2014, Anterior infarct , old is now present Confirmed by Delora Fuel ([id number]) on 06/10/2021 6:34:46 AM  Radiology DG Chest Portable 1 View  Result Date: 06/10/2021 CLINICAL DATA:  Shortness of breath. EXAM: PORTABLE CHEST 1 VIEW COMPARISON:  10/11/2017 FINDINGS: 0627 hours. The cardio pericardial silhouette is enlarged. Bibasilar atelectasis or infiltrate noted. No overt pulmonary edema or substantial pleural effusion. The visualized bony structures of the thorax show no acute abnormality. Telemetry leads overlie the chest. IMPRESSION: Bibasilar atelectasis or infiltrate. No edema or substantial pleural effusion. Electronically Signed   By: Misty Stanley M.D.   On: 06/10/2021 06:38    Procedures Procedures    Medications Ordered in ED Medications  ipratropium-albuterol (DUONEB) 0.5-2.5 (3) MG/3ML nebulizer solution 3 mL (has no administration in time range)    ED Course/ Medical Decision Making/ A&P                           Medical Decision Making  Cough and dyspnea likely secondary to respiratory infection such as pneumonia or bronchitis.  Symptoms being present when supine is concerning for  heart failure, will need to check chest x-ray and BNP to rule out pneumonia and heart failure.  She will be given therapeutic trial of albuterol with ipratropium.  Old records are reviewed showing outpatient management of coronary artery disease and ischemic cardiomyopathy.  Echocardiogram on 04/28/2021 showed normal ejection fraction but grade 2 diastolic dysfunction.  Chest x-ray shows increased cardiac silhouette.  Radiologist read bibasilar infiltrate versus atelectasis, my reading is concerning for heart failure.  Troponin has come back mildly elevated at 39, will need to check delta troponin.  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  Mild hyponatremia is present not felt to be clinically significant.  BNP is still pending.    BNP has come back  significantly elevated, she is given a dose of furosemide and topical nitroglycerin.  She had no symptomatic improvement with albuterol with ipratropium, and lung exam is unchanged.  Symptoms appear to be entirely secondary to heart failure.  At this point, anticipate need for hospital admission.  Case is signed out to Dr. Johnney Killian.  CRITICAL CARE Performed by: Delora Fuel Total critical care time: 45 minutes Critical care time was exclusive of separately billable procedures and treating other patients. Critical care was necessary to treat or prevent imminent or life-threatening deterioration. Critical care was time spent personally by me on the following activities: development of treatment plan with patient and/or surrogate as well as nursing, discussions with consultants, evaluation of patient's response to treatment, examination of patient, obtaining history from patient or surrogate, ordering and performing treatments and interventions, ordering and review of laboratory studies, ordering and review of radiographic studies, pulse oximetry and re-evaluation of patient's condition.      Final  Clinical Impression(s) / ED Diagnoses Final diagnoses:  Acute heart failure, unspecified heart failure type ([place])  Elevated troponin  Hyponatremia    Rx / DC Orders ED Discharge Orders     None         Delora Fuel, MD 16/10/96 0454    Delora Fuel, MD 09/81/19 [id number]

## 2021-06-10 NOTE — ED Notes (Signed)
RT educated pt on smoking cessation and the importance of stopping for improved health. Pt states she is trying and understands. RT suggested pt seek evaluation by pulmonologist for PFT. Pt verbalized understanding. RT will continue to monitor.

## 2021-06-10 NOTE — ED Notes (Signed)
Patient instructed on the use of MDI with a spacer. Patient demonstrated good effort and understanding. Light green also drawn from right IV site and taken to lab.

## 2021-06-10 NOTE — ED Notes (Signed)
Ambulated patient 100 feet, no assistance needed. Pt sats remained 93% on room air.

## 2021-06-10 NOTE — Progress Notes (Signed)
Plan of Care Note for accepted transfer   Patient: Terri [name] MRN: [id number]   DOA: 06/10/2021  Facility requesting transfer: [facility] Requesting Provider: Phifer Reason for transfer: Patient with h/o grade 2 diastolic dysfunction on echo in 04/2021; CAD; tobacco dependence; and HLD presenting with SOB, cough.  Appears to have CHF exacerbation now.  2nd troponin is pending.   Facility course:  Given Lasix 40 mg x 1.  Currently on room air.  Awaiting 2nd troponin.  If possible to tune up and discharge with short-term outpatient cardiology f/u, this is recommended.  Otherwise will place in observation status at [facility].  Plan of care: The patient is accepted for admission to Telemetry unit, at [facility]..    Author: Karmen Bongo, MD 06/10/2021  Check [email or web address] for on-call coverage.  Nursing staff, Please call [place] number on Amion as soon as patient's arrival, so appropriate admitting provider can evaluate the pt.

## 2021-06-10 NOTE — ED Triage Notes (Signed)
°  Patient comes in with SOB on and off for a couple weeks.  Patient endorses bilateral ankle swelling and SOB with exertion.  Doesn't take any diuretics.  Was exposed to [place] within the last week.  No fevers. No N/V.  No pain.

## 2021-06-11 ENCOUNTER — Encounter (HOSPITAL_COMMUNITY): Payer: Self-pay | Admitting: Internal Medicine

## 2021-06-11 ENCOUNTER — Observation Stay (HOSPITAL_BASED_OUTPATIENT_CLINIC_OR_DEPARTMENT_OTHER): Payer: Medicare Other

## 2021-06-11 DIAGNOSIS — R0602 Shortness of breath: Secondary | ICD-10-CM | POA: Diagnosis present

## 2021-06-11 DIAGNOSIS — R778 Other specified abnormalities of plasma proteins: Secondary | ICD-10-CM

## 2021-06-11 DIAGNOSIS — E871 Hypo-osmolality and hyponatremia: Secondary | ICD-10-CM | POA: Diagnosis not present

## 2021-06-11 DIAGNOSIS — I5033 Acute on chronic diastolic (congestive) heart failure: Secondary | ICD-10-CM

## 2021-06-11 DIAGNOSIS — J449 Chronic obstructive pulmonary disease, unspecified: Secondary | ICD-10-CM | POA: Diagnosis not present

## 2021-06-11 DIAGNOSIS — E669 Obesity, unspecified: Secondary | ICD-10-CM | POA: Diagnosis not present

## 2021-06-11 DIAGNOSIS — J440 Chronic obstructive pulmonary disease with acute lower respiratory infection: Secondary | ICD-10-CM | POA: Diagnosis not present

## 2021-06-11 DIAGNOSIS — Z20822 Contact with and (suspected) exposure to covid-19: Secondary | ICD-10-CM | POA: Diagnosis not present

## 2021-06-11 DIAGNOSIS — I5041 Acute combined systolic (congestive) and diastolic (congestive) heart failure: Secondary | ICD-10-CM | POA: Diagnosis not present

## 2021-06-11 DIAGNOSIS — J40 Bronchitis, not specified as acute or chronic: Secondary | ICD-10-CM

## 2021-06-11 DIAGNOSIS — E782 Mixed hyperlipidemia: Secondary | ICD-10-CM | POA: Diagnosis not present

## 2021-06-11 DIAGNOSIS — Z7902 Long term (current) use of antithrombotics/antiplatelets: Secondary | ICD-10-CM | POA: Diagnosis not present

## 2021-06-11 DIAGNOSIS — I2510 Atherosclerotic heart disease of native coronary artery without angina pectoris: Secondary | ICD-10-CM | POA: Diagnosis not present

## 2021-06-11 DIAGNOSIS — Z79899 Other long term (current) drug therapy: Secondary | ICD-10-CM | POA: Diagnosis not present

## 2021-06-11 DIAGNOSIS — Z720 Tobacco use: Secondary | ICD-10-CM

## 2021-06-11 DIAGNOSIS — J209 Acute bronchitis, unspecified: Secondary | ICD-10-CM | POA: Diagnosis not present

## 2021-06-11 DIAGNOSIS — I110 Hypertensive heart disease with heart failure: Secondary | ICD-10-CM | POA: Diagnosis not present

## 2021-06-11 DIAGNOSIS — Z7982 Long term (current) use of aspirin: Secondary | ICD-10-CM | POA: Diagnosis not present

## 2021-06-11 DIAGNOSIS — F17210 Nicotine dependence, cigarettes, uncomplicated: Secondary | ICD-10-CM | POA: Diagnosis not present

## 2021-06-11 DIAGNOSIS — J42 Unspecified chronic bronchitis: Secondary | ICD-10-CM | POA: Diagnosis present

## 2021-06-11 LAB — CBC WITH DIFFERENTIAL/PLATELET
Abs Immature Granulocytes: 0.04 10*3/uL (ref 0.00–0.07)
Basophils Absolute: 0 10*3/uL (ref 0.0–0.1)
Basophils Relative: 1 %
Eosinophils Absolute: 0.1 10*3/uL (ref 0.0–0.5)
Eosinophils Relative: 1 %
HCT: 39 % (ref 36.0–46.0)
Hemoglobin: 12.6 g/dL (ref 12.0–15.0)
Immature Granulocytes: 1 %
Lymphocytes Relative: 23 %
Lymphs Abs: 1.8 10*3/uL (ref 0.7–4.0)
MCH: 29 pg (ref 26.0–34.0)
MCHC: 32.3 g/dL (ref 30.0–36.0)
MCV: 89.9 fL (ref 80.0–100.0)
Monocytes Absolute: 0.4 10*3/uL (ref 0.1–1.0)
Monocytes Relative: 5 %
Neutro Abs: 5.5 10*3/uL (ref 1.7–7.7)
Neutrophils Relative %: 69 %
Platelets: 238 10*3/uL (ref 150–400)
RBC: 4.34 MIL/uL (ref 3.87–5.11)
RDW: 14.9 % (ref 11.5–15.5)
WBC: 7.9 10*3/uL (ref 4.0–10.5)
nRBC: 0 % (ref 0.0–0.2)

## 2021-06-11 LAB — COMPREHENSIVE METABOLIC PANEL
ALT: 15 U/L (ref 0–44)
AST: 11 U/L — ABNORMAL LOW (ref 15–41)
Albumin: 3.6 g/dL (ref 3.5–5.0)
Alkaline Phosphatase: 112 U/L (ref 38–126)
Anion gap: 9 (ref 5–15)
BUN: 12 mg/dL (ref 8–23)
CO2: 24 mmol/L (ref 22–32)
Calcium: 9.1 mg/dL (ref 8.9–10.3)
Chloride: 105 mmol/L (ref 98–111)
Creatinine, Ser: 0.89 mg/dL (ref 0.44–1.00)
GFR, Estimated: >60 mL/min (ref >60)
Glucose, Bld: 107 mg/dL — ABNORMAL HIGH (ref 70–99)
Potassium: 4.1 mmol/L (ref 3.5–5.1)
Sodium: 138 mmol/L (ref 135–145)
Total Bilirubin: 0.5 mg/dL (ref 0.3–1.2)
Total Protein: 6.5 g/dL (ref 6.5–8.1)

## 2021-06-11 LAB — ECHOCARDIOGRAM LIMITED
Height: 62 in
Weight: 2804.8 oz

## 2021-06-11 LAB — TSH: TSH: 1.402 u[IU]/mL (ref 0.350–4.500)

## 2021-06-11 LAB — PROCALCITONIN: Procalcitonin: <0.1 ng/mL

## 2021-06-11 LAB — MAGNESIUM: Magnesium: 2.3 mg/dL (ref 1.7–2.4)

## 2021-06-11 MED ORDER — ONDANSETRON HCL 4 MG/2ML IJ SOLN: 4.0000 mg | Freq: Four times a day (QID) | INTRAMUSCULAR | Status: DC | PRN

## 2021-06-11 MED ORDER — ALBUTEROL SULFATE (2.5 MG/3ML) 0.083% IN NEBU
2.5000 mg | INHALATION_SOLUTION | Freq: Two times a day (BID) | RESPIRATORY_TRACT | Status: DC
  Administered 2021-06-11 – 2021-06-12 (×2): 2.5 mg via RESPIRATORY_TRACT
  Filled 2021-06-11 (×3): qty 3

## 2021-06-11 MED ORDER — FUROSEMIDE 10 MG/ML IJ SOLN
40.0000 mg | Freq: Two times a day (BID) | INTRAMUSCULAR | Status: DC
  Administered 2021-06-11: 40 mg via INTRAVENOUS
  Filled 2021-06-11: qty 4

## 2021-06-11 MED ORDER — ATORVASTATIN CALCIUM 80 MG PO TABS
80.0000 mg | ORAL_TABLET | Freq: Every evening | ORAL | Status: DC
Start: 2021-06-11 — End: 2021-06-12
  Administered 2021-06-11: 80 mg via ORAL
  Filled 2021-06-11: qty 1

## 2021-06-11 MED ORDER — ACETAMINOPHEN 325 MG PO TABS: 650.0000 mg | ORAL_TABLET | ORAL | Status: DC | PRN

## 2021-06-11 MED ORDER — DOXYCYCLINE HYCLATE 100 MG PO TABS
100.0000 mg | ORAL_TABLET | Freq: Two times a day (BID) | ORAL | Status: DC
  Administered 2021-06-11 – 2021-06-12 (×3): 100 mg via ORAL
  Filled 2021-06-11 (×3): qty 1

## 2021-06-11 MED ORDER — ENOXAPARIN SODIUM 40 MG/0.4ML IJ SOSY
40.0000 mg | PREFILLED_SYRINGE | INTRAMUSCULAR | Status: DC
  Administered 2021-06-11: 40 mg via SUBCUTANEOUS
  Filled 2021-06-11 (×2): qty 0.4

## 2021-06-11 MED ORDER — SODIUM CHLORIDE 0.9 % IV SOLN: 250.0000 mL | INTRAVENOUS | Status: DC | PRN

## 2021-06-11 MED ORDER — PERFLUTREN LIPID MICROSPHERE
1.0000 mL | INTRAVENOUS | Status: AC | PRN
  Administered 2021-06-11: 2 mL via INTRAVENOUS
  Filled 2021-06-11: qty 10

## 2021-06-11 MED ORDER — ASPIRIN EC 81 MG PO TBEC
81.0000 mg | DELAYED_RELEASE_TABLET | Freq: Every day | ORAL | Status: DC
  Administered 2021-06-11 – 2021-06-12 (×2): 81 mg via ORAL
  Filled 2021-06-11 (×2): qty 1

## 2021-06-11 MED ORDER — SODIUM CHLORIDE 0.9% FLUSH: 3.0000 mL | INTRAVENOUS | Status: DC | PRN

## 2021-06-11 MED ORDER — SODIUM CHLORIDE 0.9% FLUSH
3.0000 mL | Freq: Two times a day (BID) | INTRAVENOUS | Status: DC
  Administered 2021-06-11 – 2021-06-12 (×3): 3 mL via INTRAVENOUS

## 2021-06-11 MED ORDER — CARVEDILOL 6.25 MG PO TABS
6.2500 mg | ORAL_TABLET | Freq: Two times a day (BID) | ORAL | Status: DC
  Administered 2021-06-11 – 2021-06-12 (×3): 6.25 mg via ORAL
  Filled 2021-06-11 (×3): qty 1

## 2021-06-11 MED ORDER — LISINOPRIL 20 MG PO TABS
40.0000 mg | ORAL_TABLET | Freq: Every day | ORAL | Status: DC
  Administered 2021-06-11 – 2021-06-12 (×2): 40 mg via ORAL
  Filled 2021-06-11 (×2): qty 2

## 2021-06-11 MED ORDER — PANTOPRAZOLE SODIUM 20 MG PO TBEC
20.0000 mg | DELAYED_RELEASE_TABLET | Freq: Every day | ORAL | Status: DC
  Administered 2021-06-11 – 2021-06-12 (×2): 20 mg via ORAL
  Filled 2021-06-11 (×2): qty 1

## 2021-06-11 MED ORDER — PREDNISONE 20 MG PO TABS
40.0000 mg | ORAL_TABLET | Freq: Every day | ORAL | Status: DC
  Administered 2021-06-11 – 2021-06-12 (×2): 40 mg via ORAL
  Filled 2021-06-11 (×2): qty 2

## 2021-06-11 MED ORDER — CLOPIDOGREL BISULFATE 75 MG PO TABS
75.0000 mg | ORAL_TABLET | Freq: Every day | ORAL | Status: DC
  Administered 2021-06-11 – 2021-06-12 (×2): 75 mg via ORAL
  Filled 2021-06-11 (×2): qty 1

## 2021-06-11 NOTE — Progress Notes (Signed)
SATURATION QUALIFICATIONS: (This note is used to comply with regulatory documentation for home oxygen)  Patient Saturations on Room Air at Rest = 96%  Patient Saturations on Room Air while Ambulating = 91%

## 2021-06-11 NOTE — ED Notes (Signed)
RT called to room per pt request. Pt states she is still SOB after alb x 2 puffs. RT ordered and administered duoneb which appears to help pt better than MDI. Pt finally resting Post treatment. Pt respiratory status stable on RA, sats 95%, w/no distress noted at this time. RT will continue to monitor.

## 2021-06-11 NOTE — Evaluation (Signed)
Physical Therapy Evaluation Patient Details Name: Terri [name] MRN: [id number] DOB: 1945-08-15 Today's Date: 06/11/2021  History of Present Illness  The pt is a 76 yo female presenting 1/5 with SOB and bilateral ankle swelling. Continued work up for respiratory infection vs cardiac source. PMH includes: HTN, HLD, aortic insufficiency, STEMI, ischemic cardiomyopathy, CAD, and current tobacco use.   Clinical Impression  Pt in bed upon arrival of PT, agreeable to evaluation at this time. Prior to admission the pt was completely independent, not using DME, and was able to complete all ADLs and IADLs without issue. The pt now presents with mild limitations in functional mobility, endurance, and dynamic stability, but was able to complete hallway ambulation and stairs with no UE support and no supplemental O2. The pt's SpO2 remained >94% with all activity on RA despite 3/4 DOE at times during conversation with walking. The pt was educated on general walking progress guided by activity tracking, HRm and SpO2 information provided by her apple watch.  The pt verbalized understanding of all recommendations, has good family support and access to DME such as canes and RW if needed at home. No further acute PT needs, is safe to return home with family when medically stable.    Recommendations for follow up therapy are one component of a multi-disciplinary discharge planning process, led by the attending physician.  Recommendations may be updated based on patient status, additional functional criteria and insurance authorization.  Follow Up Recommendations No PT follow up    Assistance Recommended at Discharge Intermittent Supervision/Assistance  Patient can return home with the following  Assistance with cooking/housework    Equipment Recommendations None recommended by PT  Recommendations for Other Services       Functional Status Assessment Patient has had a recent decline in their functional status and  demonstrates the ability to make significant improvements in function in a reasonable and predictable amount of time.     Precautions / Restrictions Precautions Precautions: None Restrictions Weight Bearing Restrictions: No      Mobility  Bed Mobility Overal bed mobility: Independent             General bed mobility comments: sitting EOB upon arrival    Transfers Overall transfer level: Independent Equipment used: None               General transfer comment: able to stand without assist or instability    Ambulation/Gait Ambulation/Gait assistance: Supervision Gait Distance (Feet): 200 Feet Assistive device: None Gait Pattern/deviations: Step-through pattern;Decreased stride length Gait velocity: 0.8 m/s Gait velocity interpretation: 1.31 - 2.62 ft/sec, indicative of limited community ambulator   General Gait Details: pt with no overt LOB or assist needed, intermittently reaching to touch rails or carts in hallway as we passed.  Stairs Stairs: Yes   Stair Management: One rail Left;Alternating pattern;Forwards Number of Stairs: 4 General stair comments: generally stable, SpO2 94-96%      Balance Overall balance assessment: Mild deficits observed, not formally tested                               Standardized Balance Assessment Standardized Balance Assessment : Dynamic Gait Index   Dynamic Gait Index Level Surface: Normal Change in Gait Speed: Normal Gait with Horizontal Head Turns: Normal Gait with Vertical Head Turns: Normal Gait and Pivot Turn: Mild Impairment Step Over Obstacle: Mild Impairment Step Around Obstacles: Normal Steps: Mild Impairment Total Score: 21  Pertinent Vitals/Pain Pain Assessment: No/denies pain    Home Living Family/patient expects to be discharged to:: Private residence Living Arrangements: Spouse/significant other Available Help at Discharge: Family;Available 24 hours/day (spouse and 2 adult  children) Type of Home: House Home Access: Stairs to enter Entrance Stairs-Rails: None Entrance Stairs-Number of Steps: 1   Home Layout: One level Home Equipment: Conservation officer, nature (2 wheels);Cane - single point;Shower seat;Grab bars - tub/shower Additional Comments: spouse currently using a crutch due to recent foot surgery    Prior Function Prior Level of Function : Independent/Modified Independent;Driving             Mobility Comments: pt reports fully independent, driving, assisting husband with mobility after his surgery. uses apple watch to track mobility states "normal day" is 3,000 steps and "active day" is 5,000-7,000 steps ADLs Comments: pt reports independence but growing difficulty with deep cleaning her house     Hand Dominance   Dominant Hand: Right    Extremity/Trunk Assessment   Upper Extremity Assessment Upper Extremity Assessment: Overall WFL for tasks assessed    Lower Extremity Assessment Lower Extremity Assessment: Overall WFL for tasks assessed    Cervical / Trunk Assessment Cervical / Trunk Assessment: Normal  Communication   Communication: No difficulties  Cognition Arousal/Alertness: Awake/alert Behavior During Therapy: WFL for tasks assessed/performed Overall Cognitive Status: Within Functional Limits for tasks assessed                                          General Comments General comments (skin integrity, edema, etc.): VSS on RA with all activity        PT Assessment Patient does not need any further PT services         PT Goals (Current goals can be found in the Care Plan section)  Acute Rehab PT Goals Patient Stated Goal: return home tonight PT Goal Formulation: With patient Time For Goal Achievement: 06/25/21 Potential to Achieve Goals: Good     AM-PAC PT "6 Clicks" Mobility  Outcome Measure Help needed turning from your back to your side while in a flat bed without using bedrails?: None Help needed  moving from lying on your back to sitting on the side of a flat bed without using bedrails?: None Help needed moving to and from a bed to a chair (including a wheelchair)?: None Help needed standing up from a chair using your arms (e.g., wheelchair or bedside chair)?: None Help needed to walk in hospital room?: None Help needed climbing 3-5 steps with a railing? : A Little 6 Click Score: 23    End of Session Equipment Utilized During Treatment: Gait belt Activity Tolerance: Patient tolerated treatment well Patient left: in bed;with call bell/phone within reach Nurse Communication: Mobility status PT Visit Diagnosis: Other abnormalities of gait and mobility (R26.89)    Time: 1601-0932 PT Time Calculation (min) (ACUTE ONLY): 19 min   Charges:   PT Evaluation $PT Eval Low Complexity: 1 Low          West Carbo, PT, DPT   Acute Rehabilitation Department Pager #: [phone]  Sandra Cockayne 06/11/2021, 5:55 PM

## 2021-06-11 NOTE — ED Notes (Signed)
Pt c/o shortnesss of breath & being uncomfortable. Pt requesting oxygen to be put on to see if it helps her to breath. Pt's room air sats are 97%, 2 liters of Oxygen applied via nasal canula per patient request. Will continue to monitor.

## 2021-06-11 NOTE — H&P (Signed)
History and Physical    MICHA ERCK EUM:[id number] DOB: 06/23/1945 DOA: 06/10/2021  Referring MD/NP/PA: Karmen Bongo, MD PCP: Libby Maw, MD  Consultants: Dr. Quay Burow- cardiology Patient coming from: Transfer from [facility]: Cough and shortness of breath  I have personally briefly reviewed patient's old medical records in [place]   HPI: Terri [name] is a 76 y.o. female with medical history significant of hyperlipidemia, CAD s/p stent x3, diastolic CHF last EF 54-00% with grade 2 diastolic dysfunction, HSV, tobacco abuse, and obesity presents with complaints of progressively worsening cough and shortness of breath over the last 3-4 week.  She complains of having a productive cough with mostly clear sputum production.  Patient notices the shortness of breath occurs mostly at night when trying to lay down at night and reports waking up due to feeling as though she cannot breathe.  Associated symptoms include wheezing, insomnia, unwarranted weight loss of approximately 7 pounds since her office visit with cardiology last month, and left foot pain with ankle swelling.  However, patient notes that the left foot pain resolved after changing to shoes with arch support.  Noted by the ED provider to endorse some chest heaviness.  Denies having any significant fever, chest pain, nausea, vomiting, abdominal pain, dysuria, or focal weakness.  She has continued to smoke 7-8 cigarettes/day on average, but reports having at least a having an approximate 60 smoking pack year history.  Patient has been using Primatene Mist with some temporary improvement in symptoms.  Normally she is not on oxygen, but states that it is helped her breathe.  Last reports being on steroids over a year ago.  ED Course: Upon admission to the emergency department patient had been noted to be afebrile, pulse 58-85, respirations 13-28, blood pressure 112/57-160 6/74, and O2 saturations  currently maintained on 2 L nasal cannula oxygen.  Patient was never noted to be hypoxic, but was placed on oxygen for comfort.  Labs since 1/5 no WBC 11.9, sodium 134, potassium 3.9, BNP 451.4, and high-sensitivity troponin 39-> 48-> 71.  Chest x-ray noted bibasilar infiltrate or atelectasis without signs of edema or pleural effusion.  Influenza and COVID-19 screening were negative.  Patient has been given full dose aspirin 324 mg, doxycycline., breathing treatments,  Review of Systems  Constitutional:  Positive for weight loss. Negative for fever.  HENT:  Negative for ear discharge and hearing loss.   Eyes:  Negative for photophobia and pain.  Respiratory:  Positive for cough, sputum production, shortness of breath and wheezing.   Cardiovascular:  Positive for orthopnea, leg swelling and PND. Negative for chest pain.  Gastrointestinal:  Negative for abdominal pain, diarrhea, nausea and vomiting.  Genitourinary:  Negative for dysuria and hematuria.  Musculoskeletal:  Positive for joint pain. Negative for falls.  Skin:  Negative for rash.  Neurological:  Negative for focal weakness and loss of consciousness.  Psychiatric/Behavioral:  Positive for substance abuse. The patient has insomnia.    Past Medical History:  Diagnosis Date   Aortic insufficiency    a. Mild AI by echo 2014.   Arthritis    CAD (coronary artery disease)    a. Anterior STEMI 01/2015 s/p overlapping DES x2 to prox LAD, DES to prox RCA in [facility].   Chronic back pain    stenosis/HNP   History of bronchitis 2014   History of colon polyps    Hyperlipidemia    Ischemic cardiomyopathy    Joint  pain    Joint swelling    Pneumonia 2014   Smokers' cough ([place])    Stress headaches    Tobacco abuse     Past Surgical History:  Procedure Laterality Date   ABDOMINAL HYSTERECTOMY     at age 45   BREAST BIOPSY Right    roughly 30 years ago with [facility]    cataract surgery Bilateral    CHOLECYSTECTOMY      colonscopy     ERCP     ESOPHAGOGASTRODUODENOSCOPY     LUMBAR LAMINECTOMY Right 07/25/2014   Procedure: Right L4 Hemilaminectomy, Right L4-5 Laminotomy , Decompression, Microdisectomy;  Surgeon: Marybelle Killings, MD;  Location: [facility];  Service: Orthopedics;  Laterality: Right;   SHOULDER SURGERY Right    TONSILLECTOMY     as a child     reports that she has been smoking cigarettes. She has a 25.00 pack-year smoking history. She has never used smokeless tobacco. She reports that she does not drink alcohol and does not use drugs.  Allergies  Allergen Reactions   Erythromycin     nauseated    Family History  Problem Relation Age of Onset   Heart disease Mother    Heart failure Mother    Heart disease Father    Heart failure Father    CAD Other        both parents and multiple siblings with either died or had-related issues   Breast cancer Sister     Prior to Admission medications   Medication Sig Start Date End Date Taking? Authorizing Provider  atorvastatin (LIPITOR) 80 MG tablet Take 1 tablet (80 mg total) by mouth daily. 07/31/20  Yes Lorretta Harp, MD  carvedilol (COREG) 6.25 MG tablet TAKE 1 TABLET BY MOUTH TWICE DAILY 07/31/20  Yes Lorretta Harp, MD  clopidogrel (PLAVIX) 75 MG tablet Take 1 tablet (75 mg total) by mouth daily. 07/31/20  Yes Lorretta Harp, MD  [facility] ASPIRIN 81 MG EC tablet Take 81 mg by mouth daily. 02/02/15  Yes [provider]  lisinopril (ZESTRIL) 40 MG tablet Take 1 tablet (40 mg total) by mouth daily. 05/20/21  Yes Deberah Pelton, NP  pantoprazole (PROTONIX) 20 MG tablet Take 1 tablet (20 mg total) by mouth daily. 04/14/21  Yes Cleaver, Jossie Ng, NP  albuterol (PROVENTIL HFA;VENTOLIN HFA) 108 (90 Base) MCG/ACT inhaler Inhale 1-2 puffs into the lungs every 6 (six) hours as needed for wheezing or shortness of breath. And or cough. 10/26/17   Libby Maw, MD  chlorthalidone (HYGROTON) 25 MG tablet Take 0.5 tablets (12.5 mg total) by mouth  daily. 07/31/20 07/26/21  Lorretta Harp, MD  diazepam (VALIUM) 5 MG tablet TAKE 2 TABs PO 30 MINS BEFORE MRI 08/21/20   Marybelle Killings, MD  gabapentin (NEURONTIN) 100 MG capsule One at night times 5 days then 2 po q HS 09/16/20   Marybelle Killings, MD  predniSONE (STERAPRED UNI-PAK 21 TAB) 10 MG (21) TBPK tablet Take 6 tablets the first day with food then one less tablet each day, 5,4,3,2,1 then stop. 07/24/20   Marybelle Killings, MD  valACYclovir (VALTREX) 1000 MG tablet Take 1/2 tablet three times daily for 7 days. 04/01/21   Libby Maw, MD    Physical Exam:  Constitutional: Elderly woman who appears to be in no acute distress at this Vitals:   06/10/21 2357 06/11/21 0100 06/11/21 0550 06/11/21 0606  BP: (!) 141/77 (!) 166/74 (!) 154/64  Pulse: 63 73 67   Resp: 13 (!) 21 (!) 21   Temp:    98 F (36.7 C)  TempSrc:    Oral  SpO2: 98% 98% 96%   Weight:    79.5 kg  Height:       Eyes: PERRL, lids and conjunctivae normal ENMT: Mucous membranes are moist. Posterior pharynx clear of any exudate or lesions.  Neck: normal and supple  No significant JVD appreciated Respiratory: Normal respiratory effort with expiratory wheezes appreciated especially noted when asked to take a deep breath.  No significant rhonchi appreciated.  Patient currently on 2 L nasal cannula oxygen with O2 saturations maintained. Cardiovascular: Regular rate and rhythm, no murmurs / rubs / gallops. No extremity edema. 2+ pedal pulses. No carotid bruits.  Abdomen: no tenderness, no masses palpated. No hepatosplenomegaly. Bowel sounds positive.  Musculoskeletal: no clubbing / cyanosis. No joint deformity upper and lower extremities. Good ROM, no contractures. Normal muscle tone.  Skin: no rashes, lesions, ulcers. No induration Neurologic: CN 2-12 grossly intact. Sensation intact, DTR normal. Strength 5/5 in all 4.  Psychiatric: Normal judgment and insight. Alert and oriented x 3. Normal mood.     Labs on Admission:  I have personally reviewed following labs and imaging studies  CBC: Recent Labs  Lab 06/10/21 0622  WBC 11.9*  NEUTROABS 9.3*  HGB 12.5  HCT 38.8  MCV 89.6  PLT 175   Basic Metabolic Panel: Recent Labs  Lab 06/10/21 0622  NA 134*  K 3.9  CL 101  CO2 25  GLUCOSE 110*  BUN 14  CREATININE 0.90  CALCIUM 9.2   GFR: Estimated Creatinine Clearance: 53.6 mL/min (by C-G formula based on SCr of 0.9 mg/dL). Liver Function Tests: No results for input(s): AST, ALT, ALKPHOS, BILITOT, PROT, ALBUMIN in the last 168 hours. No results for input(s): LIPASE, AMYLASE in the last 168 hours. No results for input(s): AMMONIA in the last 168 hours. Coagulation Profile: No results for input(s): INR, PROTIME in the last 168 hours. Cardiac Enzymes: No results for input(s): CKTOTAL, CKMB, CKMBINDEX, TROPONINI in the last 168 hours. BNP (last 3 results) No results for input(s): PROBNP in the last 8760 hours. HbA1C: No results for input(s): HGBA1C in the last 72 hours. CBG: No results for input(s): GLUCAP in the last 168 hours. Lipid Profile: No results for input(s): CHOL, HDL, LDLCALC, TRIG, CHOLHDL, LDLDIRECT in the last 72 hours. Thyroid Function Tests: No results for input(s): TSH, T4TOTAL, FREET4, T3FREE, THYROIDAB in the last 72 hours. Anemia Panel: No results for input(s): VITAMINB12, FOLATE, FERRITIN, TIBC, IRON, RETICCTPCT in the last 72 hours. Urine analysis:    Component Value Date/Time   COLORURINE YELLOW 10/11/2017 1108   APPEARANCEUR CLEAR 10/11/2017 1108   LABSPEC <=1.005 (A) 10/11/2017 1108   PHURINE 6.0 10/11/2017 1108   GLUCOSEU NEGATIVE 10/11/2017 1108   HGBUR NEGATIVE 10/11/2017 1108   [place] 10/11/2017 1108   KETONESUR NEGATIVE 10/11/2017 1108   UROBILINOGEN 0.2 10/11/2017 1108   NITRITE NEGATIVE 10/11/2017 1108   LEUKOCYTESUR NEGATIVE 10/11/2017 1108   Sepsis Labs: Recent Results (from the past 240 hour(s))  Resp Panel by RT-PCR (Flu A&B, Covid)  Nasopharyngeal Swab     Status: None   Collection Time: 06/10/21  6:11 AM   Specimen: Nasopharyngeal Swab; Nasopharyngeal(NP) swabs in vial transport medium  Result Value Ref Range Status   SARS Coronavirus 2 by RT PCR NEGATIVE NEGATIVE Final    Comment: (NOTE) SARS-CoV-2 target nucleic acids are NOT DETECTED.  The SARS-CoV-2 RNA is generally detectable in upper respiratory specimens during the acute phase of infection. The lowest concentration of SARS-CoV-2 viral copies this assay can detect is 138 copies/mL. A negative result does not preclude SARS-Cov-2 infection and should not be used as the sole basis for treatment or other patient management decisions. A negative result may occur with  improper specimen collection/handling, submission of specimen other than nasopharyngeal swab, presence of viral mutation(s) within the areas targeted by this assay, and inadequate number of viral copies(<138 copies/mL). A negative result must be combined with clinical observations, patient history, and epidemiological information. The expected result is Negative.  Fact Sheet for Patients:  EntrepreneurPulse.com.au  Fact Sheet for Healthcare Providers:  IncredibleEmployment.be  This test is no t yet approved or cleared by the [place] FDA and  has been authorized for detection and/or diagnosis of SARS-CoV-2 by FDA under an Emergency Use Authorization (EUA). This EUA will remain  in effect (meaning this test can be used) for the duration of the COVID-19 declaration under Section 564(b)(1) of the Act, 21 U.S.C.section 360bbb-3(b)(1), unless the authorization is terminated  or revoked sooner.       Influenza A by PCR NEGATIVE NEGATIVE Final   Influenza B by PCR NEGATIVE NEGATIVE Final    Comment: (NOTE) The Xpert Xpress SARS-CoV-2/FLU/RSV plus assay is intended as an aid in the diagnosis of influenza from Nasopharyngeal swab specimens and should not be  used as a sole basis for treatment. Nasal washings and aspirates are unacceptable for Xpert Xpress SARS-CoV-2/FLU/RSV testing.  Fact Sheet for Patients: EntrepreneurPulse.com.au  Fact Sheet for Healthcare Providers: IncredibleEmployment.be  This test is not yet approved or cleared by the [place] FDA and has been authorized for detection and/or diagnosis of SARS-CoV-2 by FDA under an Emergency Use Authorization (EUA). This EUA will remain in effect (meaning this test can be used) for the duration of the COVID-19 declaration under Section 564(b)(1) of the Act, 21 U.S.C. section 360bbb-3(b)(1), unless the authorization is terminated or revoked.  Performed at [facility], [address], [place], [place] [address]      Radiological Exams on Admission: DG Chest Portable 1 View  Result Date: 06/10/2021 CLINICAL DATA:  Shortness of breath. EXAM: PORTABLE CHEST 1 VIEW COMPARISON:  10/11/2017 FINDINGS: 0627 hours. The cardio pericardial silhouette is enlarged. Bibasilar atelectasis or infiltrate noted. No overt pulmonary edema or substantial pleural effusion. The visualized bony structures of the thorax show no acute abnormality. Telemetry leads overlie the chest. IMPRESSION: Bibasilar atelectasis or infiltrate. No edema or substantial pleural effusion. Electronically Signed   By: Misty Stanley M.D.   On: 06/10/2021 06:38    EKG: Independently reviewed.  Sinus rhythm at 79 bpm with note of an old anterior infarct.  Assessment/Plan Bronchitis secondary to tobacco use vs community-acquired pneumonia: Acute.  Patient presents with complaints of a productive cough, shortness of breath, and wheeze over the last couple weeks.  Endorses a 60 smoking pack year history.  On physical exam patient does have mild expiratory wheeze appreciated.  Chest x-ray noted bibasilar atelectasis versus infiltrate with no significant edema or pleural  effusion.  Patient has been started on empiric antibiotics of doxycycline and given breathing treatments.  Placed on 2 L nasal cannula oxygen due to complaints of shortness of breath, but was never noted to be hypoxic. -Admit to a telemetry bed -Continuous pulse oximetry with nasal cannula oxygen maintain O2 saturation greater than 92% -Check procalcitonin -Continue doxycycline -Prednisone 40 mg daily -  Albuterol nebs twice daily and as needed for shortness of breath/wheezing  Leukocytosis: Acute.  WBC elevated 11.9.  Patient did not have any signs of fever. -Recheck CBC tomorrow morning  Diastolic congestive heart failure: Patient's initial BNP 451.4, but no significant JVD or lower extremity swelling noted on physical exam at this time.  Last EF noted to be 50-55% with grade 2 diastolic dysfunction 09/98/3382.  She had received 40 mg of Lasix IV x1 dose yesterday. -Strict I&O's and daily weight -Continue beta-blocker and ACE-I -Give furosemide 40 mg IV x1 dose today, but consider resuming home chlorthalidone tomorrow   Elevated troponin  coronary artery disease  Patient currently denies any complaints of chest pain, but did report chest heaviness yesterday.  Noted to have high-sensitivity troponin 39-> 48-> 71.  Possibly secondary to demand. She had been given full dose aspirin.  Prior history of anterior ST elevation MI noted to have multivessel coronary artery disease with acute occlusion of the LAD for which she states 3 stents were placed in 01/2015. -Continue aspirin, Plavix, and statin  Essential hypertension: Home blood pressure regimen includes a Coreg 6.25 mg twice daily, chlorthalidone 12.5 mg daily, and lisinopril 40 mg daily. -Continue Coreg and lisinopril  -Consider resuming chlorthalidone tomorrow morning.  Hyponatremia: Resolved.  Sodium was initially 134 yesterday, but repeat check today 138 after being given Lasix 40 mg IV. -Continue to monitor  Hyperlipidemia: Home  medication regimen includes atorvastatin 80 mg daily. -Continue statin  GERD -Continue Protonix  Obesity: BMI 32.06 kg/m2  DVT prophylaxis: Lovenox Code Status: Full Family Communication: none Disposition Plan: Hopefully discharge home once medically stable Consults called: Cardiology Admission status: Observation  Norval Morton MD [facility]   If 7PM-7AM, please contact night-coverage   06/11/2021, 7:13 AM

## 2021-06-11 NOTE — Progress Notes (Signed)
Echocardiogram 2D Echocardiogram has been performed.  Oneal Deputy Lysandra Loughmiller RDCS 06/11/2021, 2:36 PM

## 2021-06-11 NOTE — Progress Notes (Signed)
Heart Failure Nurse Navigator Progress Note  Following this hospitalization to assess for HV TOC readiness.   Cardiology providers: Adora Fridge., MD + Thomasene Mohair., NP (last visit 05/20/2021)  04/2021: ECHO EF 50-55%, mild LVH, G2DD, mild-mod AI.  Pending ECHO results today.  Will plan for HV TOC pending ECHO results.   Pricilla Holm, MSN, RN Heart Failure Nurse Navigator [phone]

## 2021-06-11 NOTE — Progress Notes (Signed)
Heart Failure Nurse Navigator Progress Note  PCP: Libby Maw, MD PCP-Cardiologist: Adora Fridge MD Admission Diagnosis: Bronchitis vs CHF Admitted from: home with spouse  Presentation:   Terri [name] presented 1/5 with increased SOB and cough. Pt standing at sink brushing dentures. Spouse at bedside. Pt wearing 2 LPM via [place]---Navigator decreased to 1LPM SpO2 96%. Patient interactive with interview process. Pt states she and spouse both drive. Smokes 0.5PPD x 60 years, heavier at times--encouraged cessation. Pt states she occasionally drinks a glass of wine, socially. Pt denies illicit drugs. States medications are delivered via mail through the [place]. Requested use of TOC pharmacy for changes to medications upon DC.  Explained benefits of Heart & Vascular Transitions of Care Clinic appointment, patient agreeable.    ECHO/ LVEF: 11/22: 50-55%, mild LVH, G2DD, mild-mod AI.  Pending ECHO results today.   Clinical Course:  Past Medical History:  Diagnosis Date   Aortic insufficiency    a. Mild AI by echo 2014.   Arthritis    CAD (coronary artery disease)    a. Anterior STEMI 01/2015 s/p overlapping DES x2 to prox LAD, DES to prox RCA in [facility].   Chronic back pain    stenosis/HNP   History of bronchitis 2014   History of colon polyps    Hyperlipidemia    Ischemic cardiomyopathy    Joint pain    Joint swelling    Pneumonia 2014   Smokers' cough ([place])    Stress headaches    Tobacco abuse      Social History   Socioeconomic History   Marital status: Married    Spouse name: Annarae Macnair   Number of children: Not on file   Years of education: Not on file   Highest education level: Not on file  Occupational History   Occupation: retired  Tobacco Use   Smoking status: Every Day    Packs/day: 0.50    Years: 60.00    Pack years: 30.00    Types: Cigarettes   Smokeless tobacco: Never  Vaping Use   Vaping Use: Never used  Substance and Sexual  Activity   Alcohol use: No   Drug use: No   Sexual activity: Yes    Birth control/protection: Surgical  Other Topics Concern   Not on file  Social History Narrative   Not on file   Social Determinants of Health   Financial Resource Strain: Low Risk    Difficulty of Paying Living Expenses: Not hard at all  Food Insecurity: No Food Insecurity   Worried About Charity fundraiser in the Last Year: Never true   [place] in the Last Year: Never true  Transportation Needs: No Transportation Needs   Lack of Transportation (Medical): No   Lack of Transportation (Non-Medical): No  Physical Activity: Inactive   Days of Exercise per Week: 0 days   Minutes of Exercise per Session: 0 min  Stress: No Stress Concern Present   Feeling of Stress : Only a little  Social Connections: Moderately Integrated   Frequency of Communication with Friends and Family: More than three times a week   Frequency of Social Gatherings with Friends and Family: More than three times a week   Attends Religious Services: 1 to 4 times per year   Active Member of [employer] or Organizations: No   Attends Archivist Meetings: Never   Marital Status: Married    High Risk Criteria for Readmission and/or Poor Patient  Outcomes: Heart failure hospital admissions (last 6 months): 1  No Show rate: 0% Difficult social situation: no Demonstrates medication adherence: yes Primary Language: English Literacy level: able to read/write and comprehend  Barriers of Care: -tobacco abuse  Considerations/Referrals:   Referral made to Heart Failure Pharmacist Stewardship: no Referral made to Heart Failure CSW/NCM TOC: no Referral made to Heart & Vascular TOC clinic: yes, 1/11 @ 3pm  Items for Follow-up on DC/TOC: -optimize -smoking cessation  Pricilla Holm, MSN, RN Heart Failure Nurse Navigator [phone]

## 2021-06-11 NOTE — Progress Notes (Signed)
Mobility Specialist Progress Note    06/11/21 1533  Mobility  Activity Ambulated in hall  Level of Assistance Contact guard assist, steadying assist  Assistive Device  (hallway railings)  Distance Ambulated (ft) 490 ft  Mobility Ambulated with assistance in hallway  Mobility Response Tolerated fair  Mobility performed by Mobility specialist  $Mobility charge 1 Mobility   Pre-Mobility: 96% SpO2 During Mobility: 91% SpO2 Post-Mobility: 95% SpO2  Pt received sitting EOB and agreeable. Held conversation throughout walk but had SOB with exertion and talking. Ambulated on RA and SpO2 >/= 91%. Returned to sitting EOB with call bell in reach and husband present. RN advised that pt could remain on RA.   [facility] Mobility Specialist  M.S. 2C and 6E: [phone] M.S. 4E: [phone]

## 2021-06-11 NOTE — Consult Note (Signed)
Cardiology Consultation:   Patient ID: TYFFANI [name] MRN: [id number]; DOB: [birth year]  Admit date: 06/10/2021 Date of Consult: 06/11/2021  PCP:  Libby Maw, MD   [facility] Providers Cardiologist:  Quay Burow, MD        Patient Profile:   Terri [name] is a 76 y.o. female with a hx of CAD, ICM with improved EF, HTN, HLD, AI and tobacco abuse who is being seen 06/11/2021 for the evaluation of acute respiratory failure at the request of Dr. Tamala Julian.  History of Present Illness:   [name] is a pleasant 76 year old female with past medical history of CAD, ICM with improved EF, HTN, HLD, AI and tobacco abuse.  Patient had anterior STEMI while occasioning in [facility] in August 2016.  She underwent overlapping DES x2 to proximal LAD and DES to RCA at [facility] in [place].  Anginal symptom was described as a burning sensation almost like acid reflux but did not go away.  Ejection fraction improved to 40 to 45% by repeat echocardiogram on 03/03/2015.  Most recent echocardiogram was performed on 04/28/2021 which showed EF normalized to 50 to 55%, no regional wall motion abnormality, grade 2 DD, mild to moderate MR, mild to moderate AI.  He was most recently seen by Coletta Memos, NP on 05/20/2021 for acid reflux-like symptoms.  However her recent acid reflux symptom is slightly different from the previous MI in the sense that it typically occur at rest and improved with burping.  Lisinopril was increased to 40 mg daily due to elevated blood pressure.  For the past few weeks, patient began to notice worsening dyspnea on exertion and also at rest.  She has noticed orthopnea and PND as well.  This eventually prompted her to seek medical attention at [facility] ED on 06/10/2021.  On arrival, sodium was 134, creatinine 0.9.  BNP 451.4.  Serial troponin 39-->48-->71.  White blood cell count mildly elevated 11.9.  Manual panel was negative for anemia and COVID.  TSH normal.  Chest  x-ray showed a bibasilar atelectasis or infiltrate, no edema or substantial pleural effusion.  Patient was given breathing treatment.  She was also treated with doxycycline and 2 dose of IV Lasix 40 mg.  She put out 3 L of fluid since arrival.  She says her breathing has significantly improved.  She is currently on 2 L/min nasal cannula.    Past Medical History:  Diagnosis Date   Aortic insufficiency    a. Mild AI by echo 2014.   Arthritis    CAD (coronary artery disease)    a. Anterior STEMI 01/2015 s/p overlapping DES x2 to prox LAD, DES to prox RCA in [facility].   Chronic back pain    stenosis/HNP   History of bronchitis 2014   History of colon polyps    Hyperlipidemia    Ischemic cardiomyopathy    Joint pain    Joint swelling    Pneumonia 2014   Smokers' cough ([place])    Stress headaches    Tobacco abuse     Past Surgical History:  Procedure Laterality Date   ABDOMINAL HYSTERECTOMY     at age 81   BREAST BIOPSY Right    roughly 30 years ago with [facility]    cataract surgery Bilateral    CHOLECYSTECTOMY     colonscopy     ERCP     ESOPHAGOGASTRODUODENOSCOPY     LUMBAR LAMINECTOMY Right 07/25/2014   Procedure:  Right L4 Hemilaminectomy, Right L4-5 Laminotomy , Decompression, Microdisectomy;  Surgeon: Marybelle Killings, MD;  Location: [facility];  Service: Orthopedics;  Laterality: Right;   SHOULDER SURGERY Right    TONSILLECTOMY     as a child     Home Medications:  Prior to Admission medications   Medication Sig Start Date End Date Taking? Authorizing Provider  atorvastatin (LIPITOR) 80 MG tablet Take 1 tablet (80 mg total) by mouth daily. 07/31/20  Yes Lorretta Harp, MD  carvedilol (COREG) 6.25 MG tablet TAKE 1 TABLET BY MOUTH TWICE DAILY 07/31/20  Yes Lorretta Harp, MD  clopidogrel (PLAVIX) 75 MG tablet Take 1 tablet (75 mg total) by mouth daily. 07/31/20  Yes Lorretta Harp, MD  [facility] ASPIRIN 81 MG EC tablet Take 81 mg by mouth daily. 02/02/15  Yes [provider]  lisinopril (ZESTRIL) 40 MG tablet Take 1 tablet (40 mg total) by mouth daily. 05/20/21  Yes Deberah Pelton, NP  pantoprazole (PROTONIX) 20 MG tablet Take 1 tablet (20 mg total) by mouth daily. 04/14/21  Yes Cleaver, Jossie Ng, NP  albuterol (PROVENTIL HFA;VENTOLIN HFA) 108 (90 Base) MCG/ACT inhaler Inhale 1-2 puffs into the lungs every 6 (six) hours as needed for wheezing or shortness of breath. And or cough. 10/26/17   Libby Maw, MD  chlorthalidone (HYGROTON) 25 MG tablet Take 0.5 tablets (12.5 mg total) by mouth daily. 07/31/20 07/26/21  Lorretta Harp, MD  diazepam (VALIUM) 5 MG tablet TAKE 2 TABs PO 30 MINS BEFORE MRI 08/21/20   Marybelle Killings, MD  gabapentin (NEURONTIN) 100 MG capsule One at night times 5 days then 2 po q HS 09/16/20   Marybelle Killings, MD  predniSONE (STERAPRED UNI-PAK 21 TAB) 10 MG (21) TBPK tablet Take 6 tablets the first day with food then one less tablet each day, 5,4,3,2,1 then stop. 07/24/20   Marybelle Killings, MD  valACYclovir (VALTREX) 1000 MG tablet Take 1/2 tablet three times daily for 7 days. 04/01/21   Libby Maw, MD    Inpatient Medications: Scheduled Meds:  aspirin EC  81 mg Oral Daily   atorvastatin  80 mg Oral QPM   carvedilol  6.25 mg Oral BID WC   clopidogrel  75 mg Oral Daily   doxycycline  100 mg Oral Q12H   enoxaparin (LOVENOX) injection  40 mg Subcutaneous Q24H   lisinopril  40 mg Oral Daily   pantoprazole  20 mg Oral Daily   predniSONE  40 mg Oral Q breakfast   sodium chloride flush  3 mL Intravenous Q12H   Continuous Infusions:  sodium chloride     PRN Meds: sodium chloride, acetaminophen, albuterol, ondansetron (ZOFRAN) IV, sodium chloride flush  Allergies:    Allergies  Allergen Reactions   Erythromycin     nauseated    Social History:   Social History   Socioeconomic History   Marital status: Married    Spouse name: Not on file   Number of children: Not on file   Years of education: Not on file    Highest education level: Not on file  Occupational History   Not on file  Tobacco Use   Smoking status: Every Day    Packs/day: 0.50    Years: 50.00    Pack years: 25.00    Types: Cigarettes   Smokeless tobacco: Never  Substance and Sexual Activity   Alcohol use: No   Drug use: No   Sexual activity: Yes  Birth control/protection: Surgical  Other Topics Concern   Not on file  Social History Narrative   Not on file   Social Determinants of Health   Financial Resource Strain: Low Risk    Difficulty of Paying Living Expenses: Not hard at all  Food Insecurity: No Food Insecurity   Worried About Charity fundraiser in the Last Year: Never true   Arboriculturist in the Last Year: Never true  Transportation Needs: No Transportation Needs   Lack of Transportation (Medical): No   Lack of Transportation (Non-Medical): No  Physical Activity: Inactive   Days of Exercise per Week: 0 days   Minutes of Exercise per Session: 0 min  Stress: No Stress Concern Present   Feeling of Stress : Only a little  Social Connections: Moderately Integrated   Frequency of Communication with Friends and Family: More than three times a week   Frequency of Social Gatherings with Friends and Family: More than three times a week   Attends Religious Services: 1 to 4 times per year   Active Member of [employer] or Organizations: No   Attends Music therapist: Never   Marital Status: Married  Human resources officer Violence: Not At Risk   Fear of Current or Ex-Partner: No   Emotionally Abused: No   Physically Abused: No   Sexually Abused: No    Family History:    Family History  Problem Relation Age of Onset   Heart disease Mother    Heart failure Mother    Heart disease Father    Heart failure Father    CAD Other        both parents and multiple siblings with either died or had-related issues   Breast cancer Sister      ROS:  Please see the history of present illness.   All other ROS  reviewed and negative.     Physical Exam/Data:   Vitals:   06/11/21 0606 06/11/21 0648 06/11/21 0818 06/11/21 1118  BP:  100/68 (!) 158/76   Pulse:  72 73   Resp:  18    Temp:  98 F (36.7 C) 97.7 F (36.5 C)   TempSrc:  Oral Oral   SpO2:  98% 99%   Weight: 79.5 kg     Height:    5\' 2"  (1.575 m)    Intake/Output Summary (Last 24 hours) at 06/11/2021 1127 Last data filed at 06/11/2021 0905 Gross per 24 hour  Intake --  Output 400 ml  Net -400 ml   Last 3 Weights 06/11/2021 06/10/2021 05/20/2021  Weight (lbs) 175 lb 4.8 oz 180 lb 180 lb 9.6 oz  Weight (kg) 79.516 kg 81.647 kg 81.92 kg     Body mass index is 32.06 kg/m.  General:  Well nourished, well developed, in no acute distress HEENT: normal Neck: no JVD Vascular: No carotid bruits; Distal pulses 2+ bilaterally Cardiac:  normal S1, S2; RRR; no murmur  Lungs:  clear to auscultation bilaterally, no wheezing, rhonchi or rales  Abd: soft, nontender, no hepatomegaly  Ext: no edema Musculoskeletal:  No deformities, BUE and BLE strength normal and equal Skin: warm and dry  Neuro:  CNs 2-12 intact, no focal abnormalities noted Psych:  Normal affect   EKG:  The EKG was personally reviewed and demonstrates: Normal sinus rhythm, Q waves in the anteroseptal leads. Telemetry:  Telemetry was personally reviewed and demonstrates: Normal sinus rhythm, no significant ventricular ectopy.  Relevant CV Studies:  Echo 04/28/2021 1.  Left ventricular ejection fraction, by estimation, is 50 to 55%. Left  ventricular ejection fraction by 3D volume is 53 %. The left ventricle has  low normal function. The left ventricle has no regional wall motion  abnormalities. There is mild  concentric left ventricular hypertrophy. Left ventricular diastolic  parameters are consistent with Grade II diastolic dysfunction  (pseudonormalization). Elevated left ventricular end-diastolic pressure.   2. Right ventricular systolic function is normal. The right  ventricular  size is normal. There is normal pulmonary artery systolic pressure.   3. The mitral valve was not well visualized. Mild to moderate mitral  valve regurgitation.   4. The aortic valve was not well visualized. Aortic valve regurgitation  is mild to moderate.   Laboratory Data:  High Sensitivity Troponin:   Recent Labs  Lab 06/10/21 0622 06/10/21 0816 06/10/21 1222  TROPONINIHS 39* 48* 71*     Chemistry Recent Labs  Lab 06/10/21 0622 06/11/21 0834  NA 134* 138  K 3.9 4.1  CL 101 105  CO2 25 24  GLUCOSE 110* 107*  BUN 14 12  CREATININE 0.90 0.89  CALCIUM 9.2 9.1  MG  --  2.3  GFRNONAA >60 >60  ANIONGAP 8 9    Recent Labs  Lab 06/11/21 0834  PROT 6.5  ALBUMIN 3.6  AST 11*  ALT 15  ALKPHOS 112  BILITOT 0.5   Lipids No results for input(s): CHOL, TRIG, HDL, LABVLDL, LDLCALC, CHOLHDL in the last 168 hours.  Hematology Recent Labs  Lab 06/10/21 0622 06/11/21 0834  WBC 11.9* 7.9  RBC 4.33 4.34  HGB 12.5 12.6  HCT 38.8 39.0  MCV 89.6 89.9  MCH 28.9 29.0  MCHC 32.2 32.3  RDW 14.9 14.9  PLT 259 238   Thyroid No results for input(s): TSH, FREET4 in the last 168 hours.  BNP Recent Labs  Lab 06/10/21 0622  BNP 451.4*    DDimer No results for input(s): DDIMER in the last 168 hours.   Radiology/Studies:  DG Chest Portable 1 View  Result Date: 06/10/2021 CLINICAL DATA:  Shortness of breath. EXAM: PORTABLE CHEST 1 VIEW COMPARISON:  10/11/2017 FINDINGS: 0627 hours. The cardio pericardial silhouette is enlarged. Bibasilar atelectasis or infiltrate noted. No overt pulmonary edema or substantial pleural effusion. The visualized bony structures of the thorax show no acute abnormality. Telemetry leads overlie the chest. IMPRESSION: Bibasilar atelectasis or infiltrate. No edema or substantial pleural effusion. Electronically Signed   By: Misty Stanley M.D.   On: 06/10/2021 06:38     Assessment and Plan:   Acute respiratory failure   -Likely due to  combination of chronic tobacco abuse, mild volume overload and possible underlying pulmonary etiology as well.  On physical exam, she has very mild inspiratory and expiratory wheezing. -By the time cardiology service has seen the patient, she has put out about 3 L of fluid.  On physical exam, she appeared to be euvolemic.  She has been given doxycycline to help cover possible pulmonary etiology. -She is currently on 2 L nasal cannula and holding 97%.  Instructed nurse to start weaning her off of nasal cannula.  If she is able to hold more than 92% on room air, will attempt to obtain ambulatory O2 level.  Mild elevated troponin: Mildly elevated serial troponin likely related to acute respiratory failure.  Although patient has been complaining of acid reflux symptoms that is similar to the previous MRI from 2016, however current symptom seems to be occurring mostly at rest and improves  with burping.  -We will review EKG with MD.  EKG obtained during this admission seems to show more prominent Q wave in the septal leads when compared to the previous EKG.  Considering limited echocardiogram to assess wall motion and ejection fraction.  CAD: History of DES x2 to LAD and DES to RCA in August 2016.  Hypertension  Hyperlipidemia    Risk Assessment/Risk Scores:      New York Heart Association (NYHA) Functional Class NYHA Class III        For questions or updates, please contact [facility] Please consult [email or web address] for contact info under    Hilbert Corrigan, [place]  06/11/2021 11:27 AM

## 2021-06-12 DIAGNOSIS — J209 Acute bronchitis, unspecified: Secondary | ICD-10-CM | POA: Diagnosis not present

## 2021-06-12 DIAGNOSIS — J40 Bronchitis, not specified as acute or chronic: Secondary | ICD-10-CM | POA: Diagnosis not present

## 2021-06-12 DIAGNOSIS — J440 Chronic obstructive pulmonary disease with acute lower respiratory infection: Secondary | ICD-10-CM

## 2021-06-12 DIAGNOSIS — I5043 Acute on chronic combined systolic (congestive) and diastolic (congestive) heart failure: Secondary | ICD-10-CM

## 2021-06-12 DIAGNOSIS — I2510 Atherosclerotic heart disease of native coronary artery without angina pectoris: Secondary | ICD-10-CM | POA: Diagnosis not present

## 2021-06-12 DIAGNOSIS — Z720 Tobacco use: Secondary | ICD-10-CM | POA: Diagnosis not present

## 2021-06-12 LAB — BASIC METABOLIC PANEL
Anion gap: 9 (ref 5–15)
BUN: 16 mg/dL (ref 8–23)
CO2: 23 mmol/L (ref 22–32)
Calcium: 8.5 mg/dL — ABNORMAL LOW (ref 8.9–10.3)
Chloride: 104 mmol/L (ref 98–111)
Creatinine, Ser: 0.98 mg/dL (ref 0.44–1.00)
GFR, Estimated: >60 mL/min (ref >60)
Glucose, Bld: 118 mg/dL — ABNORMAL HIGH (ref 70–99)
Potassium: 4.3 mmol/L (ref 3.5–5.1)
Sodium: 136 mmol/L (ref 135–145)

## 2021-06-12 MED ORDER — FUROSEMIDE 40 MG PO TABS
40.0000 mg | ORAL_TABLET | Freq: Every day | ORAL | Status: DC
  Administered 2021-06-12: 40 mg via ORAL
  Filled 2021-06-12: qty 1

## 2021-06-12 MED ORDER — DOXYCYCLINE HYCLATE 100 MG PO TABS: 100.0000 mg | ORAL_TABLET | Freq: Two times a day (BID) | ORAL | 0 refills | Status: AC

## 2021-06-12 MED ORDER — FUROSEMIDE 40 MG PO TABS
40.0000 mg | ORAL_TABLET | Freq: Every day | ORAL | 0 refills | Status: DC
Start: 2021-06-12 — End: 2021-08-25

## 2021-06-12 MED ORDER — PREDNISONE 20 MG PO TABS: 20.0000 mg | ORAL_TABLET | Freq: Every day | ORAL | 0 refills | Status: DC

## 2021-06-12 NOTE — Plan of Care (Signed)
  Problem: Education: Goal: Knowledge of General Education information will improve Description: Including pain rating scale, medication(s)/side effects and non-pharmacologic comfort measures Outcome: Adequate for Discharge   

## 2021-06-12 NOTE — Progress Notes (Incomplete)
PROGRESS NOTE    Terri [name]  ZDG:[id number] DOB: 11/27/1945 DOA: 06/10/2021 PCP: Libby Maw, MD    Brief Narrative:  ***    Consultants:  ***  Procedures: ***  Antimicrobials:  ***    Subjective: ***  Objective: Vitals:   06/11/21 2036 06/11/21 2117 06/12/21 0617 06/12/21 0822  BP:  135/65 (!) 144/68 (!) 130/58  Pulse:  70 66 64  Resp:  17 17   Temp:  97.8 F (36.6 C) 97.8 F (36.6 C)   TempSrc:  Oral Oral   SpO2: 96% 94% 100% 96%  Weight:   81 kg   Height:        Intake/Output Summary (Last 24 hours) at 06/12/2021 0842 Last data filed at 06/12/2021 9518 Gross per 24 hour  Intake 246 ml  Output 900 ml  Net -654 ml   Filed Weights   06/10/21 0609 06/11/21 0606 06/12/21 0617  Weight: 81.6 kg 79.5 kg 81 kg    Examination:  General exam: Appears calm and comfortable  Respiratory system: Clear to auscultation. Respiratory effort normal. Cardiovascular system: S1 & S2 heard, RRR. No JVD, murmurs, rubs, gallops or clicks. No pedal edema. Gastrointestinal system: Abdomen is nondistended, soft and nontender. No organomegaly or masses felt. Normal bowel sounds heard. Central nervous system: Alert and oriented. No focal neurological deficits. Extremities: Symmetric 5 x 5 power. Skin: No rashes, lesions or ulcers Psychiatry: Judgement and insight appear normal. Mood & affect appropriate.     Data Reviewed: I have personally reviewed following labs and imaging studies  CBC: Recent Labs  Lab 06/10/21 0622 06/11/21 0834  WBC 11.9* 7.9  NEUTROABS 9.3* 5.5  HGB 12.5 12.6  HCT 38.8 39.0  MCV 89.6 89.9  PLT 259 841   Basic Metabolic Panel: Recent Labs  Lab 06/10/21 0622 06/11/21 0834 06/12/21 0232  NA 134* 138 136  K 3.9 4.1 4.3  CL 101 105 104  CO2 25 24 23   GLUCOSE 110* 107* 118*  BUN 14 12 16   CREATININE 0.90 0.89 0.98  CALCIUM 9.2 9.1 8.5*  MG  --  2.3  --    GFR: Estimated Creatinine Clearance: 49.7 mL/min (by C-G formula based  on SCr of 0.98 mg/dL). Liver Function Tests: Recent Labs  Lab 06/11/21 0834  AST 11*  ALT 15  ALKPHOS 112  BILITOT 0.5  PROT 6.5  ALBUMIN 3.6   No results for input(s): LIPASE, AMYLASE in the last 168 hours. No results for input(s): AMMONIA in the last 168 hours. Coagulation Profile: No results for input(s): INR, PROTIME in the last 168 hours. Cardiac Enzymes: No results for input(s): CKTOTAL, CKMB, CKMBINDEX, TROPONINI in the last 168 hours. BNP (last 3 results) No results for input(s): PROBNP in the last 8760 hours. HbA1C: No results for input(s): HGBA1C in the last 72 hours. CBG: No results for input(s): GLUCAP in the last 168 hours. Lipid Profile: No results for input(s): CHOL, HDL, LDLCALC, TRIG, CHOLHDL, LDLDIRECT in the last 72 hours. Thyroid Function Tests: Recent Labs    06/11/21 0834  TSH 1.402   Anemia Panel: No results for input(s): VITAMINB12, FOLATE, FERRITIN, TIBC, IRON, RETICCTPCT in the last 72 hours. Sepsis Labs: Recent Labs  Lab 06/11/21 0834  PROCALCITON <0.10    Recent Results (from the past 240 hour(s))  Resp Panel by RT-PCR (Flu A&B, Covid) Nasopharyngeal Swab     Status: None   Collection Time: 06/10/21  6:11 AM   Specimen: Nasopharyngeal Swab; Nasopharyngeal(NP) swabs in  vial transport medium  Result Value Ref Range Status   SARS Coronavirus 2 by RT PCR NEGATIVE NEGATIVE Final    Comment: (NOTE) SARS-CoV-2 target nucleic acids are NOT DETECTED.  The SARS-CoV-2 RNA is generally detectable in upper respiratory specimens during the acute phase of infection. The lowest concentration of SARS-CoV-2 viral copies this assay can detect is 138 copies/mL. A negative result does not preclude SARS-Cov-2 infection and should not be used as the sole basis for treatment or other patient management decisions. A negative result may occur with  improper specimen collection/handling, submission of specimen other than nasopharyngeal swab, presence of viral  mutation(s) within the areas targeted by this assay, and inadequate number of viral copies(<138 copies/mL). A negative result must be combined with clinical observations, patient history, and epidemiological information. The expected result is Negative.  Fact Sheet for Patients:  EntrepreneurPulse.com.au  Fact Sheet for Healthcare Providers:  IncredibleEmployment.be  This test is no t yet approved or cleared by the [place] FDA and  has been authorized for detection and/or diagnosis of SARS-CoV-2 by FDA under an Emergency Use Authorization (EUA). This EUA will remain  in effect (meaning this test can be used) for the duration of the COVID-19 declaration under Section 564(b)(1) of the Act, 21 U.S.C.section 360bbb-3(b)(1), unless the authorization is terminated  or revoked sooner.       Influenza A by PCR NEGATIVE NEGATIVE Final   Influenza B by PCR NEGATIVE NEGATIVE Final    Comment: (NOTE) The Xpert Xpress SARS-CoV-2/FLU/RSV plus assay is intended as an aid in the diagnosis of influenza from Nasopharyngeal swab specimens and should not be used as a sole basis for treatment. Nasal washings and aspirates are unacceptable for Xpert Xpress SARS-CoV-2/FLU/RSV testing.  Fact Sheet for Patients: EntrepreneurPulse.com.au  Fact Sheet for Healthcare Providers: IncredibleEmployment.be  This test is not yet approved or cleared by the [place] FDA and has been authorized for detection and/or diagnosis of SARS-CoV-2 by FDA under an Emergency Use Authorization (EUA). This EUA will remain in effect (meaning this test can be used) for the duration of the COVID-19 declaration under Section 564(b)(1) of the Act, 21 U.S.C. section 360bbb-3(b)(1), unless the authorization is terminated or revoked.  Performed at [facility], [address] [place], [place] [address]          Radiology  Studies: ECHOCARDIOGRAM LIMITED  Result Date: 06/11/2021    ECHOCARDIOGRAM LIMITED REPORT   Patient Name:   Terri [name] Date of Exam: 06/11/2021 Medical Rec #:  [id number]     Height:       62.0 in Accession #:    [id number]    Weight:       175.3 lb Date of Birth:  Oct 16, 1945      BSA:          1.808 m Patient Age:    76 years      BP:           131/58 mmHg Patient Gender: F             HR:           69 bpm. Exam Location:  Inpatient Procedure: Limited Echo Indications:    I50.9* Heart failure (unspecified)  History:        Patient has prior history of Echocardiogram examinations, most                 recent 04/28/2021. CAD; Risk Factors:Hypertension and  Dyslipidemia.  Sonographer:    Raquel Sarna Senior RDCS Referring Phys: [id number] [place]  Sonographer Comments: Limited to assess for WMA IMPRESSIONS  1. Poor quality images even with definity septuma and mid/basal inferior wall hypokinetic. Left ventricular ejection fraction, by estimation, is 40 to 45%. The left ventricle has mildly decreased function. The left ventricle demonstrates regional wall motion abnormalities (see scoring diagram/findings for description). The left ventricular internal cavity size was mildly dilated. FINDINGS  Left Ventricle: Poor quality images even with definity septuma and mid/basal inferior wall hypokinetic. Left ventricular ejection fraction, by estimation, is 40 to 45%. The left ventricle has mildly decreased function. The left ventricle demonstrates regional wall motion abnormalities. The left ventricular internal cavity size was mildly dilated. Jenkins Rouge MD Electronically signed by Jenkins Rouge MD Signature Date/Time: 06/11/2021/4:21:28 PM    Final         Scheduled Meds:  albuterol  2.5 mg Nebulization BID   aspirin EC  81 mg Oral Daily   atorvastatin  80 mg Oral QPM   carvedilol  6.25 mg Oral BID WC   clopidogrel  75 mg Oral Daily   doxycycline  100 mg Oral Q12H   enoxaparin (LOVENOX)  injection  40 mg Subcutaneous Q24H   furosemide  40 mg Oral Daily   lisinopril  40 mg Oral Daily   pantoprazole  20 mg Oral Daily   predniSONE  40 mg Oral Q breakfast   sodium chloride flush  3 mL Intravenous Q12H   Continuous Infusions:  sodium chloride      Assessment & Plan:   Principal Problem:   Bronchitis due to tobacco use Active Problems:   Hyperlipidemia   Coronary artery disease involving native heart   Acute bronchitis with COPD (HCC)   Elevated troponin   Hyponatremia   ***   DVT prophylaxis:  Code Status: Family Communication:  Disposition Plan:  Status is: Observation  {Observation:23811}           LOS: 0 days   Time spent: ***    Nolberto Hanlon, MD [facility] Pager [phone]  If 7PM-7AM, please contact night-coverage 06/12/2021, 8:42 AM

## 2021-06-12 NOTE — TOC Transition Note (Signed)
Transition of Care [facility]) - CM/SW Discharge Note   Patient Details  Name: Terri [name] MRN: [id number] Date of Birth: 1946-01-08  Transition of Care [facility]) CM/SW Contact:  Konrad Penta, RN Phone Number: [phone] 06/12/2021, 10:23 AM   Clinical Narrative:  Spoke with [name] prior to transition home. Lives with spouse. States she is doing well and is ready to go home. Denies having any TOC needs.      Final next level of care: Home/Self Care Barriers to Discharge: No Barriers Identified   Patient Goals and CMS Choice Patient states their goals for this hospitalization and ongoing recovery are:: return home      Discharge Placement                       Discharge Plan and Services                                     Social Determinants of Health (SDOH) Interventions Food Insecurity Interventions: Intervention Not Indicated Financial Strain Interventions: Intervention Not Indicated Housing Interventions: Intervention Not Indicated Transportation Interventions: Intervention Not Indicated   Readmission Risk Interventions No flowsheet data found.

## 2021-06-12 NOTE — Discharge Summary (Signed)
Terri [name] EQA:[id number] DOB: June 13, 1945 DOA: 06/10/2021  PCP: Libby Maw, MD  Admit date: 06/10/2021 Discharge date: 06/12/2021  Admitted From: Home Disposition: Home  Recommendations for Outpatient Follow-up:  Follow up with PCP in 1 week Please obtain BMP/CBC in one week Please follow up with cardiology next week     Discharge Condition:Stable CODE STATUS: Full Diet recommendation: Heart Healthy  Brief/Interim Summary: Per HPI::diastolic CHF last EF 97-98% with grade 2 diastolic dysfunction, HSV, tobacco abuse, and obesity presents with complaints of progressively worsening cough and shortness of breath over the last 3-4 week.  She complains of having a productive cough with mostly clear sputum production.  Patient notices the shortness of breath occurs mostly at night when trying to lay down at night and reports waking up due to feeling as though she cannot breathe.  Associated symptoms include wheezing, insomnia, unwarranted weight loss of approximately 7 pounds since her office visit with cardiology last month, and left foot pain with ankle swelling.  However, patient notes that the left foot pain resolved after changing to shoes with arch support.  Noted by the ED provider to endorse some chest heaviness.  Denies having any significant fever, chest pain, nausea, vomiting, abdominal pain, dysuria, or focal weakness.  She has continued to smoke 7-8 cigarettes/day on average, but reports having at least a having an approximate 60 smoking pack year history.  Patient has been using Primatene Mist with some temporary improvement in symptoms.  Normally she is not on oxygen, but states that it is helped her breathe.  Last reports being on steroids over a year ago.   ED Course: Upon admission to the emergency department patient had been noted to be afebrile, pulse 58-85, respirations 13-28, blood pressure 112/57-160 6/74, and O2 saturations currently maintained on 2 L nasal cannula  oxygen.  Patient was never noted to be hypoxic, but was placed on oxygen for comfort.  Labs since 1/5 no WBC 11.9, sodium 134, potassium 3.9, BNP 451.4, and high-sensitivity troponin 39-> 48-> 71.  Chest x-ray noted bibasilar infiltrate or atelectasis without signs of edema or pleural effusion.  Influenza and COVID-19 screening were negative.  Patient has been given full dose aspirin 324 mg, doxycycline., breathing treatments.  Patient was admitted.  Cardiology was consulted.  Was started on diuretics, prednisone and doxycycline.  She is stable to be discharged home today.  Acute bronchitis secondary to tobacco use  No evidence of pneumonia, afebrile  Continue prednisone taper  Continue doxycycline  Will need to follow-up with PCP for further management  Patient was counseled to stop smoking Leukocytosis improved    Acute combined systolic and diastolic congestive heart failure:  BNP was elevated  Cardiology was consulted  Chlorthalidone was discontinued  Started on IV Lasix and then p.o.  Euvolemic today and clinically has improved Per cardiology-repeat echo with contrast done yesterday.  This confirmed reduced EF and wall motion abnormality not well seen on outpatient echo, but side by side they appear similar consider changing lisinopril to entresto if BP allows at follow up. Could also consider spironolactone and/or SGLT2i based on K/volume status at follow up Counseled patient about fluid restriction and salt restriction, daily weight Will need to follow-up with [facility] and cardiology next week     Elevated troponin/coronary artery disease   Troponin mildly elevated  History of prior DES to LAD and RCA  Elevated troponin likely due to demand ischemia  Stop smoking  Continue continue home  meds    Essential hypertension:  Overall stable.  Chlorthalidone was switched to Lasix daily as above      Hyponatremia: Mild.  Likely due to volume overload   Improved with Lasix      Hyperlipidemia: Continue statin     GERD -Continue Protonix   Obesity: BMI 32.06 kg/m2-encouraged weight loss     Tobacco abuse-counseled patient on smoking cessation    Discharge Diagnoses:  Principal Problem:   Bronchitis due to tobacco use Active Problems:   Hyperlipidemia   Coronary artery disease involving native heart   Acute bronchitis with COPD (HCC)   Elevated troponin   Hyponatremia    Discharge Instructions  Discharge Instructions     Call MD for:  difficulty breathing, headache or visual disturbances   Complete by: As directed    Diet - low sodium heart healthy   Complete by: As directed    Discharge instructions   Complete by: As directed    salt/fluid restrictions, daily weights Stop smoking please F/u with appointments   Increase activity slowly   Complete by: As directed       Allergies as of 06/12/2021       Reactions   Erythromycin    nauseated        Medication List     STOP taking these medications    chlorthalidone 25 MG tablet Commonly known as: HYGROTON       TAKE these medications    albuterol 108 (90 Base) MCG/ACT inhaler Commonly known as: VENTOLIN HFA Inhale 1-2 puffs into the lungs every 6 (six) hours as needed for wheezing or shortness of breath. And or cough.   atorvastatin 80 MG tablet Commonly known as: LIPITOR Take 1 tablet (80 mg total) by mouth daily.   carvedilol 6.25 MG tablet Commonly known as: COREG TAKE 1 TABLET BY MOUTH TWICE DAILY   clopidogrel 75 MG tablet Commonly known as: PLAVIX Take 1 tablet (75 mg total) by mouth daily.   [facility] Aspirin 81 MG EC tablet Generic drug: aspirin Take 81 mg by mouth daily.   doxycycline 100 MG tablet Commonly known as: VIBRA-TABS Take 1 tablet (100 mg total) by mouth every 12 (twelve) hours for 3 days.   furosemide 40 MG tablet Commonly known as: LASIX Take 1 tablet (40 mg total) by mouth daily.   lisinopril 40 MG  tablet Commonly known as: ZESTRIL Take 1 tablet (40 mg total) by mouth daily.   pantoprazole 20 MG tablet Commonly known as: Protonix Take 1 tablet (20 mg total) by mouth daily.   predniSONE 20 MG tablet Commonly known as: DELTASONE Take 1 tablet (20 mg total) by mouth daily with breakfast for 3 doses. Start taking on: June 13, 2021        Follow-up Information     [place]. Go to.   Specialty: Cardiology Why: Wednesday, January 11 @ 3pm for [facility] clinic within [place]. Garage code: 1202 at [employer] (off [address]) Bring all medications with you.  Decrease salt/sodium and 2073mL fluid restrictions. work to quit smoking. Contact information: [address] [phone] [place]        Lorretta Harp, MD Follow up in 1 week(s).   Specialties: Cardiology, Radiology Contact information: [address] [place] [address] [phone]         Libby Maw, MD Follow up in 1 week(s).  Specialty: Family Medicine Contact information: [place] [address] [phone]                Allergies  Allergen Reactions   Erythromycin     nauseated    Consultations: Cardiology   Procedures/Studies: DG Chest Portable 1 View  Result Date: 06/10/2021 CLINICAL DATA:  Shortness of breath. EXAM: PORTABLE CHEST 1 VIEW COMPARISON:  10/11/2017 FINDINGS: 0627 hours. The cardio pericardial silhouette is enlarged. Bibasilar atelectasis or infiltrate noted. No overt pulmonary edema or substantial pleural effusion. The visualized bony structures of the thorax show no acute abnormality. Telemetry leads overlie the chest. IMPRESSION: Bibasilar atelectasis or infiltrate. No edema or substantial pleural effusion. Electronically Signed   By: Misty Stanley M.D.   On: 06/10/2021 06:38   ECHOCARDIOGRAM  LIMITED  Result Date: 06/11/2021    ECHOCARDIOGRAM LIMITED REPORT   Patient Name:   Terri [name] Date of Exam: 06/11/2021 Medical Rec #:  [id number]     Height:       62.0 in Accession #:    [id number]    Weight:       175.3 lb Date of Birth:  09-Jan-1946      BSA:          1.808 m Patient Age:    76 years      BP:           131/58 mmHg Patient Gender: F             HR:           69 bpm. Exam Location:  Inpatient Procedure: Limited Echo Indications:    I50.9* Heart failure (unspecified)  History:        Patient has prior history of Echocardiogram examinations, most                 recent 04/28/2021. CAD; Risk Factors:Hypertension and                 Dyslipidemia.  Sonographer:    Raquel Sarna Senior RDCS Referring Phys: [id number] [place]  Sonographer Comments: Limited to assess for WMA IMPRESSIONS  1. Poor quality images even with definity septuma and mid/basal inferior wall hypokinetic. Left ventricular ejection fraction, by estimation, is 40 to 45%. The left ventricle has mildly decreased function. The left ventricle demonstrates regional wall motion abnormalities (see scoring diagram/findings for description). The left ventricular internal cavity size was mildly dilated. FINDINGS  Left Ventricle: Poor quality images even with definity septuma and mid/basal inferior wall hypokinetic. Left ventricular ejection fraction, by estimation, is 40 to 45%. The left ventricle has mildly decreased function. The left ventricle demonstrates regional wall motion abnormalities. The left ventricular internal cavity size was mildly dilated. Jenkins Rouge MD Electronically signed by Jenkins Rouge MD Signature Date/Time: 06/11/2021/4:21:28 PM    Final       Subjective: Feels better.  No shortness of breath, chest pain, or dyspnea on exertion.  No wheezing  Discharge Exam: Vitals:   06/12/21 0617 06/12/21 0822  BP: (!) 144/68 (!) 130/58  Pulse: 66 64  Resp: 17   Temp: 97.8 F (36.6 C)   SpO2: 100% 96%   Vitals:    06/11/21 2036 06/11/21 2117 06/12/21 0617 06/12/21 0822  BP:  135/65 (!) 144/68 (!) 130/58  Pulse:  70 66 64  Resp:  17 17   Temp:  97.8 F (36.6 C) 97.8 F (36.6 C)   TempSrc:  Oral Oral   SpO2: 96% 94%  100% 96%  Weight:   81 kg   Height:        General: Pt is alert, awake, not in acute distress Cardiovascular: RRR, S1/S2 +, no rubs, no gallops Respiratory: CTA bilaterally, no wheezing, no rhonchi Abdominal: Soft, NT, ND, bowel sounds + Extremities: no edema, no cyanosis    The results of significant diagnostics from this hospitalization (including imaging, microbiology, ancillary and laboratory) are listed below for reference.     Microbiology: Recent Results (from the past 240 hour(s))  Resp Panel by RT-PCR (Flu A&B, Covid) Nasopharyngeal Swab     Status: None   Collection Time: 06/10/21  6:11 AM   Specimen: Nasopharyngeal Swab; Nasopharyngeal(NP) swabs in vial transport medium  Result Value Ref Range Status   SARS Coronavirus 2 by RT PCR NEGATIVE NEGATIVE Final    Comment: (NOTE) SARS-CoV-2 target nucleic acids are NOT DETECTED.  The SARS-CoV-2 RNA is generally detectable in upper respiratory specimens during the acute phase of infection. The lowest concentration of SARS-CoV-2 viral copies this assay can detect is 138 copies/mL. A negative result does not preclude SARS-Cov-2 infection and should not be used as the sole basis for treatment or other patient management decisions. A negative result may occur with  improper specimen collection/handling, submission of specimen other than nasopharyngeal swab, presence of viral mutation(s) within the areas targeted by this assay, and inadequate number of viral copies(<138 copies/mL). A negative result must be combined with clinical observations, patient history, and epidemiological information. The expected result is Negative.  Fact Sheet for Patients:  EntrepreneurPulse.com.au  Fact Sheet for Healthcare  Providers:  IncredibleEmployment.be  This test is no t yet approved or cleared by the [place] FDA and  has been authorized for detection and/or diagnosis of SARS-CoV-2 by FDA under an Emergency Use Authorization (EUA). This EUA will remain  in effect (meaning this test can be used) for the duration of the COVID-19 declaration under Section 564(b)(1) of the Act, 21 U.S.C.section 360bbb-3(b)(1), unless the authorization is terminated  or revoked sooner.       Influenza A by PCR NEGATIVE NEGATIVE Final   Influenza B by PCR NEGATIVE NEGATIVE Final    Comment: (NOTE) The Xpert Xpress SARS-CoV-2/FLU/RSV plus assay is intended as an aid in the diagnosis of influenza from Nasopharyngeal swab specimens and should not be used as a sole basis for treatment. Nasal washings and aspirates are unacceptable for Xpert Xpress SARS-CoV-2/FLU/RSV testing.  Fact Sheet for Patients: EntrepreneurPulse.com.au  Fact Sheet for Healthcare Providers: IncredibleEmployment.be  This test is not yet approved or cleared by the [place] FDA and has been authorized for detection and/or diagnosis of SARS-CoV-2 by FDA under an Emergency Use Authorization (EUA). This EUA will remain in effect (meaning this test can be used) for the duration of the COVID-19 declaration under Section 564(b)(1) of the Act, 21 U.S.C. section 360bbb-3(b)(1), unless the authorization is terminated or revoked.  Performed at [facility], [address] [place], [place] [address]      Labs: BNP (last 3 results) Recent Labs    06/10/21 0622  BNP 683.4*   Basic Metabolic Panel: Recent Labs  Lab 06/10/21 0622 06/11/21 0834 06/12/21 0232  NA 134* 138 136  K 3.9 4.1 4.3  CL 101 105 104  CO2 25 24 23   GLUCOSE 110* 107* 118*  BUN 14 12 16   CREATININE 0.90 0.89 0.98  CALCIUM 9.2 9.1 8.5*  MG  --  2.3  --    Liver Function  Tests: Recent  Labs  Lab 06/11/21 0834  AST 11*  ALT 15  ALKPHOS 112  BILITOT 0.5  PROT 6.5  ALBUMIN 3.6   No results for input(s): LIPASE, AMYLASE in the last 168 hours. No results for input(s): AMMONIA in the last 168 hours. CBC: Recent Labs  Lab 06/10/21 0622 06/11/21 0834  WBC 11.9* 7.9  NEUTROABS 9.3* 5.5  HGB 12.5 12.6  HCT 38.8 39.0  MCV 89.6 89.9  PLT 259 238   Cardiac Enzymes: No results for input(s): CKTOTAL, CKMB, CKMBINDEX, TROPONINI in the last 168 hours. BNP: Invalid input(s): POCBNP CBG: No results for input(s): GLUCAP in the last 168 hours. D-Dimer No results for input(s): DDIMER in the last 72 hours. Hgb A1c No results for input(s): HGBA1C in the last 72 hours. Lipid Profile No results for input(s): CHOL, HDL, LDLCALC, TRIG, CHOLHDL, LDLDIRECT in the last 72 hours. Thyroid function studies Recent Labs    06/11/21 0834  TSH 1.402   Anemia work up No results for input(s): VITAMINB12, FOLATE, FERRITIN, TIBC, IRON, RETICCTPCT in the last 72 hours. Urinalysis    Component Value Date/Time   COLORURINE YELLOW 10/11/2017 1108   APPEARANCEUR CLEAR 10/11/2017 1108   LABSPEC <=1.005 (A) 10/11/2017 1108   PHURINE 6.0 10/11/2017 1108   GLUCOSEU NEGATIVE 10/11/2017 1108   HGBUR NEGATIVE 10/11/2017 1108   [place] 10/11/2017 1108   KETONESUR NEGATIVE 10/11/2017 1108   UROBILINOGEN 0.2 10/11/2017 1108   NITRITE NEGATIVE 10/11/2017 1108   LEUKOCYTESUR NEGATIVE 10/11/2017 1108   Sepsis Labs Invalid input(s): PROCALCITONIN,  WBC,  LACTICIDVEN Microbiology Recent Results (from the past 240 hour(s))  Resp Panel by RT-PCR (Flu A&B, Covid) Nasopharyngeal Swab     Status: None   Collection Time: 06/10/21  6:11 AM   Specimen: Nasopharyngeal Swab; Nasopharyngeal(NP) swabs in vial transport medium  Result Value Ref Range Status   SARS Coronavirus 2 by RT PCR NEGATIVE NEGATIVE Final    Comment: (NOTE) SARS-CoV-2 target nucleic acids are NOT DETECTED.  The  SARS-CoV-2 RNA is generally detectable in upper respiratory specimens during the acute phase of infection. The lowest concentration of SARS-CoV-2 viral copies this assay can detect is 138 copies/mL. A negative result does not preclude SARS-Cov-2 infection and should not be used as the sole basis for treatment or other patient management decisions. A negative result may occur with  improper specimen collection/handling, submission of specimen other than nasopharyngeal swab, presence of viral mutation(s) within the areas targeted by this assay, and inadequate number of viral copies(<138 copies/mL). A negative result must be combined with clinical observations, patient history, and epidemiological information. The expected result is Negative.  Fact Sheet for Patients:  EntrepreneurPulse.com.au  Fact Sheet for Healthcare Providers:  IncredibleEmployment.be  This test is no t yet approved or cleared by the [place] FDA and  has been authorized for detection and/or diagnosis of SARS-CoV-2 by FDA under an Emergency Use Authorization (EUA). This EUA will remain  in effect (meaning this test can be used) for the duration of the COVID-19 declaration under Section 564(b)(1) of the Act, 21 U.S.C.section 360bbb-3(b)(1), unless the authorization is terminated  or revoked sooner.       Influenza A by PCR NEGATIVE NEGATIVE Final   Influenza B by PCR NEGATIVE NEGATIVE Final    Comment: (NOTE) The Xpert Xpress SARS-CoV-2/FLU/RSV plus assay is intended as an aid in the diagnosis of influenza from Nasopharyngeal swab specimens and should not be used as a sole basis for treatment. Nasal  washings and aspirates are unacceptable for Xpert Xpress SARS-CoV-2/FLU/RSV testing.  Fact Sheet for Patients: EntrepreneurPulse.com.au  Fact Sheet for Healthcare Providers: IncredibleEmployment.be  This test is not yet approved or  cleared by the [place] FDA and has been authorized for detection and/or diagnosis of SARS-CoV-2 by FDA under an Emergency Use Authorization (EUA). This EUA will remain in effect (meaning this test can be used) for the duration of the COVID-19 declaration under Section 564(b)(1) of the Act, 21 U.S.C. section 360bbb-3(b)(1), unless the authorization is terminated or revoked.  Performed at [facility], [address], [place], [place] [address]      Time coordinating discharge: Over 30 minutes  SIGNED:   Nolberto Hanlon, MD  [facility] 06/12/2021, 9:51 AM Pager   If 7PM-7AM, please contact night-coverage [email or web address] Password [email or web address]

## 2021-06-12 NOTE — Progress Notes (Signed)
Spo2 dropping to 85%-89% while sleeping, 2L O2 via [place] applied. Day shift RN updated

## 2021-06-12 NOTE — Progress Notes (Signed)
Progress Note  Patient Name: Terri [name] Date of Encounter: 06/12/2021  [facility] Cardiologist: Quay Burow, MD   Subjective   No acute events overnight. Off O2, feels that breathing is much improved. Reviewed echo results, discussed medication changes.  Inpatient Medications    Scheduled Meds:  albuterol  2.5 mg Nebulization BID   aspirin EC  81 mg Oral Daily   atorvastatin  80 mg Oral QPM   carvedilol  6.25 mg Oral BID WC   clopidogrel  75 mg Oral Daily   doxycycline  100 mg Oral Q12H   enoxaparin (LOVENOX) injection  40 mg Subcutaneous Q24H   furosemide  40 mg Oral Daily   lisinopril  40 mg Oral Daily   pantoprazole  20 mg Oral Daily   predniSONE  40 mg Oral Q breakfast   sodium chloride flush  3 mL Intravenous Q12H   Continuous Infusions:  sodium chloride     PRN Meds: sodium chloride, acetaminophen, albuterol, ondansetron (ZOFRAN) IV, sodium chloride flush   Vital Signs    Vitals:   06/11/21 2036 06/11/21 2117 06/12/21 0617 06/12/21 0822  BP:  135/65 (!) 144/68 (!) 130/58  Pulse:  70 66 64  Resp:  17 17   Temp:  97.8 F (36.6 C) 97.8 F (36.6 C)   TempSrc:  Oral Oral   SpO2: 96% 94% 100% 96%  Weight:   81 kg   Height:        Intake/Output Summary (Last 24 hours) at 06/12/2021 0926 Last data filed at 06/12/2021 0823 Gross per 24 hour  Intake 246 ml  Output 500 ml  Net -254 ml   Last 3 Weights 06/12/2021 06/11/2021 06/10/2021  Weight (lbs) 178 lb 9.2 oz 175 lb 4.8 oz 180 lb  Weight (kg) 81 kg 79.516 kg 81.647 kg      Telemetry    NSR - Personally Reviewed  ECG    No new since 06/10/21 - Personally Reviewed  Physical Exam   GEN: No acute distress.   Neck: No JVD Cardiac: RRR, no murmurs, rubs, or gallops.  Respiratory: Largely clear, some end expiratory wheezing GI: Soft, nontender, non-distended  MS: No edema; No deformity. Neuro:  Nonfocal  Psych: Normal affect   Labs    High Sensitivity Troponin:   Recent Labs  Lab  06/10/21 0622 06/10/21 0816 06/10/21 1222  TROPONINIHS 39* 48* 71*     Chemistry Recent Labs  Lab 06/10/21 0622 06/11/21 0834 06/12/21 0232  NA 134* 138 136  K 3.9 4.1 4.3  CL 101 105 104  CO2 25 24 23   GLUCOSE 110* 107* 118*  BUN 14 12 16   CREATININE 0.90 0.89 0.98  CALCIUM 9.2 9.1 8.5*  MG  --  2.3  --   PROT  --  6.5  --   ALBUMIN  --  3.6  --   AST  --  11*  --   ALT  --  15  --   ALKPHOS  --  112  --   BILITOT  --  0.5  --   GFRNONAA >60 >60 >60  ANIONGAP 8 9 9     Lipids No results for input(s): CHOL, TRIG, HDL, LABVLDL, LDLCALC, CHOLHDL in the last 168 hours.  Hematology Recent Labs  Lab 06/10/21 0622 06/11/21 0834  WBC 11.9* 7.9  RBC 4.33 4.34  HGB 12.5 12.6  HCT 38.8 39.0  MCV 89.6 89.9  MCH 28.9 29.0  MCHC 32.2 32.3  RDW 14.9  14.9  PLT 259 238   Thyroid  Recent Labs  Lab 06/11/21 0834  TSH 1.402    BNP Recent Labs  Lab 06/10/21 0622  BNP 451.4*    DDimer No results for input(s): DDIMER in the last 168 hours.   Radiology    ECHOCARDIOGRAM LIMITED  Result Date: 06/11/2021    ECHOCARDIOGRAM LIMITED REPORT   Patient Name:   Terri [name] Date of Exam: 06/11/2021 Medical Rec #:  [id number]     Height:       62.0 in Accession #:    [id number]    Weight:       175.3 lb Date of Birth:  Mar 20, 1946      BSA:          1.808 m Patient Age:    9 years      BP:           131/58 mmHg Patient Gender: F             HR:           69 bpm. Exam Location:  Inpatient Procedure: Limited Echo Indications:    I50.9* Heart failure (unspecified)  History:        Patient has prior history of Echocardiogram examinations, most                 recent 04/28/2021. CAD; Risk Factors:Hypertension and                 Dyslipidemia.  Sonographer:    Raquel Sarna Senior RDCS Referring Phys: [id number] [place]  Sonographer Comments: Limited to assess for WMA IMPRESSIONS  1. Poor quality images even with definity septuma and mid/basal inferior wall hypokinetic. Left ventricular  ejection fraction, by estimation, is 40 to 45%. The left ventricle has mildly decreased function. The left ventricle demonstrates regional wall motion abnormalities (see scoring diagram/findings for description). The left ventricular internal cavity size was mildly dilated. FINDINGS  Left Ventricle: Poor quality images even with definity septuma and mid/basal inferior wall hypokinetic. Left ventricular ejection fraction, by estimation, is 40 to 45%. The left ventricle has mildly decreased function. The left ventricle demonstrates regional wall motion abnormalities. The left ventricular internal cavity size was mildly dilated. Jenkins Rouge MD Electronically signed by Jenkins Rouge MD Signature Date/Time: 06/11/2021/4:21:28 PM    Final     Cardiac Studies   Echo 06/11/21  1. Poor quality images even with definity septuma and mid/basal inferior  wall hypokinetic. Left ventricular ejection fraction, by estimation, is 40  to 45%. The left ventricle has mildly decreased function. The left  ventricle demonstrates regional wall motion abnormalities (see scoring diagram/findings for description). The left ventricular internal cavity size was mildly dilated.   Patient Profile     76 y.o. female with PMH CAD, ischemic cardiomyopathy with previously improved EF, hypertension, hyperlipidemia, tobacco abuse who is being seen for acute respiratory failure at the request of Dr. Tamala Julian.  Assessment & Plan    Shortness of breath Chronic systolic and diastolic heart failure History of ischemic cardiomyopathy, previously with recovered EF -improved with diuresis and doxycycline -cough, wheezing suggest component of bronchitis -she appears euvolemic today. I/O not well documented -see repeat echo with contrast from yesterday. This confirms reduced EF and wall motion abnormality not well seen on outpatient echo, but side by side they appear similar. We discussed this today -with confirmation of reduced EF, will start  furosemide 40 mg daily. Will replace outpatient chlorthalidone. -consider changing lisinopril to  entresto if BP allows at follow up. Could also consider spironolactone and/or SGLT2i based on K/volume status at follow up -continue carvedilol -counseled on salt/fluid restrictions, daily weights. She has been losing weight unintentionally, so it may be difficult to use weight based diuretic dosing   Elevated troponin Known CAD History of prior DES to LAD and RCA -minimally elevated troponin, suspect demand ischemia -counseled on tobacco cessation -continue aspirin, statin  [facility] will sign off.   Medication Recommendations:   START furosemide 40 mg daily STOP chlorthalidone (prior home med) CONTINUE aspirin, atorvastatin, carvedilol, lisinopril Other recommendations (labs, testing, etc):  bmet on follow up Follow up as an outpatient:  She has an appt scheduled with HF TOC on 06/16/21 and with Coletta Memos on 07/16/21  For questions or updates, please contact [place] Please consult [email or web address] for contact info under        Signed, Buford Dresser, MD  06/12/2021, 9:26 AM

## 2021-06-15 NOTE — Progress Notes (Signed)
Cardiology Clinic Note   Patient Name: Terri [name] Date of Encounter: 06/18/2021  Primary Care Provider:  Libby Maw, MD Primary Cardiologist:  Quay Burow, MD  Patient Profile    Terri [name] 76 year old female presents the clinic today for evaluation of her fatigue and acid reflux.  Past Medical History    Past Medical History:  Diagnosis Date   Aortic insufficiency    a. Mild AI by echo 2014.   Arthritis    CAD (coronary artery disease)    a. Anterior STEMI 01/2015 s/p overlapping DES x2 to prox LAD, DES to prox RCA in [facility].   Chronic back pain    stenosis/HNP   History of bronchitis 2014   History of colon polyps    Hyperlipidemia    Ischemic cardiomyopathy    Joint pain    Joint swelling    Pneumonia 2014   Smokers' cough ([place])    Stress headaches    Tobacco abuse    Past Surgical History:  Procedure Laterality Date   ABDOMINAL HYSTERECTOMY     at age 24   BREAST BIOPSY Right    roughly 30 years ago with [facility]    cataract surgery Bilateral    CHOLECYSTECTOMY     colonscopy     ERCP     ESOPHAGOGASTRODUODENOSCOPY     LUMBAR LAMINECTOMY Right 07/25/2014   Procedure: Right L4 Hemilaminectomy, Right L4-5 Laminotomy , Decompression, Microdisectomy;  Surgeon: Marybelle Killings, MD;  Location: [facility];  Service: Orthopedics;  Laterality: Right;   SHOULDER SURGERY Right    TONSILLECTOMY     as a child    Allergies  Allergies  Allergen Reactions   Erythromycin     nauseated    History of Present Illness    Terri [name] has a PMH of essential hypertension, hyperlipidemia, coronary artery disease, tobacco use, and ischemic cardiomyopathy.  She has a strong family history of heart disease with both parents and multiple siblings who died or had cardiac related health issues.  She is a retired Insurance claims handler.  She had an anterior wall MI 01/31/2015.  She was at [facility] at that time.  She was taken to [facility] and was found to have an occluded proximal LAD, occluded marginal branch, and a high-grade proximal RCA stenosis.  She underwent cardiac catheterization and received DES overlapping stents to her proximal LAD and her proximal RCA.  Her EF was 25% which improved to 40-45% on her echocardiogram 03/03/2015.  During that time she continued to smoke half pack per day.  She was seen in follow-up by Dr. Gwenlyn Found on 07/31/2020.  During that time she continued to do well.  She continued to smoke half pack per day and was reluctant to stop smoking.  She denies shortness of breath and chest discomfort.  She reported suffering from bulging disks.  She had finished a course of prednisone which was felt to be contributing to her elevated blood pressure.  She presented to the clinic 04/14/2021 for follow-up evaluation and stated over the last 2 months she has noticed increased reflux.  She noted that her symptoms improve with Rolaids.  They also improve when she sat up.  She denied changes in her breathing.  She did note a cough but relates this to her smoking.  She also had noticed increased fatigue over the last 2 months.  She had increased stress related to caring for her husband  who has health issues and has been falling more frequently.  She did not formally exercise.  We reviewed the importance of stopping smoking and she reported that she needed this to help manage her stress.  We reviewed her previous angiography and echocardiogram.  She reported that she has had some elevated blood pressures at home and will 150/70 range.  Initially today in the office is 158/74 and on recheck it was 138/74.  I will increased her lisinopril to 30 mg daily.  I gave her the GERD diet, started pantoprazole, ordered an echocardiogram, and plan follow-up after her testing.  I ordered a CBC and BMP today and repeat BMP in 1 week.  Her echocardiogram showed improved EF and G2 DD .  Details below.  Her lab work was  unremarkable.  She presented to the clinic 05/20/21 for follow-up evaluation stated she continued to care for her husband.  He  needed a ankle surgery and would be nonweightbearing for 2 months.  She was worried about his upcoming surgery.  She reported that he also recently traded in 2 vehicles and obtained a new vehicle, which caused her stress.  She brought in her blood pressure log that shows no improvement in her blood pressure control [id number] systolic blood pressures.  I  increased her lisinopril to 40 mg daily, ordered a BMP in 1 week, gave her the salty 6 diet sheet, had her increase her physical activity as tolerated, and also gave her the mindfulness stress reduction sheet.  We  planned follow-up in 2 months.  I  asked her to contact the office if her blood pressures continue to be elevated.  She expressed understanding.  She was admitted to the hospital 06/11/2021 and discharged on 06/12/2021.  She was admitted with acute respiratory failure and cardiology was consulted.  She was felt to have shortness of breath related to chronic systolic and diastolic CHF.  She was treated with diuresis and doxycycline.  She was started on furosemide 40 mg daily and her chlorthalidone was discontinued.  Her carvedilol was continued.  She was counseled on low-sodium diet.  She was also noted to have elevated troponin levels.  This was felt to be related to demand ischemia.  She presents to the clinic today for follow-up evaluation states she did not do well with her increased dose of carvedilol and fell yesterday at from the center.  She was witnessed by a off-duty paramedic.  She presented to the [facility] where she received a head CT and lab work.  Her head CT was unremarkable.  I will reduce her carvedilol to 6.25 twice daily and have her start her spironolactone.  We discussed stress testing.  She is agreeable to nuclear stress test after her husband has his cardiac catheterization.  I will have her  have a BMP drawn 1 week and plan follow-up for 3 months.   Today she denies chest pain, increased rates of shortness of breath, lower extremity edema, fatigue, palpitations, melena, hematuria, hemoptysis, diaphoresis, weakness, presyncope, syncope, orthopnea, and PND.   Home Medications    Prior to Admission medications   Medication Sig Start Date End Date Taking? Authorizing Provider  albuterol (PROVENTIL HFA;VENTOLIN HFA) 108 (90 Base) MCG/ACT inhaler Inhale 1-2 puffs into the lungs every 6 (six) hours as needed for wheezing or shortness of breath. And or cough. 10/26/17   Libby Maw, MD  atorvastatin (LIPITOR) 80 MG tablet Take 1 tablet (80 mg total) by mouth daily. 07/31/20  Lorretta Harp, MD  carvedilol (COREG) 6.25 MG tablet TAKE 1 TABLET BY MOUTH TWICE DAILY 07/31/20   Lorretta Harp, MD  chlorthalidone (HYGROTON) 25 MG tablet Take 0.5 tablets (12.5 mg total) by mouth daily. 07/31/20 07/26/21  Lorretta Harp, MD  clopidogrel (PLAVIX) 75 MG tablet Take 1 tablet (75 mg total) by mouth daily. 07/31/20   Lorretta Harp, MD  [facility] ASPIRIN 81 MG EC tablet Take 81 mg by mouth daily. 02/02/15   [provider]  diazepam (VALIUM) 5 MG tablet TAKE 2 TABs PO 30 MINS BEFORE MRI Patient not taking: No sig reported 08/21/20   Marybelle Killings, MD  gabapentin (NEURONTIN) 100 MG capsule One at night times 5 days then 2 po q HS 09/16/20   Marybelle Killings, MD  lisinopril (ZESTRIL) 20 MG tablet Take 1 tablet (20 mg total) by mouth daily. 07/31/20   Lorretta Harp, MD  predniSONE (STERAPRED UNI-PAK 21 TAB) 10 MG (21) TBPK tablet Take 6 tablets the first day with food then one less tablet each day, 5,4,3,2,1 then stop. Patient not taking: Reported on 03/05/2021 07/24/20   Marybelle Killings, MD  valACYclovir (VALTREX) 1000 MG tablet Take 1/2 tablet three times daily for 7 days. 04/01/21   Libby Maw, MD    Family History    Family History  Problem Relation Age of Onset    Heart disease Mother    Heart failure Mother    Heart disease Father    Heart failure Father    CAD Other        both parents and multiple siblings with either died or had-related issues   Breast cancer Sister    She indicated that her mother is deceased. She indicated that her father is deceased. She indicated that the status of her sister is unknown. She indicated that the status of her other is unknown.   Social History    Social History   Socioeconomic History   Marital status: Married    Spouse name: Corinthia Helmers   Number of children: Not on file   Years of education: Not on file   Highest education level: Not on file  Occupational History   Occupation: retired  Tobacco Use   Smoking status: Every Day    Packs/day: 0.50    Years: 60.00    Pack years: 30.00    Types: Cigarettes   Smokeless tobacco: Never  Vaping Use   Vaping Use: Never used  Substance and Sexual Activity   Alcohol use: No   Drug use: No   Sexual activity: Yes    Birth control/protection: Surgical  Other Topics Concern   Not on file  Social History Narrative   Not on file   Social Determinants of Health   Financial Resource Strain: Low Risk    Difficulty of Paying Living Expenses: Not hard at all  Food Insecurity: No Food Insecurity   Worried About Charity fundraiser in the Last Year: Never true   [place] in the Last Year: Never true  Transportation Needs: No Transportation Needs   Lack of Transportation (Medical): No   Lack of Transportation (Non-Medical): No  Physical Activity: Inactive   Days of Exercise per Week: 0 days   Minutes of Exercise per Session: 0 min  Stress: No Stress Concern Present   Feeling of Stress : Only a little  Social Connections: Moderately Integrated   Frequency of Communication with Friends and Family:  More than three times a week   Frequency of Social Gatherings with Friends and Family: More than three times a week   Attends Religious Services: 1 to  4 times per year   Active Member of [employer] or Organizations: No   Attends Music therapist: Never   Marital Status: Married  Human resources officer Violence: Not At Risk   Fear of Current or Ex-Partner: No   Emotionally Abused: No   Physically Abused: No   Sexually Abused: No     Review of Systems    General:  No chills, fever, night sweats or weight changes.  Cardiovascular:  No chest pain, dyspnea on exertion, edema, orthopnea, palpitations, paroxysmal nocturnal dyspnea. Dermatological: No rash, lesions/masses Respiratory: No cough, dyspnea Urologic: No hematuria, dysuria Abdominal:   No nausea, vomiting, diarrhea, bright red blood per rectum, melena, or hematemesis Neurologic:  No visual changes, wkns, changes in mental status. All other systems reviewed and are otherwise negative except as noted above.  Physical Exam    VS:  BP (!) 122/58    Pulse 80    Ht 5\' 2"  (1.575 m)    Wt 174 lb 6.4 oz (79.1 kg)    SpO2 99%    BMI 31.90 kg/m  , BMI Body mass index is 31.9 kg/m. GEN: Well nourished, well developed, in no acute distress. HEENT: normal. Neck: Supple, no JVD, carotid bruits, or masses. Cardiac: RRR, no murmurs, rubs, or gallops. No clubbing, cyanosis, edema.  Radials/DP/PT 2+ and equal bilaterally.  Respiratory:  Respirations regular and unlabored, clear to auscultation bilaterally. GI: Soft, nontender, nondistended, BS + x 4. MS: no deformity or atrophy. Skin: warm and dry, no rash. Neuro:  Strength and sensation are intact. Psych: Normal affect.  Accessory Clinical Findings    Recent Labs: 06/11/2021: ALT 15; Magnesium 2.3; TSH 1.402 06/16/2021: B Natriuretic Peptide 300.4 06/17/2021: BUN 24; Creatinine, Ser 1.05; Hemoglobin 11.4; Platelets 284; Potassium 3.8; Sodium 137   Recent Lipid Panel    Component Value Date/Time   CHOL 133 08/14/2020 1010   TRIG 170 (H) 08/14/2020 1010   HDL 41 08/14/2020 1010   CHOLHDL 3.2 08/14/2020 1010   CHOLHDL 3.3 09/14/2016  1210   VLDL 54 (H) 09/14/2016 1210   LDLCALC 63 08/14/2020 1010    ECG personally reviewed by me today-none today.  EKG 04/14/2021 normal sinus rhythm left axis deviation moderate voltage criteria for LVH septal infarct undetermined age 50 bpm- No acute changes  Echocardiogram 04/28/2021 IMPRESSIONS     1. Left ventricular ejection fraction, by estimation, is 50 to 55%. Left  ventricular ejection fraction by 3D volume is 53 %. The left ventricle has  low normal function. The left ventricle has no regional wall motion  abnormalities. There is mild  concentric left ventricular hypertrophy. Left ventricular diastolic  parameters are consistent with Grade II diastolic dysfunction  (pseudonormalization). Elevated left ventricular end-diastolic pressure.   2. Right ventricular systolic function is normal. The right ventricular  size is normal. There is normal pulmonary artery systolic pressure.   3. The mitral valve was not well visualized. Mild to moderate mitral  valve regurgitation.   4. The aortic valve was not well visualized. Aortic valve regurgitation  is mild to moderate.   Echocardiogram 06/11/2021  IMPRESSIONS     1. Poor quality images even with definity septuma and mid/basal inferior  wall hypokinetic. Left ventricular ejection fraction, by estimation, is 40  to 45%. The left ventricle has mildly decreased function. The  left  ventricle demonstrates regional wall  motion abnormalities (see scoring diagram/findings for description). The  left ventricular internal cavity size was mildly dilated.   FINDINGS   Left Ventricle: Poor quality images even with definity septuma and  mid/basal inferior wall hypokinetic. Left ventricular ejection fraction,  by estimation, is 40 to 45%. The left ventricle has mildly decreased  function. The left ventricle demonstrates  regional wall motion abnormalities. The left ventricular internal cavity  size was mildly dilated.   Jenkins Rouge MD  Electronically signed by Jenkins Rouge MD  Signature Date/Time: 06/11/2021/4:21:28 PM       Assessment & Plan   1. Ischemic cardiomyopathy-euvolemic today.  No increased DOE or activity intolerance.  Increasing physical activity since being discharged from the hospital.  Echocardiogram 1/23 showed EF 40-45%.  Recently seen by advanced heart failure team.  Uptitrated carvedilol and recommended addition of spironolactone.  Would recommend discontinuing lisinopril and starting Entresto and Farxiga in the future.  We will hold off on further medication changes at this time given recent advanced heart failure team modifications.  Did not tolerate carvedilol 12.5 mg twice daily and had a mechanical fall.  We will reduce to 6.25 mg twice daily Continue carvedilol, lisinopril to 40 mg, furosemide, spironolactone, lisinopril Start spironolactone Heart healthy low-sodium diet-salty 6 given Increase physical activity as tolerated BMP today and in 1 week. Repeat echocardiogram medications been optimized. Order nuclear stress test-agreeable to stress testing in 2 months.  GERD-improved with the addition of Protonix.  Had been noticing increased reflux over the past 2 months.  Continue pantoprazole GERD diet Smoking cessation Increase physical activity as tolerated   Coronary artery disease-denies chest pain.   Noted to have elevated troponins during recent admission felt to be related to demand ischemia.   Continue carvedilol, atorvastatin, Plavix, lisinopril, aspirin Heart healthy low-sodium diet-salty 6 given Increase physical activity as tolerated  Hyperlipidemia-08/14/2020: Cholesterol, Total 133; HDL 41; LDL Chol Calc (NIH) 63; Triglycerides 170  Continue atorvastatin, aspirin Heart healthy low-sodium diet-salty 6 given Increase physical activity as tolerated Repeat fasting lipids and LFTs 3/23  Essential hypertension-BP today 122/58.  Continues to be elevated at home in the 299-371  systolic range. Continue carvedilol, lisinopril Heart healthy low-sodium diet-salty 6 given Increase physical activity as tolerated Continue to maintain blood pressure log Repeat BMP in 1 week  Tobacco use-continues to smoke half pack per day. Smoking cessation recommended Smoking cessation information offered  Disposition: Follow-up with Dr. Gwenlyn Found or me 3 months.   Jossie Ng. Trygve Thal NP-C    06/18/2021, 2:51 PM [facility] [address] Office [phone] Fax [phone]  Notice: This dictation was prepared with Dragon dictation along with smaller phrase technology. Any transcriptional errors that result from this process are unintentional and may not be corrected upon review.  I spent 15 minutes examining this patient, reviewing medications, and using patient centered shared decision making involving her cardiac care.  Prior to her visit I spent greater than 20 minutes reviewing her past medical history,  medications, and prior cardiac tests.

## 2021-06-15 NOTE — Progress Notes (Signed)
HEART & VASCULAR TRANSITION OF CARE CONSULT NOTE     Referring Physician: Dr. Harrell Gave  Primary Care: Libby Maw, MD Primary Cardiologist: Quay Burow, MD   HPI: Referred to clinic by Dr. Harrell Gave for heart failure consultation.   76 y/o female w/ h/o systolic heart failure/ischemic CM, CAD, HTN, HLD, AI and tobacco abuse. Had anterior STEMI in 2016 while on vacation in [place]. Underwent PCI + DES to pLAD and RCA. EF was reportedly low but improved on subsequent echo, up to 40-45% on repeat 02/2015. Most recent echo 11/22 showed EF had normalized to 50-55%, w/ G2DD, mild-mod MR and mild to mod AI.   Recently admitted to [facility] 1/23 w/ dyspnea and found to be hypoxic requiring supp O2. BNP 451.4.  Serial troponin 39-->48-->71.  White blood cell count mildly elevated 11.9.  Chest x-ray showed a bibasilar atelectasis or infiltrate, no edema or substantial pleural effusion. PCT <0.10.  COVID and flu negative. Started on breathing tx and abx coverage w/ doxy for possible bronchitis as well as IV Lasix for suspected acute CHF component w/ improvement in symptoms and oxygenation.   Limited echo showed moderately reduced LVEF, 40-45%, though poor quality study. After diuresis w/ IV Lasix was transitioned to PO diuretics, lisinopril and coreg and referred to [facility] clinic.   She reports improved symptoms. Breathing much improved. No limitation w/ ALDs. Denies resting dyspnea. No LEE, orthopnea/PND. Checking wt daily at home. No weight gain. Denies CP. Still smoking cigarettes. BP moderately elevated today.    Cardiac Testing  Poor quality images even with definity septuma and mid/basal inferior wall hypokinetic. Left ventricular ejection fraction, by estimation, is 40 to 45%. The left ventricle has mildly decreased function. The left ventricle demonstrates regional wall motion abnormalities (see scoring diagram/findings for description). The left ventricular internal cavity size was  mildly dilated.  Review of Systems: [y] = yes, [ ]  = no   General: Weight gain [ ] ; Weight loss [ ] ; Anorexia [ ] ; Fatigue [ ] ; Fever [ ] ; Chills [ ] ; Weakness [ ]   Cardiac: Chest pain/pressure [ ] ; Resting SOB [ ] ; Exertional SOB [ ] ; Orthopnea [ ] ; Pedal Edema [ ] ; Palpitations [ ] ; Syncope [ ] ; Presyncope [ ] ; Paroxysmal nocturnal dyspnea[ ]   Pulmonary: Cough [ ] ; Wheezing[ ] ; Hemoptysis[ ] ; Sputum [ ] ; Snoring [ ]   GI: Vomiting[ ] ; Dysphagia[ ] ; Melena[ ] ; Hematochezia [ ] ; Heartburn[ ] ; Abdominal pain [ ] ; Constipation [ ] ; Diarrhea [ ] ; BRBPR [ ]   GU: Hematuria[ ] ; Dysuria [ ] ; Nocturia[ ]   Vascular: Pain in legs with walking [ ] ; Pain in feet with lying flat [ ] ; Non-healing sores [ ] ; Stroke [ ] ; TIA [ ] ; Slurred speech [ ] ;  Neuro: Headaches[ ] ; Vertigo[ ] ; Seizures[ ] ; Paresthesias[ ] ;Blurred vision [ ] ; Diplopia [ ] ; Vision changes [ ]   Ortho/Skin: Arthritis [ ] ; Joint pain [ ] ; Muscle pain [ ] ; Joint swelling [ ] ; Back Pain [ ] ; Rash [ ]   Psych: Depression[ ] ; Anxiety[ ]   Heme: Bleeding problems [ ] ; Clotting disorders [ ] ; Anemia [ ]   Endocrine: Diabetes [ ] ; Thyroid dysfunction[ ]    Past Medical History:  Diagnosis Date   Aortic insufficiency    a. Mild AI by echo 2014.   Arthritis    CAD (coronary artery disease)    a. Anterior STEMI 01/2015 s/p overlapping DES x2 to prox LAD, DES to prox RCA in [facility]   Chronic back  pain    stenosis/HNP   History of bronchitis 2014   History of colon polyps    Hyperlipidemia    Ischemic cardiomyopathy    Joint pain    Joint swelling    Pneumonia 2014   Smokers' cough (HCC)    Stress headaches    Tobacco abuse     Current Outpatient Medications  Medication Sig Dispense Refill   albuterol (PROVENTIL HFA;VENTOLIN HFA) 108 (90 Base) MCG/ACT inhaler Inhale 1-2 puffs into the lungs every 6 (six) hours as needed for wheezing or shortness of breath. And or cough. 1 Inhaler 0   atorvastatin (LIPITOR) 80 MG tablet  Take 1 tablet (80 mg total) by mouth daily. 90 tablet 3   clopidogrel (PLAVIX) 75 MG tablet Take 1 tablet (75 mg total) by mouth daily. 90 tablet 3   [facility] ASPIRIN 81 MG EC tablet Take 81 mg by mouth daily.  0   furosemide (LASIX) 40 MG tablet Take 1 tablet (40 mg total) by mouth daily. 30 tablet 0   lisinopril (ZESTRIL) 40 MG tablet Take 1 tablet (40 mg total) by mouth daily. 30 tablet 6   pantoprazole (PROTONIX) 20 MG tablet Take 1 tablet (20 mg total) by mouth daily. 30 tablet 3   carvedilol (COREG) 12.5 MG tablet TAKE 1 TABLET BY MOUTH TWICE DAILY 60 tablet 0   No current facility-administered medications for this encounter.    Allergies  Allergen Reactions   Erythromycin     nauseated      Social History   Socioeconomic History   Marital status: Married    Spouse name: Abria Vannostrand   Number of children: Not on file   Years of education: Not on file   Highest education level: Not on file  Occupational History   Occupation: retired  Tobacco Use   Smoking status: Every Day    Packs/day: 0.50    Years: 60.00    Pack years: 30.00    Types: Cigarettes   Smokeless tobacco: Never  Vaping Use   Vaping Use: Never used  Substance and Sexual Activity   Alcohol use: No   Drug use: No   Sexual activity: Yes    Birth control/protection: Surgical  Other Topics Concern   Not on file  Social History Narrative   Not on file   Social Determinants of Health   Financial Resource Strain: Low Risk    Difficulty of Paying Living Expenses: Not hard at all  Food Insecurity: No Food Insecurity   Worried About Charity fundraiser in the Last Year: Never true   [place] in the Last Year: Never true  Transportation Needs: No Transportation Needs   Lack of Transportation (Medical): No   Lack of Transportation (Non-Medical): No  Physical Activity: Inactive   Days of Exercise per Week: 0 days   Minutes of Exercise per Session: 0 min  Stress: No Stress Concern Present   Feeling  of Stress : Only a little  Social Connections: Moderately Integrated   Frequency of Communication with Friends and Family: More than three times a week   Frequency of Social Gatherings with Friends and Family: More than three times a week   Attends Religious Services: 1 to 4 times per year   Active Member of [employer] or Organizations: No   Attends Archivist Meetings: Never   Marital Status: Married  Human resources officer Violence: Not At Risk   Fear of Current or Ex-Partner: No   Emotionally  Abused: No   Physically Abused: No   Sexually Abused: No      Family History  Problem Relation Age of Onset   Heart disease Mother    Heart failure Mother    Heart disease Father    Heart failure Father    CAD Other        both parents and multiple siblings with either died or had-related issues   Breast cancer Sister     Vitals:   06/16/21 1503  BP: 140/80  Pulse: 70  SpO2: 97%  Weight: 80.7 kg (178 lb)    PHYSICAL EXAM: General:  Well appearing. No respiratory difficulty HEENT: normal Neck: supple. no JVD. Carotids 2+ bilat; no bruits. No lymphadenopathy or thryomegaly appreciated. Cor: PMI nondisplaced. Regular rate & rhythm. No rubs, gallops or murmurs. Lungs: clear Abdomen: soft, nontender, nondistended. No hepatosplenomegaly. No bruits or masses. Good bowel sounds. Extremities: no cyanosis, clubbing, rash, edema Neuro: alert & oriented x 3, cranial nerves grossly intact. moves all 4 extremities w/o difficulty. Affect pleasant.  ECG: not performed    ASSESSMENT & PLAN:  76 y/o female w/ history HFrEF in the setting of ischemic heart disease. EF previously ~20% in 2016 in the setting of obstructive pLAD disease, s/p PCI/ stenting with ultimate improvement in LVEF, normalized on echo 04/2019, EF 50-55%.   EF now back to 40-45%, but denies any recent h/o ischemic like CP.  She is euvolemic on exam today w/ NYHA Class II symptoms. BP moderately elevated. She is followed  closely by Dr. Gwenlyn Found and has f/u in his clinic later this week.   We will help to optimize meds in [facility] clinic and will defer ischemic w/u (NST vs cath) to Dr. Gwenlyn Found, to assess for coronary ischemia as potential drop in EF.    She is currently on lisinopril and Coreg. We discussed transition to [facility]. She would need ACEI washout. We discussed transition to Losartan as bridge but she just refilled new Rx of Lisinopril and would not like to switch regimen yet today. Will plan to switch to losartan and eventual Entresto in the near future (will coordinate w/ Dr. Kennon Holter office). Check BMP today. If SCr/K ok, will start spiro 12.5 mg daily. Increase Coreg to 12.5 mg bid. Continue Lasix 40 mg daily.   We will bring her back for a 2nd visit in [facility] clinic in 4 weeks. Keep f/u w/ Coletta Memos, NP, on 1/13 to discuss potential for ischemic eval w/ Dr. Gwenlyn Found.    NYHA II GDMT  Diuretic- Lasix 40 mg daily  BB- Coreg 12.5 mg bid  Ace/ARB/ARNI Lisinopril 40 (eventual Entresto)  MRA Spiro 12.5 mg next (pending renal fx/SCr)  SGLT2i - not yet    Referred to HFSW (PCP, Medications, Transportation, ETOH Abuse, Drug Abuse, Insurance, Financial ): No  Refer to Pharmacy: No Refer to Home Health: No Refer to Advanced Heart Failure Clinic: No  Refer to General Cardiology: Yes (Dr. Gwenlyn Found)   Follow up in [facility] clinic in 4 weeks

## 2021-06-16 ENCOUNTER — Ambulatory Visit (HOSPITAL_COMMUNITY)
Admit: 2021-06-16 | Discharge: 2021-06-16 | Disposition: A | Discharge status: Home / Self Care | Payer: Medicare Other | Attending: Cardiology | Admitting: Cardiology

## 2021-06-16 ENCOUNTER — Encounter (HOSPITAL_COMMUNITY): Payer: Self-pay

## 2021-06-16 ENCOUNTER — Other Ambulatory Visit: Payer: Self-pay

## 2021-06-16 VITALS — BP 140/80 | HR 70 | Wt 178.0 lb

## 2021-06-16 DIAGNOSIS — Z955 Presence of coronary angioplasty implant and graft: Secondary | ICD-10-CM | POA: Insufficient documentation

## 2021-06-16 DIAGNOSIS — I2510 Atherosclerotic heart disease of native coronary artery without angina pectoris: Secondary | ICD-10-CM | POA: Insufficient documentation

## 2021-06-16 DIAGNOSIS — I110 Hypertensive heart disease with heart failure: Secondary | ICD-10-CM | POA: Insufficient documentation

## 2021-06-16 DIAGNOSIS — I252 Old myocardial infarction: Secondary | ICD-10-CM | POA: Insufficient documentation

## 2021-06-16 DIAGNOSIS — Z79899 Other long term (current) drug therapy: Secondary | ICD-10-CM | POA: Diagnosis not present

## 2021-06-16 DIAGNOSIS — F17210 Nicotine dependence, cigarettes, uncomplicated: Secondary | ICD-10-CM | POA: Diagnosis not present

## 2021-06-16 DIAGNOSIS — I5022 Chronic systolic (congestive) heart failure: Secondary | ICD-10-CM | POA: Diagnosis not present

## 2021-06-16 DIAGNOSIS — E785 Hyperlipidemia, unspecified: Secondary | ICD-10-CM | POA: Insufficient documentation

## 2021-06-16 LAB — BASIC METABOLIC PANEL
Anion gap: 12 (ref 5–15)
BUN: 27 mg/dL — ABNORMAL HIGH (ref 8–23)
CO2: 23 mmol/L (ref 22–32)
Calcium: 8.7 mg/dL — ABNORMAL LOW (ref 8.9–10.3)
Chloride: 99 mmol/L (ref 98–111)
Creatinine, Ser: 1.16 mg/dL — ABNORMAL HIGH (ref 0.44–1.00)
GFR, Estimated: 49 mL/min — ABNORMAL LOW (ref >60)
Glucose, Bld: 100 mg/dL — ABNORMAL HIGH (ref 70–99)
Potassium: 3.9 mmol/L (ref 3.5–5.1)
Sodium: 134 mmol/L — ABNORMAL LOW (ref 135–145)

## 2021-06-16 LAB — BRAIN NATRIURETIC PEPTIDE: B Natriuretic Peptide: 300.4 pg/mL — ABNORMAL HIGH (ref 0.0–100.0)

## 2021-06-16 MED ORDER — CARVEDILOL 12.5 MG PO TABS: ORAL_TABLET | ORAL | 0 refills | Status: DC

## 2021-06-16 NOTE — Patient Instructions (Signed)
Increase Carvedilol to 12.5 mg Twice daily   Labs done today, we will call you for abnormal results  Thank you for allowing [place] to provider your heart failure care after your recent hospitalization. Please follow-up with [facility] as scheduled on 06/18/21 and out our [facility] again 4 weeks  If you have any questions, issues, or concerns before your next appointment please call our office at [phone], opt. 2 and leave a message for the triage nurse.  Do the following things EVERYDAY: Weigh yourself in the morning before breakfast. Write it down and keep it in a log. Take your medicines as prescribed Eat low salt foods--Limit salt (sodium) to 2000 mg per day.  Stay as active as you can everyday Limit all fluids for the day to less than 2 liters

## 2021-06-17 ENCOUNTER — Other Ambulatory Visit: Payer: Self-pay

## 2021-06-17 ENCOUNTER — Encounter (HOSPITAL_BASED_OUTPATIENT_CLINIC_OR_DEPARTMENT_OTHER): Payer: Self-pay | Admitting: Emergency Medicine

## 2021-06-17 ENCOUNTER — Emergency Department (HOSPITAL_BASED_OUTPATIENT_CLINIC_OR_DEPARTMENT_OTHER): Payer: Medicare Other

## 2021-06-17 ENCOUNTER — Emergency Department (HOSPITAL_BASED_OUTPATIENT_CLINIC_OR_DEPARTMENT_OTHER)
Admission: EM | Admit: 2021-06-17 | Discharge: 2021-06-17 | Disposition: A | Discharge status: Home / Self Care | Payer: Medicare Other | Attending: Emergency Medicine | Admitting: Emergency Medicine

## 2021-06-17 DIAGNOSIS — S0001XA Abrasion of scalp, initial encounter: Secondary | ICD-10-CM | POA: Diagnosis not present

## 2021-06-17 DIAGNOSIS — W1839XA Other fall on same level, initial encounter: Secondary | ICD-10-CM | POA: Insufficient documentation

## 2021-06-17 DIAGNOSIS — S0990XA Unspecified injury of head, initial encounter: Secondary | ICD-10-CM | POA: Diagnosis not present

## 2021-06-17 DIAGNOSIS — Z79899 Other long term (current) drug therapy: Secondary | ICD-10-CM | POA: Diagnosis not present

## 2021-06-17 DIAGNOSIS — I110 Hypertensive heart disease with heart failure: Secondary | ICD-10-CM | POA: Insufficient documentation

## 2021-06-17 DIAGNOSIS — J45909 Unspecified asthma, uncomplicated: Secondary | ICD-10-CM | POA: Diagnosis not present

## 2021-06-17 DIAGNOSIS — I509 Heart failure, unspecified: Secondary | ICD-10-CM | POA: Diagnosis not present

## 2021-06-17 DIAGNOSIS — W228XXA Striking against or struck by other objects, initial encounter: Secondary | ICD-10-CM | POA: Insufficient documentation

## 2021-06-17 DIAGNOSIS — Z7902 Long term (current) use of antithrombotics/antiplatelets: Secondary | ICD-10-CM | POA: Insufficient documentation

## 2021-06-17 DIAGNOSIS — W19XXXA Unspecified fall, initial encounter: Secondary | ICD-10-CM

## 2021-06-17 DIAGNOSIS — R55 Syncope and collapse: Secondary | ICD-10-CM | POA: Diagnosis not present

## 2021-06-17 DIAGNOSIS — Z043 Encounter for examination and observation following other accident: Secondary | ICD-10-CM | POA: Diagnosis not present

## 2021-06-17 LAB — CBC
HCT: 34.9 % — ABNORMAL LOW (ref 36.0–46.0)
Hemoglobin: 11.4 g/dL — ABNORMAL LOW (ref 12.0–15.0)
MCH: 28.9 pg (ref 26.0–34.0)
MCHC: 32.7 g/dL (ref 30.0–36.0)
MCV: 88.6 fL (ref 80.0–100.0)
Platelets: 284 10*3/uL (ref 150–400)
RBC: 3.94 MIL/uL (ref 3.87–5.11)
RDW: 15 % (ref 11.5–15.5)
WBC: 12.4 10*3/uL — ABNORMAL HIGH (ref 4.0–10.5)
nRBC: 0 % (ref 0.0–0.2)

## 2021-06-17 LAB — BASIC METABOLIC PANEL
Anion gap: 10 (ref 5–15)
BUN: 24 mg/dL — ABNORMAL HIGH (ref 8–23)
CO2: 24 mmol/L (ref 22–32)
Calcium: 8.9 mg/dL (ref 8.9–10.3)
Chloride: 103 mmol/L (ref 98–111)
Creatinine, Ser: 1.05 mg/dL — ABNORMAL HIGH (ref 0.44–1.00)
GFR, Estimated: 56 mL/min — ABNORMAL LOW (ref >60)
Glucose, Bld: 92 mg/dL (ref 70–99)
Potassium: 3.8 mmol/L (ref 3.5–5.1)
Sodium: 137 mmol/L (ref 135–145)

## 2021-06-17 LAB — URINALYSIS, ROUTINE W REFLEX MICROSCOPIC
Bilirubin Urine: NEGATIVE
Glucose, UA: NEGATIVE mg/dL
Hgb urine dipstick: NEGATIVE
Ketones, ur: NEGATIVE mg/dL
Leukocytes,Ua: NEGATIVE
Nitrite: NEGATIVE
Protein, ur: NEGATIVE mg/dL
Specific Gravity, Urine: 1.005 (ref 1.005–1.030)
pH: 5.5 (ref 5.0–8.0)

## 2021-06-17 LAB — CBG MONITORING, ED: Glucose-Capillary: 97 mg/dL (ref 70–99)

## 2021-06-17 NOTE — ED Triage Notes (Addendum)
[address] [address] [place] hit her head, on bloodthinner , plavix , no loc  small bump to back of head  no n/v  no sob she states had her bp meds changed  increased and she felt not right and legs felt funny

## 2021-06-17 NOTE — Discharge Instructions (Signed)
You have been seen and discharged from the emergency department.  Your blood work and CT imaging were negative.  Go back to taking 1 pill of your carvedilol.  Follow-up with cardiology and Dr. Alvester Chou tomorrow.  Follow-up with your primary provider for further evaluation and further care. Take home medications as prescribed. If you have any worsening symptoms or further concerns for your health please return to an emergency department for further evaluation.

## 2021-06-17 NOTE — ED Notes (Signed)
Pt was given soda and crackers to snack on and tolerated well.

## 2021-06-17 NOTE — ED Provider Notes (Signed)
[facility] EMERGENCY DEPT Provider Note   CSN: [id number] Arrival date & time: 06/17/21  1112     History  Chief Complaint  Patient presents with   Fall   Near Syncope    Terri [name] is a 76 y.o. female.  HPI  76 year old female with past medical history of HTN, HLD, asthma, CHF presents the emergency department with fall and head injury.  She is on Plavix.  Patient states that she was leaving a store when she got to her car she started to feel off balance, she fell backwards and hitting the back of her head on the pavement.  No loss of consciousness.  No other injury sustained.  She does not believe that she passed out or had seizure-like activity.  Patient did have a recent admission for CHF, she was seen at cardiac rehab.  Yesterday they increased her carvedilol to twice the dose, this morning was the first time that she took that dose.  Denies any chest pain, light headedness, shortness of breath, back pain.  Home Medications Prior to Admission medications   Medication Sig Start Date End Date Taking? Authorizing Provider  albuterol (PROVENTIL HFA;VENTOLIN HFA) 108 (90 Base) MCG/ACT inhaler Inhale 1-2 puffs into the lungs every 6 (six) hours as needed for wheezing or shortness of breath. And or cough. 10/26/17  Yes Libby Maw, MD  atorvastatin (LIPITOR) 80 MG tablet Take 1 tablet (80 mg total) by mouth daily. 07/31/20  Yes Lorretta Harp, MD  carvedilol (COREG) 12.5 MG tablet TAKE 1 TABLET BY MOUTH TWICE DAILY 06/16/21  Yes Lyda Jester M, PA-C  clopidogrel (PLAVIX) 75 MG tablet Take 1 tablet (75 mg total) by mouth daily. 07/31/20  Yes Lorretta Harp, MD  [facility] ASPIRIN 81 MG EC tablet Take 81 mg by mouth daily. 02/02/15  Yes [provider]  furosemide (LASIX) 40 MG tablet Take 1 tablet (40 mg total) by mouth daily. 06/12/21 07/12/21 Yes Nolberto Hanlon, MD  lisinopril (ZESTRIL) 40 MG tablet Take 1 tablet (40 mg total) by mouth daily. 05/20/21   Yes Deberah Pelton, NP  pantoprazole (PROTONIX) 20 MG tablet Take 1 tablet (20 mg total) by mouth daily. 04/14/21  Yes Cleaver, Jossie Ng, NP      Allergies    Erythromycin    Review of Systems   Review of Systems  Constitutional:  Positive for fatigue. Negative for fever.  Respiratory:  Negative for chest tightness and shortness of breath.   Cardiovascular:  Negative for chest pain, palpitations and leg swelling.  Gastrointestinal:  Negative for abdominal pain, diarrhea and vomiting.  Musculoskeletal:  Negative for back pain and neck pain.  Skin:  Negative for rash.  Neurological:  Negative for dizziness, seizures, syncope, weakness and headaches.   Physical Exam Updated Vital Signs BP 131/61    Pulse 65    Temp 98.2 F (36.8 C)    Resp 18    Ht 5\' 2"  (1.575 m)    Wt 80.7 kg    SpO2 96%    BMI 32.54 kg/m  Physical Exam Vitals and nursing note reviewed.  Constitutional:      Appearance: Normal appearance.  HENT:     Head: Normocephalic.     Comments: Small abrasion/bump to the posterior scalp    Mouth/Throat:     Mouth: Mucous membranes are moist.  Eyes:     Pupils: Pupils are equal, round, and reactive to light.  Cardiovascular:     Rate and  Rhythm: Normal rate.  Pulmonary:     Effort: Pulmonary effort is normal. No respiratory distress.  Abdominal:     Palpations: Abdomen is soft.     Tenderness: There is no abdominal tenderness.  Musculoskeletal:        General: No swelling.     Cervical back: No rigidity.  Skin:    General: Skin is warm.  Neurological:     Mental Status: She is alert and oriented to person, place, and time. Mental status is at baseline.  Psychiatric:        Mood and Affect: Mood normal.    ED Results / Procedures / Treatments   Labs (all labs ordered are listed, but only abnormal results are displayed) Labs Reviewed  BASIC METABOLIC PANEL - Abnormal; Notable for the following components:      Result Value   BUN 24 (*)    Creatinine, Ser  1.05 (*)    GFR, Estimated 56 (*)    All other components within normal limits  CBC - Abnormal; Notable for the following components:   WBC 12.4 (*)    Hemoglobin 11.4 (*)    HCT 34.9 (*)    All other components within normal limits  URINALYSIS, ROUTINE W REFLEX MICROSCOPIC - Abnormal; Notable for the following components:   Color, Urine COLORLESS (*)    All other components within normal limits  CBG MONITORING, ED    EKG None  Radiology CT Head Wo Contrast  Result Date: 06/17/2021 CLINICAL DATA:  [place], struck her head, on Plavix, no loss of consciousness EXAM: CT HEAD WITHOUT CONTRAST CT CERVICAL SPINE WITHOUT CONTRAST TECHNIQUE: Multidetector CT imaging of the head and cervical spine was performed following the standard protocol without intravenous contrast. Multiplanar CT image reconstructions of the cervical spine were also generated. RADIATION DOSE REDUCTION: This exam was performed according to the departmental dose-optimization program which includes automated exposure control, adjustment of the mA and/or kV according to patient size and/or use of iterative reconstruction technique. COMPARISON:  None. FINDINGS: CT HEAD FINDINGS Brain: Minimal atrophy. Normal ventricular morphology. No midline shift or mass effect. Small vessel chronic ischemic changes of deep cerebral white matter. No intracranial hemorrhage, mass lesion, or evidence of acute infarction. No extra-axial fluid collections. Vascular: Atherosclerotic calcification of internal carotid and vertebral arteries at skull base. No hyperdense vessels. Skull: Intact Sinuses/Orbits: Clear Other: N/A CT CERVICAL SPINE FINDINGS Alignment: Normal Skull base and vertebrae: Vertebral body heights maintained. Mild disc space narrowing C5-C6, less C6-C7. No fracture, subluxation, or bone destruction. Facet alignments normal. Skull base intact. Soft tissues and spinal canal: Prevertebral soft tissues normal  thickness. Atherosclerotic calcifications at carotid bifurcations and proximal great vessels. Disc levels:  No specific abnormalities Upper chest: Mild paraseptal emphysema at RIGHT apex. Apices otherwise clear. Other: N/A IMPRESSION: Atrophy with small vessel chronic ischemic changes of deep cerebral white matter. No acute intracranial abnormalities. Mild degenerative disc disease changes at C5-C6 and C6-C7. No acute cervical spine abnormalities. Electronically Signed   By: Lavonia Dana M.D.   On: 06/17/2021 12:27   CT Cervical Spine Wo Contrast  Result Date: 06/17/2021 CLINICAL DATA:  [place] [address] [place], struck her head, on Plavix, no loss of consciousness EXAM: CT HEAD WITHOUT CONTRAST CT CERVICAL SPINE WITHOUT CONTRAST TECHNIQUE: Multidetector CT imaging of the head and cervical spine was performed following the standard protocol without intravenous contrast. Multiplanar CT image reconstructions of the cervical spine were also generated. RADIATION  DOSE REDUCTION: This exam was performed according to the departmental dose-optimization program which includes automated exposure control, adjustment of the mA and/or kV according to patient size and/or use of iterative reconstruction technique. COMPARISON:  None. FINDINGS: CT HEAD FINDINGS Brain: Minimal atrophy. Normal ventricular morphology. No midline shift or mass effect. Small vessel chronic ischemic changes of deep cerebral white matter. No intracranial hemorrhage, mass lesion, or evidence of acute infarction. No extra-axial fluid collections. Vascular: Atherosclerotic calcification of internal carotid and vertebral arteries at skull base. No hyperdense vessels. Skull: Intact Sinuses/Orbits: Clear Other: N/A CT CERVICAL SPINE FINDINGS Alignment: Normal Skull base and vertebrae: Vertebral body heights maintained. Mild disc space narrowing C5-C6, less C6-C7. No fracture, subluxation, or bone destruction. Facet alignments normal. Skull base  intact. Soft tissues and spinal canal: Prevertebral soft tissues normal thickness. Atherosclerotic calcifications at carotid bifurcations and proximal great vessels. Disc levels:  No specific abnormalities Upper chest: Mild paraseptal emphysema at RIGHT apex. Apices otherwise clear. Other: N/A IMPRESSION: Atrophy with small vessel chronic ischemic changes of deep cerebral white matter. No acute intracranial abnormalities. Mild degenerative disc disease changes at C5-C6 and C6-C7. No acute cervical spine abnormalities. Electronically Signed   By: Lavonia Dana M.D.   On: 06/17/2021 12:27    Procedures Procedures    Medications Ordered in ED Medications - No data to display  ED Course/ Medical Decision Making/ A&P                           Medical Decision Making   This patient presents to the ED for concern of fall and head injury, this involves an extensive number of treatment options, and is a complaint that carries with it a high risk of complications and morbidity.  The differential diagnosis includes head injury, concussion, ICH, syncope   Additional history obtained: -Additional history obtained from daughter at bedside -External records from outside source obtained and reviewed including: Chart review including previous notes, labs, imaging, consultation notes   Lab Tests: -I ordered, reviewed, and interpreted labs.  The pertinent results include: Baseline lab findings   EKG -Sinus rhythm   Imaging Studies ordered: -I ordered imaging studies including head CT/cervical spine CT -I independently visualized and interpreted imaging which showed no acute finding -I agree with the radiologist interpretation   ED Course: 76 year old female presents emergency department with fall and head injury.  No loss of consciousness or syncope.  Vitals are stable on arrival.  This morning was her first dose of taking her double carvedilol dose.  I wonder if this could have been a factor in  regards to transient hypotension/bradycardia.  Recent admission for CHF.  Vital signs here are normal.  Physical exam is reassuring.  Blood work is baseline, CT imaging is negative.  I do not believe that this is true syncope to be considered in the setting of CHF.  This sounds more mechanical.  Patient has follow-up appointment with Dr. Alvester Chou, cardiology tomorrow.  Have instructed her to go back to her baseline carvedilol dose and to be extra cautious in terms of positional changes and ambulation.  Patient and daughter prefer to be discharged home to follow-up with cardiology tomorrow and again I do not appreciate an acute cardiac/pulmonary/syncope aspect of her current presentation.   Cardiac Monitoring: The patient was maintained on a cardiac monitor.  I personally viewed and interpreted the cardiac monitored which showed an underlying rhythm of: Sinus rhythm   Reevaluation: After the  interventions noted above, I reevaluated the patient and found that they have :improved   Dispostion: Patient at this time appears safe and stable for discharge and close outpatient follow up. Discharge plan and strict return to ED precautions discussed, patient verbalizes understanding and agreement.        Final Clinical Impression(s) / ED Diagnoses Final diagnoses:  Fall, initial encounter  Injury of head, initial encounter    Rx / DC Orders ED Discharge Orders     None         Lorelle Gibbs, DO 06/17/21 1520

## 2021-06-18 ENCOUNTER — Ambulatory Visit (INDEPENDENT_AMBULATORY_CARE_PROVIDER_SITE_OTHER): Payer: Medicare Other | Admitting: General Practice

## 2021-06-18 ENCOUNTER — Encounter: Payer: Self-pay | Admitting: General Practice

## 2021-06-18 VITALS — BP 122/58 | HR 80 | Ht 62.0 in | Wt 174.4 lb

## 2021-06-18 DIAGNOSIS — I255 Ischemic cardiomyopathy: Secondary | ICD-10-CM

## 2021-06-18 DIAGNOSIS — K219 Gastro-esophageal reflux disease without esophagitis: Secondary | ICD-10-CM

## 2021-06-18 DIAGNOSIS — I10 Essential (primary) hypertension: Secondary | ICD-10-CM | POA: Diagnosis not present

## 2021-06-18 DIAGNOSIS — Z720 Tobacco use: Secondary | ICD-10-CM | POA: Diagnosis not present

## 2021-06-18 DIAGNOSIS — E782 Mixed hyperlipidemia: Secondary | ICD-10-CM

## 2021-06-18 DIAGNOSIS — Z79899 Other long term (current) drug therapy: Secondary | ICD-10-CM

## 2021-06-18 DIAGNOSIS — I2510 Atherosclerotic heart disease of native coronary artery without angina pectoris: Secondary | ICD-10-CM

## 2021-06-18 MED ORDER — SPIRONOLACTONE 25 MG PO TABS: 25.0000 mg | ORAL_TABLET | Freq: Every day | ORAL | 3 refills | Status: DC

## 2021-06-18 MED ORDER — CARVEDILOL 6.25 MG PO TABS
6.2500 mg | ORAL_TABLET | Freq: Two times a day (BID) | ORAL | 6 refills | Status: DC
Start: 2021-06-18 — End: 2021-08-31

## 2021-06-18 NOTE — Patient Instructions (Signed)
Medication Instructions:  DECREASE CARVEDILOL 6.25MG  TWICE DAILY  SPIRONOLACTONE 12.5MG  DAILY  *If you need a refill on your cardiac medications before your next appointment, please call your pharmacy*  Lab Work: BMET IN 1 WEEK If you have labs (blood work) drawn today and your tests are completely normal, you will receive your results only by:  [place] (if you have MyChart) OR A paper copy in the mail.  If you have any lab test that is abnormal or we need to change your treatment, we will call you to review the results. You may go to any [facility] that is convenient for you however, we do have a lab in our office that is able to assist you. You DO NOT need an appointment for our lab. The lab is open 8:00am and closes at 4:00pm. Lunch 12:45 - 1:45pm.  Testing/Procedures: Your physician has requested that you have a [place] 2 MONTHS. For further information please visit [email or web address]. Please follow instruction sheet, as given.  Follow-Up: Your next appointment:  AFTER [place]  In Person with Quay Burow, MD   At [facility], you and your health needs are our priority.  As part of our continuing mission to provide you with exceptional heart care, we have created designated Provider Care Teams.  These Care Teams include your primary Cardiologist (physician) and Advanced Practice Providers (APPs -  Physician Assistants and Nurse Practitioners) who all work together to provide you with the care you need, when you need it.  We recommend signing up for the patient portal called "MyChart".  Sign up information is provided on this After Visit Summary.  MyChart is used to connect with patients for Virtual Visits (Telemedicine).  Patients are able to view lab/test results, encounter notes, upcoming appointments, etc.  Non-urgent messages can be sent to your provider as well.   To learn more about what you can do with MyChart, go to [email or web address].

## 2021-06-22 ENCOUNTER — Encounter (HOSPITAL_COMMUNITY): Payer: Self-pay

## 2021-06-24 ENCOUNTER — Telehealth: Payer: Self-pay | Admitting: Cardiovascular Disease

## 2021-06-24 NOTE — Telephone Encounter (Signed)
Spoke with pt, answered any questions. No further questions. Informed pt to test ASAP if she thinks that she has COVID and contact PCP, verbalized understanding.

## 2021-06-24 NOTE — Telephone Encounter (Signed)
Patient called to say that her husband has tested positive for Covid. She is calling to see if there anything that she can do/take to keep her from catching it. Please advise

## 2021-06-25 ENCOUNTER — Telehealth: Payer: Self-pay | Admitting: Family Medicine

## 2021-06-25 NOTE — Telephone Encounter (Signed)
Please advise message below, patient has upcoming appointment with Cardiologist on 06/30/21

## 2021-06-25 NOTE — Telephone Encounter (Signed)
Pt released from the hosptial a few weeks ago. She was in for heart failure. Husband was just diagnosed with Covid. Pt needing advice on what to do .

## 2021-06-25 NOTE — Telephone Encounter (Signed)
Patient aware of message below and will follow recommendations.

## 2021-06-30 ENCOUNTER — Other Ambulatory Visit (HOSPITAL_COMMUNITY): Payer: Medicare Other

## 2021-07-14 ENCOUNTER — Ambulatory Visit (HOSPITAL_COMMUNITY)
Admission: RE | Admit: 2021-07-14 | Discharge: 2021-07-14 | Disposition: A | Discharge status: Home / Self Care | Payer: Medicare Other | Source: Ambulatory Visit | Attending: Cardiology | Admitting: Cardiology

## 2021-07-14 ENCOUNTER — Encounter (HOSPITAL_COMMUNITY): Payer: Self-pay

## 2021-07-14 ENCOUNTER — Other Ambulatory Visit: Payer: Self-pay

## 2021-07-14 VITALS — BP 160/90 | HR 73 | Wt 178.6 lb

## 2021-07-14 DIAGNOSIS — R0602 Shortness of breath: Secondary | ICD-10-CM | POA: Insufficient documentation

## 2021-07-14 DIAGNOSIS — I2510 Atherosclerotic heart disease of native coronary artery without angina pectoris: Secondary | ICD-10-CM | POA: Insufficient documentation

## 2021-07-14 DIAGNOSIS — F17210 Nicotine dependence, cigarettes, uncomplicated: Secondary | ICD-10-CM | POA: Insufficient documentation

## 2021-07-14 DIAGNOSIS — Z7902 Long term (current) use of antithrombotics/antiplatelets: Secondary | ICD-10-CM | POA: Insufficient documentation

## 2021-07-14 DIAGNOSIS — I252 Old myocardial infarction: Secondary | ICD-10-CM | POA: Diagnosis not present

## 2021-07-14 DIAGNOSIS — I5022 Chronic systolic (congestive) heart failure: Secondary | ICD-10-CM | POA: Insufficient documentation

## 2021-07-14 DIAGNOSIS — Z7901 Long term (current) use of anticoagulants: Secondary | ICD-10-CM | POA: Diagnosis not present

## 2021-07-14 DIAGNOSIS — Z79899 Other long term (current) drug therapy: Secondary | ICD-10-CM | POA: Insufficient documentation

## 2021-07-14 DIAGNOSIS — Z7982 Long term (current) use of aspirin: Secondary | ICD-10-CM | POA: Insufficient documentation

## 2021-07-14 DIAGNOSIS — K219 Gastro-esophageal reflux disease without esophagitis: Secondary | ICD-10-CM | POA: Diagnosis not present

## 2021-07-14 DIAGNOSIS — I110 Hypertensive heart disease with heart failure: Secondary | ICD-10-CM | POA: Insufficient documentation

## 2021-07-14 DIAGNOSIS — Z955 Presence of coronary angioplasty implant and graft: Secondary | ICD-10-CM | POA: Insufficient documentation

## 2021-07-14 LAB — BASIC METABOLIC PANEL
Anion gap: 10 (ref 5–15)
BUN: 17 mg/dL (ref 8–23)
CO2: 27 mmol/L (ref 22–32)
Calcium: 9.2 mg/dL (ref 8.9–10.3)
Chloride: 101 mmol/L (ref 98–111)
Creatinine, Ser: 1.12 mg/dL — ABNORMAL HIGH (ref 0.44–1.00)
GFR, Estimated: 52 mL/min — ABNORMAL LOW (ref >60)
Glucose, Bld: 89 mg/dL (ref 70–99)
Potassium: 3.9 mmol/L (ref 3.5–5.1)
Sodium: 138 mmol/L (ref 135–145)

## 2021-07-14 MED ORDER — NITROGLYCERIN 0.4 MG SL SUBL: 0.4000 mg | SUBLINGUAL_TABLET | SUBLINGUAL | 0 refills | Status: DC | PRN

## 2021-07-14 MED ORDER — SPIRONOLACTONE 25 MG PO TABS: 12.5000 mg | ORAL_TABLET | Freq: Every day | ORAL | 0 refills | Status: DC

## 2021-07-14 NOTE — Patient Instructions (Addendum)
Labs done today. We will contact you only if your labs are abnormal.  START Spironolactone 12.5mg  (1/2 tablet) by mouth daily.  INCREASE Pantoprazole(Protonix) to 40mg  (2 tablets) by mouth daily for 14 days THEN resume your normal dose of 20mg  (1 tablet) by mouth daily.  You were prescribed Nitroglycerin(Nitrostat) to take for chest pain as needed.     No other medication changes were made. Please continue all current medications as prescribed.  Your physician recommends that you schedule a follow-up appointment in: 1 week for a lab only appointment.  Please make sure you keep your pending appointment with Cardiology.

## 2021-07-14 NOTE — Progress Notes (Signed)
HEART IMPACT TRANSITIONS OF CARE    PCP: Libby Maw, MD Primary Cardiologist: Quay Burow, MD   HPI:  Referred to clinic by Dr. Harrell Gave for heart failure consultation.    76 y/o female w/ h/o systolic heart failure/ischemic CM, CAD, HTN, HLD, AI and tobacco abuse. Had anterior STEMI in 2016 while on vacation in [place]. Underwent PCI + DES to pLAD and RCA. EF was reportedly low but improved on subsequent echo, up to 40-45% on repeat 02/2015. Most recent echo 11/22 showed EF had normalized to 50-55%, w/ G2DD, mild-mod MR and mild to mod AI.    Recently admitted to [facility] 1/23 w/ dyspnea and found to be hypoxic requiring supp O2. BNP 451.4.  Serial troponin 39-->48-->71.  White blood cell count mildly elevated 11.9.  Chest x-ray showed a bibasilar atelectasis or infiltrate, no edema or substantial pleural effusion. PCT <0.10.  COVID and flu negative. Started on breathing tx and abx coverage w/ doxy for possible bronchitis as well as IV Lasix for suspected acute CHF component w/ improvement in symptoms and oxygenation.    Limited echo showed moderately reduced LVEF, 40-45%, though poor quality study. After diuresis w/ IV Lasix was transitioned to PO diuretics, lisinopril and coreg and referred to [facility] clinic.   At [facility] f/u 1/11, she noted improved symptoms w/o exertional dyspnea. Denied CP. Still smoking cigarettes. BP was moderately elevated. Wt and volume status was stable. Coreg was increased to 12.5 mg bid. We discussed transitioning from Lisinopril to Losartan in anticipation of eventually getting on Entresto but pt opted to wait until she finished her recently filled supply of lisinopril. She was referred back to cardiology to discuss ischemic testing given drop in EF.   The day following coreg titration, she had a fall at home and was seen in the ED. W/u was negative but it was suspected that fall was perhaps caused by low BP, though VS were stable and BP normotensive during ED  evaluation. BP was 131/61 and pulse rate was 65 bpm. Head CT was negative. She was advised to reduce Coreg back to 6.25 mg daily. At f/u with cardiology 1/13, spironolactone was added and outpatient stress test was recommended as part of ischemic w/u. This has not yet been completed but scheduled.    She presents back to [facility] clinic for f/u. Wt stable since last visit, unchanged at 178 lb. She reports she has not been taking spironolactone. She reports she was not told to start new medicine, though outlined in previous clinic note and listed on home med list. Her BP is elevated today 160/90. Reports stress at home. Her husband has been sick. She does ok w/ ALDs w/ some mild SOB if she over exerts herself but no resting dyspnea. She complains of recent CP but she feels due to acid reflux, post prandial and relieved w/ bleaching and TUMS. Takes Protonix 20 mg daily. Denies exertional CP.     Cardiac Testing  2D Echo 06/11/21 Poor quality images even with definity septuma and mid/basal inferior wall hypokinetic. Left ventricular ejection fraction, by estimation, is 40 to 45%. The left ventricle has mildly decreased function. The left ventricle demonstrates regional wall motion abnormalities (see scoring diagram/findings for description). The left ventricular internal cavity size was mildly dilated.    ROS: All systems negative except as listed in HPI, PMH and Problem List.  SH:  Social History   Socioeconomic History   Marital status: Married    Spouse name: [name]  [place]   Number of children: Not on file   Years of education: Not on file   Highest education level: Not on file  Occupational History   Occupation: retired  Tobacco Use   Smoking status: Every Day    Packs/day: 0.50    Years: 60.00    Pack years: 30.00    Types: Cigarettes   Smokeless tobacco: Never  Vaping Use   Vaping Use: Never used  Substance and Sexual Activity   Alcohol use: No   Drug use: No   Sexual activity: Yes     Birth control/protection: Surgical  Other Topics Concern   Not on file  Social History Narrative   Not on file   Social Determinants of Health   Financial Resource Strain: Low Risk    Difficulty of Paying Living Expenses: Not hard at all  Food Insecurity: No Food Insecurity   Worried About Charity fundraiser in the Last Year: Never true   [place] in the Last Year: Never true  Transportation Needs: No Transportation Needs   Lack of Transportation (Medical): No   Lack of Transportation (Non-Medical): No  Physical Activity: Inactive   Days of Exercise per Week: 0 days   Minutes of Exercise per Session: 0 min  Stress: No Stress Concern Present   Feeling of Stress : Only a little  Social Connections: Moderately Integrated   Frequency of Communication with Friends and Family: More than three times a week   Frequency of Social Gatherings with Friends and Family: More than three times a week   Attends Religious Services: 1 to 4 times per year   Active Member of [employer] or Organizations: No   Attends Music therapist: Never   Marital Status: Married  Human resources officer Violence: Not At Risk   Fear of Current or Ex-Partner: No   Emotionally Abused: No   Physically Abused: No   Sexually Abused: No    FH:  Family History  Problem Relation Age of Onset   Heart disease Mother    Heart failure Mother    Heart disease Father    Heart failure Father    CAD Other        both parents and multiple siblings with either died or had-related issues   Breast cancer Sister     Past Medical History:  Diagnosis Date   Aortic insufficiency    a. Mild AI by echo 2014.   Arthritis    CAD (coronary artery disease)    a. Anterior STEMI 01/2015 s/p overlapping DES x2 to prox LAD, DES to prox RCA in [facility].   Chronic back pain    stenosis/HNP   History of bronchitis 2014   History of colon polyps    Hyperlipidemia    Ischemic cardiomyopathy    Joint  pain    Joint swelling    Pneumonia 2014   Smokers' cough (HCC)    Stress headaches    Tobacco abuse     Current Outpatient Medications  Medication Sig Dispense Refill   albuterol (PROVENTIL HFA;VENTOLIN HFA) 108 (90 Base) MCG/ACT inhaler Inhale 1-2 puffs into the lungs every 6 (six) hours as needed for wheezing or shortness of breath. And or cough. 1 Inhaler 0   atorvastatin (LIPITOR) 80 MG tablet Take 1 tablet (80 mg total) by mouth daily. 90 tablet 3   carvedilol (COREG) 6.25 MG tablet Take 1 tablet (6.25 mg total) by mouth 2 (two) times daily  with a meal. TAKE 1 TABLET BY MOUTH TWICE DAILY 60 tablet 6   clopidogrel (PLAVIX) 75 MG tablet Take 1 tablet (75 mg total) by mouth daily. 90 tablet 3   [facility] ASPIRIN 81 MG EC tablet Take 81 mg by mouth daily.  0   furosemide (LASIX) 40 MG tablet Take 1 tablet (40 mg total) by mouth daily. 30 tablet 0   lisinopril (ZESTRIL) 40 MG tablet Take 1 tablet (40 mg total) by mouth daily. 30 tablet 6   pantoprazole (PROTONIX) 20 MG tablet Take 1 tablet (20 mg total) by mouth daily. 30 tablet 3   spironolactone (ALDACTONE) 25 MG tablet Take 1 tablet (25 mg total) by mouth daily. 90 tablet 3   No current facility-administered medications for this encounter.    Vitals:   07/14/21 1358  BP: (!) 160/90  Pulse: 73  SpO2: 100%  Weight: 81 kg (178 lb 9.6 oz)    PHYSICAL EXAM:  General:  Well appearing. No resp difficulty HEENT: normal Neck: supple. JVP not elevated Carotids 2+ bilaterally; no bruits. No lymphadenopathy or thryomegaly appreciated. Cor: PMI normal. Regular rate & rhythm. No rubs, gallops or murmurs. Lungs: clear Abdomen: soft, nontender, nondistended. No hepatosplenomegaly. No bruits or masses. Good bowel sounds. Extremities: no cyanosis, clubbing, rash, edema Neuro: alert & orientedx3, cranial nerves grossly intact. Moves all 4 extremities w/o difficulty. Affect pleasant.   ECG: Not performed    ASSESSMENT & PLAN:  Chronic  Systolic Heart Failure - h/o HFrEF in setting of IHD, EF 20% in 2016 in setting of obstructive pLAD, s/p PCI w/ subsequent normalization of EF on f/u echo. Echo 11/20 EF 50-55% - Most recent echo 1/23 EF down 40-44%. No ischemic like CP  - Cardiology planning NST to r/o coronary ischemia as potential etiology - NYHA Class II. Euvolemic on exam. BP moderately elevated  - Continue Coreg 6.25 mg bid. No further titration. Did not tolerate 12.5 mg dosing per above - Start Spironolactone 12.5 mg daily. BMP today and again in 7 days  - Continue Lisinopril 40 mg daily for now. Consider switch to losartan and eventual Entresto in the near future if BP remains stable. Doubt recent fall was 2/2 orthostasis but cannot completely rule this out.  - Continue Lasix 40 mg daily   2. Hypertension  - GDMT per above  - Adding spiro 12.5 mg today  - BMP today + 7 days   3. CAD:  - s/p PCI pLAD in 2016 - denies recent ischemic like CP, but with recent drop in EF plan is for NST to r/o ischemia. This has been arranged w/ Dr. Kennon Holter office - on ASA/Plavix, statin + ? blocker - will give Rx for PRN SL NTG    Continue further management of HF w/ cardiology. Follow up w/ Dr. Gwenlyn Found as planned. F/u arranged.

## 2021-07-16 ENCOUNTER — Ambulatory Visit: Payer: Medicare Other | Admitting: General Practice

## 2021-07-22 ENCOUNTER — Other Ambulatory Visit: Payer: Self-pay

## 2021-07-22 ENCOUNTER — Ambulatory Visit (HOSPITAL_COMMUNITY)
Admission: RE | Admit: 2021-07-22 | Discharge: 2021-07-22 | Disposition: A | Discharge status: Home / Self Care | Payer: Medicare Other | Source: Ambulatory Visit | Attending: Internal Medicine | Admitting: Internal Medicine

## 2021-07-22 DIAGNOSIS — I5022 Chronic systolic (congestive) heart failure: Secondary | ICD-10-CM | POA: Insufficient documentation

## 2021-07-22 LAB — BASIC METABOLIC PANEL
Anion gap: 10 (ref 5–15)
BUN: 16 mg/dL (ref 8–23)
CO2: 22 mmol/L (ref 22–32)
Calcium: 9.4 mg/dL (ref 8.9–10.3)
Chloride: 103 mmol/L (ref 98–111)
Creatinine, Ser: 0.97 mg/dL (ref 0.44–1.00)
GFR, Estimated: >60 mL/min (ref >60)
Glucose, Bld: 77 mg/dL (ref 70–99)
Potassium: 3.9 mmol/L (ref 3.5–5.1)
Sodium: 135 mmol/L (ref 135–145)

## 2021-07-27 ENCOUNTER — Telehealth: Payer: Self-pay | Admitting: Family Medicine

## 2021-07-27 NOTE — Telephone Encounter (Signed)
Caller Name: [name]  Call back phone #: [phone]  MEDICATION(S): valACYclovir (VALTREX) 1000 MG tablet [[id number]   Days of Med Remaining: none  Has the patient contacted their pharmacy?   ___ YES      IF YES, when and what did the pharmacy advise? Call lbgrandover IF NO, request that the patient contact the pharmacy for the refills in the future.             The pharmacy will send an electronic request (except for controlled medications).  Preferred Pharmacy: [facility] [address] [place]  [place], [place] [address]  Phone:  [phone]  Fax:  [phone]  DEA #:  [id number]  ~~~Please advise patient/caregiver to allow 2-3 business days to process RX refills.

## 2021-07-27 NOTE — Telephone Encounter (Signed)
Patient needs follow up appointment scheduled.

## 2021-07-28 ENCOUNTER — Other Ambulatory Visit: Payer: Self-pay

## 2021-07-29 ENCOUNTER — Ambulatory Visit (INDEPENDENT_AMBULATORY_CARE_PROVIDER_SITE_OTHER): Payer: Medicare Other | Admitting: Family Medicine

## 2021-07-29 ENCOUNTER — Encounter: Payer: Self-pay | Admitting: Family Medicine

## 2021-07-29 VITALS — BP 156/70 | HR 76 | Temp 97.8°F | Ht 62.0 in | Wt 177.0 lb

## 2021-07-29 DIAGNOSIS — I255 Ischemic cardiomyopathy: Secondary | ICD-10-CM

## 2021-07-29 DIAGNOSIS — J439 Emphysema, unspecified: Secondary | ICD-10-CM

## 2021-07-29 DIAGNOSIS — K219 Gastro-esophageal reflux disease without esophagitis: Secondary | ICD-10-CM | POA: Diagnosis not present

## 2021-07-29 DIAGNOSIS — I2510 Atherosclerotic heart disease of native coronary artery without angina pectoris: Secondary | ICD-10-CM | POA: Diagnosis not present

## 2021-07-29 DIAGNOSIS — B009 Herpesviral infection, unspecified: Secondary | ICD-10-CM

## 2021-07-29 DIAGNOSIS — Z720 Tobacco use: Secondary | ICD-10-CM | POA: Diagnosis not present

## 2021-07-29 MED ORDER — VALACYCLOVIR HCL 1 G PO TABS: 500.0000 mg | ORAL_TABLET | Freq: Two times a day (BID) | ORAL | 5 refills | Status: AC

## 2021-07-29 MED ORDER — PANTOPRAZOLE SODIUM 40 MG PO TBEC: 40.0000 mg | DELAYED_RELEASE_TABLET | Freq: Every day | ORAL | 3 refills | Status: DC

## 2021-07-29 MED ORDER — ALBUTEROL SULFATE HFA 108 (90 BASE) MCG/ACT IN AERS: 1.0000 | INHALATION_SPRAY | Freq: Four times a day (QID) | RESPIRATORY_TRACT | 0 refills | Status: DC | PRN

## 2021-07-29 NOTE — Progress Notes (Signed)
Established Patient Office Visit  Subjective:  Patient ID: Terri [name], female    DOB: 1946/01/19  Age: 76 y.o. MRN: [id number]  CC:  Chief Complaint  Patient presents with   Follow-up    Routine follow up, no concerns. Patient had breakfast around 9:30am.     HPI Terri [name] presents for follow-up of COPD with cough, GERD, cutaneous herpes, and elevated cholesterol.  Cardiac function has recently declined.  [place] perfusion scan planned for next week.  Reports burning in her chest without shortness of breath or nausea.  20 of Protonix originally relieved.  It does not seem to be working any longer.  Continues to smoke.  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  Request refill of albuterol inhaler.  Assures that she is not using it any more than a couple of times a week.  5-1/2 hours fasting.  Past Medical History:  Diagnosis Date   Aortic insufficiency    a. Mild AI by echo 2014.   Arthritis    CAD (coronary artery disease)    a. Anterior STEMI 01/2015 s/p overlapping DES x2 to prox LAD, DES to prox RCA in [facility].   Chronic back pain    stenosis/HNP   History of bronchitis 2014   History of colon polyps    Hyperlipidemia    Ischemic cardiomyopathy    Joint pain    Joint swelling    Pneumonia 2014   Smokers' cough ([place])    Stress headaches    Tobacco abuse     Past Surgical History:  Procedure Laterality Date   ABDOMINAL HYSTERECTOMY     at age 28   BREAST BIOPSY Right    roughly 30 years ago with [facility]    cataract surgery Bilateral    CHOLECYSTECTOMY     colonscopy     ERCP     ESOPHAGOGASTRODUODENOSCOPY     LUMBAR LAMINECTOMY Right 07/25/2014   Procedure: Right L4 Hemilaminectomy, Right L4-5 Laminotomy , Decompression, Microdisectomy;  Surgeon: Marybelle Killings, MD;  Location: [facility];  Service: Orthopedics;  Laterality: Right;   SHOULDER SURGERY Right    TONSILLECTOMY     as a child    Family History  Problem Relation Age of Onset   Heart  disease Mother    Heart failure Mother    Heart disease Father    Heart failure Father    CAD Other        both parents and multiple siblings with either died or had-related issues   Breast cancer Sister     Social History   Socioeconomic History   Marital status: Married    Spouse name: Shaunita Seney   Number of children: Not on file   Years of education: Not on file   Highest education level: Not on file  Occupational History   Occupation: retired  Tobacco Use   Smoking status: Every Day    Packs/day: 0.50    Years: 60.00    Pack years: 30.00    Types: Cigarettes   Smokeless tobacco: Never  Vaping Use   Vaping Use: Never used  Substance and Sexual Activity   Alcohol use: No   Drug use: No   Sexual activity: Yes    Birth control/protection: Surgical  Other Topics Concern   Not on file  Social History Narrative   Not on file   Social Determinants of Health   Financial Resource Strain: Low Risk    Difficulty of Paying  Living Expenses: Not hard at all  Food Insecurity: No Food Insecurity   Worried About [place] in the Last Year: Never true   Ran Out of Food in the Last Year: Never true  Transportation Needs: No Transportation Needs   Lack of Transportation (Medical): No   Lack of Transportation (Non-Medical): No  Physical Activity: Not on file  Stress: Not on file  Social Connections: Not on file  Intimate Partner Violence: Not on file    Outpatient Medications Prior to Visit  Medication Sig Dispense Refill   atorvastatin (LIPITOR) 80 MG tablet Take 1 tablet (80 mg total) by mouth daily. 90 tablet 3   carvedilol (COREG) 6.25 MG tablet Take 1 tablet (6.25 mg total) by mouth 2 (two) times daily with a meal. TAKE 1 TABLET BY MOUTH TWICE DAILY 60 tablet 6   clopidogrel (PLAVIX) 75 MG tablet Take 1 tablet (75 mg total) by mouth daily. 90 tablet 3   [facility] ASPIRIN 81 MG EC tablet Take 81 mg by mouth daily.  0   furosemide (LASIX) 40 MG tablet Take 1 tablet  (40 mg total) by mouth daily. 30 tablet 0   lisinopril (ZESTRIL) 40 MG tablet Take 1 tablet (40 mg total) by mouth daily. 30 tablet 6   nitroGLYCERIN (NITROSTAT) 0.4 MG SL tablet Place 1 tablet (0.4 mg total) under the tongue every 5 (five) minutes as needed for chest pain. 25 tablet 0   spironolactone (ALDACTONE) 25 MG tablet Take 0.5 tablets (12.5 mg total) by mouth daily. 15 tablet 0   albuterol (PROVENTIL HFA;VENTOLIN HFA) 108 (90 Base) MCG/ACT inhaler Inhale 1-2 puffs into the lungs every 6 (six) hours as needed for wheezing or shortness of breath. And or cough. 1 Inhaler 0   pantoprazole (PROTONIX) 20 MG tablet Take 1 tablet (20 mg total) by mouth daily. 30 tablet 3   valACYclovir (VALTREX) 1000 MG tablet Take by mouth.     No facility-administered medications prior to visit.    Allergies  Allergen Reactions   Erythromycin     nauseated    ROS Review of Systems  Constitutional:  Negative for chills, diaphoresis, fatigue, fever and unexpected weight change.  HENT: Negative.    Eyes:  Negative for photophobia and visual disturbance.  Respiratory:  Positive for cough and shortness of breath.   Cardiovascular:  Negative for chest pain and palpitations.  Gastrointestinal:  Negative for nausea and vomiting.  Genitourinary: Negative.   Neurological:  Negative for weakness.     Objective:    Physical Exam Vitals and nursing note reviewed.  Constitutional:      General: She is not in acute distress.    Appearance: Normal appearance. She is not ill-appearing, toxic-appearing or diaphoretic.  HENT:     Head: Normocephalic and atraumatic.     Right Ear: External ear normal.     Left Ear: External ear normal.     Mouth/Throat:     Mouth: Mucous membranes are moist.     Pharynx: Oropharynx is clear. No oropharyngeal exudate or posterior oropharyngeal erythema.  Eyes:     Extraocular Movements: Extraocular movements intact.     Conjunctiva/sclera: Conjunctivae normal.     Pupils:  Pupils are equal, round, and reactive to light.  Cardiovascular:     Rate and Rhythm: Normal rate and regular rhythm.  Pulmonary:     Effort: Pulmonary effort is normal.     Breath sounds: Examination of the left-upper field reveals wheezing. Examination of  the left-middle field reveals wheezing. Decreased breath sounds and wheezing present. No rhonchi or rales.  Abdominal:     General: Bowel sounds are normal.  Musculoskeletal:     Cervical back: No rigidity or tenderness.     Right lower leg: No swelling. No edema.     Left lower leg: No swelling. No edema.  Lymphadenopathy:     Cervical: No cervical adenopathy.  Skin:    General: Skin is warm and dry.  Neurological:     Mental Status: She is alert and oriented to person, place, and time.  Psychiatric:        Mood and Affect: Mood normal.        Behavior: Behavior normal.    BP (!) 156/70 (BP Location: Right Arm, Patient Position: Sitting, Cuff Size: Normal)    Pulse 76    Temp 97.8 F (36.6 C) (Temporal)    Ht 5' 2"  (1.575 m)    Wt 177 lb (80.3 kg)    SpO2 97%    BMI 32.37 kg/m  Wt Readings from Last 3 Encounters:  07/29/21 177 lb (80.3 kg)  07/14/21 178 lb 9.6 oz (81 kg)  06/18/21 174 lb 6.4 oz (79.1 kg)     Health Maintenance Due  Topic Date Due   Hepatitis C Screening  Never done    There are no preventive care reminders to display for this patient.  Lab Results  Component Value Date   TSH 1.402 06/11/2021   Lab Results  Component Value Date   WBC 12.4 (H) 06/17/2021   HGB 11.4 (L) 06/17/2021   HCT 34.9 (L) 06/17/2021   MCV 88.6 06/17/2021   PLT 284 06/17/2021   Lab Results  Component Value Date   NA 135 07/22/2021   K 3.9 07/22/2021   CO2 22 07/22/2021   GLUCOSE 77 07/22/2021   BUN 16 07/22/2021   CREATININE 0.97 07/22/2021   BILITOT 0.5 06/11/2021   ALKPHOS 112 06/11/2021   AST 11 (L) 06/11/2021   ALT 15 06/11/2021   PROT 6.5 06/11/2021   ALBUMIN 3.6 06/11/2021   CALCIUM 9.4 07/22/2021    ANIONGAP 10 07/22/2021   EGFR 76 04/14/2021   GFR 67.25 03/07/2018   Lab Results  Component Value Date   CHOL 133 08/14/2020   Lab Results  Component Value Date   HDL 41 08/14/2020   Lab Results  Component Value Date   LDLCALC 63 08/14/2020   Lab Results  Component Value Date   TRIG 170 (H) 08/14/2020   Lab Results  Component Value Date   CHOLHDL 3.2 08/14/2020   Lab Results  Component Value Date   HGBA1C 6.0 02/27/2015      Assessment & Plan:   Problem List Items Addressed This Visit       Cardiovascular and Mediastinum   Coronary artery disease involving native heart   Relevant Orders   Lipid panel   Hepatic function panel   Ischemic cardiomyopathy   Relevant Orders   Lipid panel   Hepatic function panel     Respiratory   Pulmonary emphysema (HCC)   Relevant Medications   albuterol (VENTOLIN HFA) 108 (90 Base) MCG/ACT inhaler     Digestive   Gastroesophageal reflux disease   Relevant Medications   pantoprazole (PROTONIX) 40 MG tablet     Other   Tobacco use - Primary   Cough   Relevant Medications   albuterol (VENTOLIN HFA) 108 (90 Base) MCG/ACT inhaler   Herpes virus disease  Relevant Medications   valACYclovir (VALTREX) 1000 MG tablet    Meds ordered this encounter  Medications   albuterol (VENTOLIN HFA) 108 (90 Base) MCG/ACT inhaler    Sig: Inhale 1-2 puffs into the lungs every 6 (six) hours as needed for wheezing or shortness of breath. And or cough.    Dispense:  1 each    Refill:  0   valACYclovir (VALTREX) 1000 MG tablet    Sig: Take 0.5 tablets (500 mg total) by mouth 2 (two) times daily for 10 days.    Dispense:  10 tablet    Refill:  5   pantoprazole (PROTONIX) 40 MG tablet    Sig: Take 1 tablet (40 mg total) by mouth daily.    Dispense:  30 tablet    Refill:  3    Follow-up: Return in about 3 months (around 10/26/2021), or Please stop smoking..  Have increased Protonix to 40 mg daily.  Advised that reflux can be an angina  equivalent do not hesitate to call 911 if there is associated nausea vomiting diaphoresis or increased shortness of breath.  Advised that using her inhaler more than a couple of times a week is not recommended.   Libby Maw, MD

## 2021-07-30 LAB — LIPID PANEL
Cholesterol: 112 mg/dL (ref 0–200)
HDL: 39.1 mg/dL (ref >39.00)
LDL Cholesterol: 44 mg/dL (ref 0–99)
NonHDL: 73.16
Total CHOL/HDL Ratio: 3
Triglycerides: 148 mg/dL (ref 0.0–149.0)
VLDL: 29.6 mg/dL (ref 0.0–40.0)

## 2021-07-30 LAB — HEPATIC FUNCTION PANEL
ALT: 14 U/L (ref 0–35)
AST: 12 U/L (ref 0–37)
Albumin: 4 g/dL (ref 3.5–5.2)
Alkaline Phosphatase: 105 U/L (ref 39–117)
Bilirubin, Direct: 0 mg/dL (ref 0.0–0.3)
Total Bilirubin: 0.4 mg/dL (ref 0.2–1.2)
Total Protein: 6.7 g/dL (ref 6.0–8.3)

## 2021-08-02 DIAGNOSIS — Z20822 Contact with and (suspected) exposure to covid-19: Secondary | ICD-10-CM | POA: Diagnosis not present

## 2021-08-06 ENCOUNTER — Telehealth (HOSPITAL_COMMUNITY): Payer: Self-pay | Admitting: *Deleted

## 2021-08-06 NOTE — Telephone Encounter (Signed)
Close encounter 

## 2021-08-10 ENCOUNTER — Other Ambulatory Visit: Payer: Self-pay

## 2021-08-10 ENCOUNTER — Ambulatory Visit (HOSPITAL_COMMUNITY)
Admission: RE | Admit: 2021-08-10 | Discharge: 2021-08-10 | Disposition: A | Discharge status: Home / Self Care | Payer: Medicare Other | Source: Ambulatory Visit | Attending: Cardiology | Admitting: Cardiology

## 2021-08-10 DIAGNOSIS — I255 Ischemic cardiomyopathy: Secondary | ICD-10-CM | POA: Diagnosis not present

## 2021-08-10 LAB — MYOCARDIAL PERFUSION IMAGING
LV dias vol: 210 mL (ref 46–106)
LV sys vol: 137 mL
Nuc Stress EF: 35 %
Peak HR: 90 {beats}/min
Rest HR: 72 {beats}/min
Rest Nuclear Isotope Dose: 10.4 mCi
SDS: 3
SRS: 10
SSS: 13
Stress Nuclear Isotope Dose: 31 mCi
TID: 1.02

## 2021-08-10 MED ORDER — REGADENOSON 0.4 MG/5ML IV SOLN
0.4000 mg | Freq: Once | INTRAVENOUS | Status: AC
  Administered 2021-08-10: 0.4 mg via INTRAVENOUS

## 2021-08-10 MED ORDER — TECHNETIUM TC 99M TETROFOSMIN IV KIT
10.4000 | PACK | Freq: Once | INTRAVENOUS | Status: AC | PRN
  Administered 2021-08-10: 10.4 via INTRAVENOUS
  Filled 2021-08-10: qty 11

## 2021-08-10 MED ORDER — AMINOPHYLLINE 25 MG/ML IV SOLN
100.0000 mg | Freq: Once | INTRAVENOUS | Status: AC
  Administered 2021-08-10: 100 mg via INTRAVENOUS

## 2021-08-10 MED ORDER — TECHNETIUM TC 99M TETROFOSMIN IV KIT
31.0000 | PACK | Freq: Once | INTRAVENOUS | Status: AC | PRN
  Administered 2021-08-10: 31 via INTRAVENOUS
  Filled 2021-08-10: qty 31

## 2021-08-11 ENCOUNTER — Ambulatory Visit: Payer: Medicare Other

## 2021-08-12 DIAGNOSIS — Z20822 Contact with and (suspected) exposure to covid-19: Secondary | ICD-10-CM | POA: Diagnosis not present

## 2021-08-12 DIAGNOSIS — R051 Acute cough: Secondary | ICD-10-CM | POA: Diagnosis not present

## 2021-08-12 DIAGNOSIS — R059 Cough, unspecified: Secondary | ICD-10-CM | POA: Diagnosis not present

## 2021-08-18 DIAGNOSIS — Z20822 Contact with and (suspected) exposure to covid-19: Secondary | ICD-10-CM | POA: Diagnosis not present

## 2021-08-23 ENCOUNTER — Telehealth: Payer: Self-pay | Admitting: Cardiovascular Disease

## 2021-08-23 NOTE — Telephone Encounter (Signed)
?*  STAT* If patient is at the pharmacy, call can be transferred to refill team. ? ? ?1. Which medications need to be refilled? (please list name of each medication and dose if known) Atorvastatin,Clopidogrel, Carvedilol, Furosemide, Lisinopril, Pantoprazole and Spironolactone ? ?2. Which pharmacy/location (including street and city if local pharmacy) is medication to be sent to? [facility] ? ?3. Do they need a 30 day or 90 day supply?  Her appointment with Dr Gwenlyn Found was cx for tomorrow(-21-23) ? ?

## 2021-08-24 ENCOUNTER — Ambulatory Visit: Payer: Medicare Other | Admitting: Cardiovascular Disease

## 2021-08-25 MED ORDER — ATORVASTATIN CALCIUM 80 MG PO TABS: 80.0000 mg | ORAL_TABLET | Freq: Every day | ORAL | 3 refills | Status: DC

## 2021-08-25 MED ORDER — FUROSEMIDE 40 MG PO TABS: 40.0000 mg | ORAL_TABLET | Freq: Every day | ORAL | 3 refills | Status: DC

## 2021-08-25 MED ORDER — CLOPIDOGREL BISULFATE 75 MG PO TABS: 75.0000 mg | ORAL_TABLET | Freq: Every day | ORAL | 3 refills | Status: DC

## 2021-08-25 NOTE — Telephone Encounter (Signed)
Atorvastatin, Clopidogrel, and Furosemide sent to the [facility]. The other medications requested were refilled already. ?

## 2021-08-26 DIAGNOSIS — R079 Chest pain, unspecified: Secondary | ICD-10-CM | POA: Diagnosis not present

## 2021-08-26 DIAGNOSIS — Z7902 Long term (current) use of antithrombotics/antiplatelets: Secondary | ICD-10-CM | POA: Diagnosis not present

## 2021-08-26 DIAGNOSIS — J439 Emphysema, unspecified: Secondary | ICD-10-CM | POA: Diagnosis present

## 2021-08-26 DIAGNOSIS — I509 Heart failure, unspecified: Secondary | ICD-10-CM | POA: Diagnosis not present

## 2021-08-26 DIAGNOSIS — I2510 Atherosclerotic heart disease of native coronary artery without angina pectoris: Secondary | ICD-10-CM | POA: Diagnosis not present

## 2021-08-26 DIAGNOSIS — I5022 Chronic systolic (congestive) heart failure: Secondary | ICD-10-CM | POA: Diagnosis present

## 2021-08-26 DIAGNOSIS — I25110 Atherosclerotic heart disease of native coronary artery with unstable angina pectoris: Secondary | ICD-10-CM | POA: Diagnosis not present

## 2021-08-26 DIAGNOSIS — E119 Type 2 diabetes mellitus without complications: Secondary | ICD-10-CM | POA: Diagnosis present

## 2021-08-26 DIAGNOSIS — Z720 Tobacco use: Secondary | ICD-10-CM | POA: Diagnosis not present

## 2021-08-26 DIAGNOSIS — R0600 Dyspnea, unspecified: Secondary | ICD-10-CM | POA: Diagnosis not present

## 2021-08-26 DIAGNOSIS — I110 Hypertensive heart disease with heart failure: Secondary | ICD-10-CM | POA: Diagnosis not present

## 2021-08-26 DIAGNOSIS — I252 Old myocardial infarction: Secondary | ICD-10-CM | POA: Diagnosis not present

## 2021-08-26 DIAGNOSIS — J449 Chronic obstructive pulmonary disease, unspecified: Secondary | ICD-10-CM | POA: Diagnosis not present

## 2021-08-26 DIAGNOSIS — Z7982 Long term (current) use of aspirin: Secondary | ICD-10-CM | POA: Diagnosis not present

## 2021-08-26 DIAGNOSIS — F17200 Nicotine dependence, unspecified, uncomplicated: Secondary | ICD-10-CM | POA: Diagnosis present

## 2021-08-26 DIAGNOSIS — R0602 Shortness of breath: Secondary | ICD-10-CM | POA: Diagnosis not present

## 2021-08-26 DIAGNOSIS — E669 Obesity, unspecified: Secondary | ICD-10-CM | POA: Diagnosis present

## 2021-08-26 DIAGNOSIS — Z90711 Acquired absence of uterus with remaining cervical stump: Secondary | ICD-10-CM | POA: Diagnosis not present

## 2021-08-26 DIAGNOSIS — R072 Precordial pain: Secondary | ICD-10-CM | POA: Diagnosis not present

## 2021-08-26 DIAGNOSIS — J40 Bronchitis, not specified as acute or chronic: Secondary | ICD-10-CM | POA: Diagnosis not present

## 2021-08-26 DIAGNOSIS — I340 Nonrheumatic mitral (valve) insufficiency: Secondary | ICD-10-CM | POA: Diagnosis present

## 2021-08-26 DIAGNOSIS — Z9049 Acquired absence of other specified parts of digestive tract: Secondary | ICD-10-CM | POA: Diagnosis not present

## 2021-08-26 DIAGNOSIS — E785 Hyperlipidemia, unspecified: Secondary | ICD-10-CM | POA: Diagnosis not present

## 2021-08-26 DIAGNOSIS — K219 Gastro-esophageal reflux disease without esophagitis: Secondary | ICD-10-CM | POA: Diagnosis present

## 2021-08-26 DIAGNOSIS — E7800 Pure hypercholesterolemia, unspecified: Secondary | ICD-10-CM | POA: Diagnosis present

## 2021-08-26 DIAGNOSIS — I214 Non-ST elevation (NSTEMI) myocardial infarction: Secondary | ICD-10-CM | POA: Diagnosis present

## 2021-08-26 DIAGNOSIS — Z7984 Long term (current) use of oral hypoglycemic drugs: Secondary | ICD-10-CM | POA: Diagnosis not present

## 2021-08-26 DIAGNOSIS — Z6832 Body mass index (BMI) 32.0-32.9, adult: Secondary | ICD-10-CM | POA: Diagnosis not present

## 2021-08-26 DIAGNOSIS — J9811 Atelectasis: Secondary | ICD-10-CM | POA: Diagnosis not present

## 2021-08-26 DIAGNOSIS — D649 Anemia, unspecified: Secondary | ICD-10-CM | POA: Diagnosis present

## 2021-08-27 ENCOUNTER — Telehealth: Payer: Self-pay | Admitting: Cardiovascular Disease

## 2021-08-27 DIAGNOSIS — E119 Type 2 diabetes mellitus without complications: Secondary | ICD-10-CM | POA: Diagnosis present

## 2021-08-27 DIAGNOSIS — J439 Emphysema, unspecified: Secondary | ICD-10-CM | POA: Diagnosis present

## 2021-08-27 DIAGNOSIS — E7800 Pure hypercholesterolemia, unspecified: Secondary | ICD-10-CM | POA: Diagnosis present

## 2021-08-27 DIAGNOSIS — Z7982 Long term (current) use of aspirin: Secondary | ICD-10-CM | POA: Diagnosis not present

## 2021-08-27 DIAGNOSIS — E785 Hyperlipidemia, unspecified: Secondary | ICD-10-CM | POA: Diagnosis not present

## 2021-08-27 DIAGNOSIS — I5022 Chronic systolic (congestive) heart failure: Secondary | ICD-10-CM | POA: Diagnosis present

## 2021-08-27 DIAGNOSIS — J9811 Atelectasis: Secondary | ICD-10-CM | POA: Diagnosis not present

## 2021-08-27 DIAGNOSIS — I509 Heart failure, unspecified: Secondary | ICD-10-CM | POA: Diagnosis not present

## 2021-08-27 DIAGNOSIS — I110 Hypertensive heart disease with heart failure: Secondary | ICD-10-CM | POA: Diagnosis present

## 2021-08-27 DIAGNOSIS — I252 Old myocardial infarction: Secondary | ICD-10-CM | POA: Diagnosis not present

## 2021-08-27 DIAGNOSIS — Z7984 Long term (current) use of oral hypoglycemic drugs: Secondary | ICD-10-CM | POA: Diagnosis not present

## 2021-08-27 DIAGNOSIS — E669 Obesity, unspecified: Secondary | ICD-10-CM | POA: Diagnosis present

## 2021-08-27 DIAGNOSIS — Z6832 Body mass index (BMI) 32.0-32.9, adult: Secondary | ICD-10-CM | POA: Diagnosis not present

## 2021-08-27 DIAGNOSIS — R0602 Shortness of breath: Secondary | ICD-10-CM | POA: Diagnosis not present

## 2021-08-27 DIAGNOSIS — I340 Nonrheumatic mitral (valve) insufficiency: Secondary | ICD-10-CM | POA: Diagnosis present

## 2021-08-27 DIAGNOSIS — Z90711 Acquired absence of uterus with remaining cervical stump: Secondary | ICD-10-CM | POA: Diagnosis not present

## 2021-08-27 DIAGNOSIS — F17200 Nicotine dependence, unspecified, uncomplicated: Secondary | ICD-10-CM | POA: Diagnosis present

## 2021-08-27 DIAGNOSIS — Z7902 Long term (current) use of antithrombotics/antiplatelets: Secondary | ICD-10-CM | POA: Diagnosis not present

## 2021-08-27 DIAGNOSIS — R079 Chest pain, unspecified: Secondary | ICD-10-CM | POA: Diagnosis not present

## 2021-08-27 DIAGNOSIS — I2510 Atherosclerotic heart disease of native coronary artery without angina pectoris: Secondary | ICD-10-CM | POA: Diagnosis present

## 2021-08-27 DIAGNOSIS — J449 Chronic obstructive pulmonary disease, unspecified: Secondary | ICD-10-CM | POA: Diagnosis not present

## 2021-08-27 DIAGNOSIS — Z9049 Acquired absence of other specified parts of digestive tract: Secondary | ICD-10-CM | POA: Diagnosis not present

## 2021-08-27 DIAGNOSIS — I25110 Atherosclerotic heart disease of native coronary artery with unstable angina pectoris: Secondary | ICD-10-CM | POA: Diagnosis not present

## 2021-08-27 DIAGNOSIS — I214 Non-ST elevation (NSTEMI) myocardial infarction: Secondary | ICD-10-CM | POA: Diagnosis present

## 2021-08-27 DIAGNOSIS — D649 Anemia, unspecified: Secondary | ICD-10-CM | POA: Diagnosis present

## 2021-08-27 DIAGNOSIS — Z720 Tobacco use: Secondary | ICD-10-CM | POA: Diagnosis not present

## 2021-08-27 DIAGNOSIS — K219 Gastro-esophageal reflux disease without esophagitis: Secondary | ICD-10-CM | POA: Diagnosis present

## 2021-08-27 NOTE — Telephone Encounter (Signed)
-  Pt husband call to make Dr. Gwenlyn Found and Denyse Amass aware that pt is currently admitted to [facility] in [place]. ?-He state pt suffered a MI yesterday evening and is currently having an cath.  ?-Once discharged, husband will have pt's hospitalization records faxed for review.  ? ?Nurse made husband aware we will keep him and pt in our thoughts.  ?

## 2021-08-27 NOTE — Telephone Encounter (Signed)
Patient has had a heart attach and is in the hospital. Husband with like to speak with Dr. Gwenlyn Found or Denyse Amass bout it. Please advise  ?

## 2021-08-30 NOTE — Telephone Encounter (Signed)
Pt updated and verbalized understanding.  ? ?Lorretta Harp, MD  You 2 days ago  ? ?Sorry to hear that. Happy to see her back in F/U after she's discharged   ? ?

## 2021-08-31 ENCOUNTER — Encounter: Payer: Self-pay | Admitting: Family Medicine

## 2021-08-31 ENCOUNTER — Ambulatory Visit (INDEPENDENT_AMBULATORY_CARE_PROVIDER_SITE_OTHER): Payer: Medicare Other | Admitting: Family Medicine

## 2021-08-31 ENCOUNTER — Other Ambulatory Visit: Payer: Self-pay

## 2021-08-31 VITALS — BP 116/68 | HR 76 | Temp 96.9°F | Ht 62.0 in | Wt 178.4 lb

## 2021-08-31 DIAGNOSIS — I255 Ischemic cardiomyopathy: Secondary | ICD-10-CM | POA: Diagnosis not present

## 2021-08-31 DIAGNOSIS — L720 Epidermal cyst: Secondary | ICD-10-CM | POA: Diagnosis not present

## 2021-08-31 DIAGNOSIS — I25720 Atherosclerosis of autologous artery coronary artery bypass graft(s) with unstable angina pectoris: Secondary | ICD-10-CM

## 2021-08-31 DIAGNOSIS — I2510 Atherosclerotic heart disease of native coronary artery without angina pectoris: Secondary | ICD-10-CM

## 2021-08-31 MED ORDER — DOXYCYCLINE HYCLATE 100 MG PO TABS: 100.0000 mg | ORAL_TABLET | Freq: Two times a day (BID) | ORAL | 0 refills | Status: AC

## 2021-08-31 MED ORDER — CARVEDILOL 6.25 MG PO TABS: 6.2500 mg | ORAL_TABLET | Freq: Two times a day (BID) | ORAL | 6 refills | Status: DC

## 2021-08-31 NOTE — Telephone Encounter (Signed)
LM2CB AT HM NUMBER ?Called cell "your can not be completed at this time" will have to try again later.  ?I do not see that pt is in [place] or [place]. We will need to get notes from hosp that is performing CATH. ?

## 2021-08-31 NOTE — Progress Notes (Signed)
? ?Established Patient Office Visit ? ?Subjective:  ?Patient ID: Terri [name], female    DOB: 02/23/1946  Age: 76 y.o. MRN: [id number] ? ?CC:  ?Chief Complaint  ?Patient presents with  ? Hospitalization Follow-up  ?  Hospital follow seen at hospital at [facility] seen for heart attack last Thursday. Per patient feeling better.   ? ? ?HPI ?Terri [name] presents for hospital discharge follow-up.  Reports that she was treated for an MI this past week in [facility].  Drug-eluting stents were placed in the RCA and circumflex arteries.  It sounds as though she suffered a cardiac arrest at some point after the procedure and was resuscitated.  She is currently doing well.  She has had no further chest pain and is back to her baseline level of dyspnea.  Continues to smoke with a history of COPD and coronary artery disc disease.  History of ischemic cardiomyopathy.  She is frustrated with her current cardiologist because refills were not sent to pharmacy. ? ?Past Medical History:  ?Diagnosis Date  ? Aortic insufficiency   ? a. Mild AI by echo 2014.  ? Arthritis   ? CAD (coronary artery disease)   ? a. Anterior STEMI 01/2015 s/p overlapping DES x2 to prox LAD, DES to prox RCA in [facility].  ? Chronic back pain   ? stenosis/HNP  ? History of bronchitis 2014  ? History of colon polyps   ? Hyperlipidemia   ? Ischemic cardiomyopathy   ? Joint pain   ? Joint swelling   ? Pneumonia 2014  ? Smokers' cough ([place])   ? Stress headaches   ? Tobacco abuse   ? ? ?Past Surgical History:  ?Procedure Laterality Date  ? ABDOMINAL HYSTERECTOMY    ? at age 50  ? BREAST BIOPSY Right   ? roughly 30 years ago with [facility]   ? cataract surgery Bilateral   ? CHOLECYSTECTOMY    ? colonscopy    ? ERCP    ? ESOPHAGOGASTRODUODENOSCOPY    ? LUMBAR LAMINECTOMY Right 07/25/2014  ? Procedure: Right L4 Hemilaminectomy, Right L4-5 Laminotomy , Decompression, Microdisectomy;  Surgeon: Marybelle Killings, MD;  Location: [place];  Service: Orthopedics;   Laterality: Right;  ? SHOULDER SURGERY Right   ? TONSILLECTOMY    ? as a child  ? ? ?Family History  ?Problem Relation Age of Onset  ? Heart disease Mother   ? Heart failure Mother   ? Heart disease Father   ? Heart failure Father   ? CAD Other   ?     both parents and multiple siblings with either died or had-related issues  ? Breast cancer Sister   ? ? ?Social History  ? ?Socioeconomic History  ? Marital status: Married  ?  Spouse name: Mikaiya Tramble  ? Number of children: Not on file  ? Years of education: Not on file  ? Highest education level: Not on file  ?Occupational History  ? Occupation: retired  ?Tobacco Use  ? Smoking status: Every Day  ?  Packs/day: 0.50  ?  Years: 60.00  ?  Pack years: 30.00  ?  Types: Cigarettes  ? Smokeless tobacco: Never  ?Vaping Use  ? Vaping Use: Never used  ?Substance and Sexual Activity  ? Alcohol use: No  ? Drug use: No  ? Sexual activity: Yes  ?  Birth control/protection: Surgical  ?Other Topics Concern  ? Not on file  ?Social History Narrative  ?  Not on file  ? ?Social Determinants of Health  ? ?Financial Resource Strain: Low Risk   ? Difficulty of Paying Living Expenses: Not hard at all  ?Food Insecurity: No Food Insecurity  ? Worried About Charity fundraiser in the Last Year: Never true  ? Ran Out of Food in the Last Year: Never true  ?Transportation Needs: No Transportation Needs  ? Lack of Transportation (Medical): No  ? Lack of Transportation (Non-Medical): No  ?Physical Activity: Not on file  ?Stress: Not on file  ?Social Connections: Not on file  ?Intimate Partner Violence: Not on file  ? ? ?Outpatient Medications Prior to Visit  ?Medication Sig Dispense Refill  ? albuterol (VENTOLIN HFA) 108 (90 Base) MCG/ACT inhaler Inhale 1-2 puffs into the lungs every 6 (six) hours as needed for wheezing or shortness of breath. And or cough. 1 each 0  ? atorvastatin (LIPITOR) 80 MG tablet Take 1 tablet (80 mg total) by mouth daily. 90 tablet 3  ? clopidogrel (PLAVIX) 75 MG tablet  Take 1 tablet (75 mg total) by mouth daily. 90 tablet 3  ? [employer] ASPIRIN 81 MG EC tablet Take 81 mg by mouth daily.  0  ? furosemide (LASIX) 40 MG tablet Take 1 tablet (40 mg total) by mouth daily. 90 tablet 3  ? lisinopril (ZESTRIL) 40 MG tablet Take 1 tablet (40 mg total) by mouth daily. 30 tablet 6  ? nitroGLYCERIN (NITROSTAT) 0.4 MG SL tablet Place 1 tablet (0.4 mg total) under the tongue every 5 (five) minutes as needed for chest pain. 25 tablet 0  ? spironolactone (ALDACTONE) 25 MG tablet Take 0.5 tablets (12.5 mg total) by mouth daily. 15 tablet 0  ? carvedilol (COREG) 6.25 MG tablet Take 1 tablet (6.25 mg total) by mouth 2 (two) times daily with a meal. TAKE 1 TABLET BY MOUTH TWICE DAILY 60 tablet 6  ? pantoprazole (PROTONIX) 40 MG tablet Take 1 tablet (40 mg total) by mouth daily. (Patient not taking: Reported on 08/31/2021) 30 tablet 3  ? ?No facility-administered medications prior to visit.  ? ? ?Allergies  ?Allergen Reactions  ? Erythromycin   ?  nauseated  ? ? ?ROS ?Review of Systems  ?Constitutional:  Negative for diaphoresis, fatigue, fever and unexpected weight change.  ?HENT: Negative.    ?Eyes:  Negative for photophobia and visual disturbance.  ?Respiratory:  Positive for shortness of breath. Negative for chest tightness and wheezing.   ?Cardiovascular:  Negative for chest pain, palpitations and leg swelling.  ?Gastrointestinal: Negative.   ?Genitourinary: Negative.   ? ?  ?Objective:  ?  ?Physical Exam ?Vitals and nursing note reviewed.  ?Constitutional:   ?   General: She is not in acute distress. ?   Appearance: Normal appearance. She is not ill-appearing, toxic-appearing or diaphoretic.  ?HENT:  ?   Head: Normocephalic and atraumatic.  ?   Right Ear: External ear normal.  ?   Left Ear: External ear normal.  ?Eyes:  ?   General: No scleral icterus.    ?   Right eye: No discharge.     ?   Left eye: No discharge.  ?   Extraocular Movements: Extraocular movements intact.  ?   Conjunctiva/sclera:  Conjunctivae normal.  ?   Pupils: Pupils are equal, round, and reactive to light.  ?Cardiovascular:  ?   Rate and Rhythm: Normal rate and regular rhythm.  ?   Heart sounds: Murmur heard.  ?Pulmonary:  ?   Breath sounds:  Decreased air movement present. No rhonchi or rales.  ?Abdominal:  ?   General: Bowel sounds are normal.  ?Musculoskeletal:  ?     Legs: ? ?Skin: ?   General: Skin is warm and dry.  ? ?    ?Neurological:  ?   Mental Status: She is alert and oriented to person, place, and time.  ?Psychiatric:     ?   Mood and Affect: Mood normal.     ?   Behavior: Behavior normal.  ? ? ?BP 116/68 (BP Location: Left Arm, Patient Position: Sitting, Cuff Size: Normal)   Pulse 76   Temp (!) 96.9 ?F (36.1 ?C) (Temporal)   Ht _0  (1.575 m)   Wt 178 lb 6.4 oz (80.9 kg)   SpO2 97%   BMI 32.63 kg/m?  ?Wt Readings from Last 3 Encounters:  ?08/31/21 178 lb 6.4 oz (80.9 kg)  ?08/10/21 178 lb (80.7 kg)  ?07/29/21 177 lb (80.3 kg)  ? ? ? ?Health Maintenance Due  ?Topic Date Due  ? Hepatitis C Screening  Never done  ? ? ?There are no preventive care reminders to display for this patient. ? ?Lab Results  ?Component Value Date  ? TSH 1.402 06/11/2021  ? ?Lab Results  ?Component Value Date  ? WBC 12.4 (H) 06/17/2021  ? HGB 11.4 (L) 06/17/2021  ? HCT 34.9 (L) 06/17/2021  ? MCV 88.6 06/17/2021  ? PLT 284 06/17/2021  ? ?Lab Results  ?Component Value Date  ? NA 135 07/22/2021  ? K 3.9 07/22/2021  ? CO2 22 07/22/2021  ? GLUCOSE 77 07/22/2021  ? BUN 16 07/22/2021  ? CREATININE 0.97 07/22/2021  ? BILITOT 0.4 07/29/2021  ? ALKPHOS 105 07/29/2021  ? AST 12 07/29/2021  ? ALT 14 07/29/2021  ? PROT 6.7 07/29/2021  ? ALBUMIN 4.0 07/29/2021  ? CALCIUM 9.4 07/22/2021  ? ANIONGAP 10 07/22/2021  ? EGFR 76 04/14/2021  ? GFR 67.25 03/07/2018  ? ?Lab Results  ?Component Value Date  ? CHOL 112 07/29/2021  ? ?Lab Results  ?Component Value Date  ? HDL 39.10 07/29/2021  ? ?Lab Results  ?Component Value Date  ? LDLCALC 44 07/29/2021  ? ?Lab Results   ?Component Value Date  ? TRIG 148.0 07/29/2021  ? ?Lab Results  ?Component Value Date  ? CHOLHDL 3 07/29/2021  ? ?Lab Results  ?Component Value Date  ? HGBA1C 6.0 02/27/2015  ? ? ?  ?Assessment & Plan:  ? ?Problem

## 2021-09-01 ENCOUNTER — Telehealth: Payer: Self-pay | Admitting: Cardiovascular Disease

## 2021-09-01 NOTE — Telephone Encounter (Signed)
Patient requesting to switch from Dr. Gwenlyn Found to Dr. Tamala Julian, please advise ?

## 2021-09-03 ENCOUNTER — Telehealth: Payer: Self-pay | Admitting: Family Medicine

## 2021-09-03 DIAGNOSIS — Z20822 Contact with and (suspected) exposure to covid-19: Secondary | ICD-10-CM | POA: Diagnosis not present

## 2021-09-03 NOTE — Telephone Encounter (Signed)
Pt's husband(Raymond) dropped off paperwork for his wife that Dr. Ethelene Hal needs to fill out. I have placed In his folder upfront. Please call [name] when complete at [phone] and he will come and pick up.  ?

## 2021-09-06 ENCOUNTER — Telehealth: Payer: Self-pay | Admitting: Internal Medicine

## 2021-09-06 NOTE — Telephone Encounter (Signed)
Forms received pending, on Providers desk to be viewed and filled out.  ?

## 2021-09-06 NOTE — Telephone Encounter (Signed)
New Message: ? ? ? ? ? ?Patient would like to switch from Dr Gwenlyn Found to you? Is this alright with you? ?

## 2021-09-07 DIAGNOSIS — Z20822 Contact with and (suspected) exposure to covid-19: Secondary | ICD-10-CM | POA: Diagnosis not present

## 2021-09-08 NOTE — Telephone Encounter (Signed)
Forms complete patient husband aware and will pick up.  ?

## 2021-09-09 DIAGNOSIS — Z20822 Contact with and (suspected) exposure to covid-19: Secondary | ICD-10-CM | POA: Diagnosis not present

## 2021-09-13 ENCOUNTER — Telehealth: Payer: Self-pay | Admitting: Family Medicine

## 2021-09-13 NOTE — Telephone Encounter (Signed)
Pt states cyst has not gotten any better after antibiotics and warm compress.  ?

## 2021-09-13 NOTE — Telephone Encounter (Signed)
Appointment scheduled for follow up 

## 2021-09-14 ENCOUNTER — Ambulatory Visit (INDEPENDENT_AMBULATORY_CARE_PROVIDER_SITE_OTHER): Payer: Medicare Other | Admitting: Family Medicine

## 2021-09-14 ENCOUNTER — Encounter: Payer: Self-pay | Admitting: Family Medicine

## 2021-09-14 VITALS — BP 124/70 | HR 70 | Temp 97.2°F | Ht 62.0 in | Wt 173.6 lb

## 2021-09-14 DIAGNOSIS — L720 Epidermal cyst: Secondary | ICD-10-CM

## 2021-09-14 NOTE — Progress Notes (Signed)
? ?Established Patient Office Visit ? ?Subjective:  ?Patient ID: Terri [name], female    DOB: 1946-01-07  Age: 76 y.o. MRN: [id number] ? ?CC:  ?Chief Complaint  ?Patient presents with  ? Cyst  ?  Cyst at left armpit have not improved after antibiotic and warm compressions.   ? ? ?HPI ?Terri [name] presents for cyst in left axilla persist after treatment with Doxy and warm compresses.  It has not drained.  Cardiology follow-up is on Friday. ? ?Past Medical History:  ?Diagnosis Date  ? Aortic insufficiency   ? a. Mild AI by echo 2014.  ? Arthritis   ? CAD (coronary artery disease)   ? a. Anterior STEMI 01/2015 s/p overlapping DES x2 to prox LAD, DES to prox RCA in [facility].  ? Chronic back pain   ? stenosis/HNP  ? History of bronchitis 2014  ? History of colon polyps   ? Hyperlipidemia   ? Ischemic cardiomyopathy   ? Joint pain   ? Joint swelling   ? Pneumonia 2014  ? Smokers' cough ([place])   ? Stress headaches   ? Tobacco abuse   ? ? ?Past Surgical History:  ?Procedure Laterality Date  ? ABDOMINAL HYSTERECTOMY    ? at age 82  ? BREAST BIOPSY Right   ? roughly 30 years ago with [facility]   ? cataract surgery Bilateral   ? CHOLECYSTECTOMY    ? colonscopy    ? ERCP    ? ESOPHAGOGASTRODUODENOSCOPY    ? LUMBAR LAMINECTOMY Right 07/25/2014  ? Procedure: Right L4 Hemilaminectomy, Right L4-5 Laminotomy , Decompression, Microdisectomy;  Surgeon: Marybelle Killings, MD;  Location: [place];  Service: Orthopedics;  Laterality: Right;  ? SHOULDER SURGERY Right   ? TONSILLECTOMY    ? as a child  ? ? ?Family History  ?Problem Relation Age of Onset  ? Heart disease Mother   ? Heart failure Mother   ? Heart disease Father   ? Heart failure Father   ? CAD Other   ?     both parents and multiple siblings with either died or had-related issues  ? Breast cancer Sister   ? ? ?Social History  ? ?Socioeconomic History  ? Marital status: Married  ?  Spouse name: Harlo Jaso  ? Number of children: Not on file  ? Years of education: Not on  file  ? Highest education level: Not on file  ?Occupational History  ? Occupation: retired  ?Tobacco Use  ? Smoking status: Every Day  ?  Packs/day: 0.50  ?  Years: 60.00  ?  Pack years: 30.00  ?  Types: Cigarettes  ? Smokeless tobacco: Never  ?Vaping Use  ? Vaping Use: Never used  ?Substance and Sexual Activity  ? Alcohol use: No  ? Drug use: No  ? Sexual activity: Yes  ?  Birth control/protection: Surgical  ?Other Topics Concern  ? Not on file  ?Social History Narrative  ? Not on file  ? ?Social Determinants of Health  ? ?Financial Resource Strain: Low Risk   ? Difficulty of Paying Living Expenses: Not hard at all  ?Food Insecurity: No Food Insecurity  ? Worried About Charity fundraiser in the Last Year: Never true  ? Ran Out of Food in the Last Year: Never true  ?Transportation Needs: No Transportation Needs  ? Lack of Transportation (Medical): No  ? Lack of Transportation (Non-Medical): No  ?Physical Activity: Not on file  ?Stress:  Not on file  ?Social Connections: Not on file  ?Intimate Partner Violence: Not on file  ? ? ?Outpatient Medications Prior to Visit  ?Medication Sig Dispense Refill  ? albuterol (VENTOLIN HFA) 108 (90 Base) MCG/ACT inhaler Inhale 1-2 puffs into the lungs every 6 (six) hours as needed for wheezing or shortness of breath. And or cough. 1 each 0  ? atorvastatin (LIPITOR) 80 MG tablet Take 1 tablet (80 mg total) by mouth daily. 90 tablet 3  ? carvedilol (COREG) 6.25 MG tablet Take 1 tablet (6.25 mg total) by mouth 2 (two) times daily with a meal. TAKE 1 TABLET BY MOUTH TWICE DAILY 60 tablet 6  ? clopidogrel (PLAVIX) 75 MG tablet Take 1 tablet (75 mg total) by mouth daily. 90 tablet 3  ? [employer]S ASPIRIN 81 MG EC tablet Take 81 mg by mouth daily.  0  ? furosemide (LASIX) 40 MG tablet Take 1 tablet (40 mg total) by mouth daily. 90 tablet 3  ? lisinopril (ZESTRIL) 40 MG tablet Take 1 tablet (40 mg total) by mouth daily. 30 tablet 6  ? nitroGLYCERIN (NITROSTAT) 0.4 MG SL tablet Place 1 tablet (0.4  mg total) under the tongue every 5 (five) minutes as needed for chest pain. 25 tablet 0  ? pantoprazole (PROTONIX) 40 MG tablet Take 1 tablet (40 mg total) by mouth daily. 30 tablet 3  ? spironolactone (ALDACTONE) 25 MG tablet Take 0.5 tablets (12.5 mg total) by mouth daily. 15 tablet 0  ? ?No facility-administered medications prior to visit.  ? ? ?Allergies  ?Allergen Reactions  ? Erythromycin   ?  nauseated  ? ? ?ROS ?Review of Systems  ?Constitutional:  Negative for chills, diaphoresis, fatigue, fever and unexpected weight change.  ?Respiratory: Negative.    ?Cardiovascular: Negative.   ?Gastrointestinal: Negative.   ?Skin:  Positive for color change. Negative for wound.  ?Neurological:  Negative for speech difficulty, weakness and light-headedness.  ?Psychiatric/Behavioral:  Negative for agitation and behavioral problems.   ? ?  ?Objective:  ?  ?Physical Exam ?Vitals and nursing note reviewed.  ?Constitutional:   ?   Appearance: Normal appearance.  ?HENT:  ?   Head: Normocephalic and atraumatic.  ?Eyes:  ?   Extraocular Movements: Extraocular movements intact.  ?   Conjunctiva/sclera: Conjunctivae normal.  ?Pulmonary:  ?   Effort: Pulmonary effort is normal.  ?Skin: ?   General: Skin is warm and dry.  ? ?    ?Neurological:  ?   Mental Status: She is alert and oriented to person, place, and time.  ?Psychiatric:     ?   Mood and Affect: Mood normal.     ?   Behavior: Behavior normal.  ? ?BP 124/70 (BP Location: Left Arm, Patient Position: Sitting, Cuff Size: Normal)   Pulse 70   Temp (!) 97.2 ?F (36.2 ?C) (Temporal)   Ht _0  (1.575 m)   Wt 173 lb 9.6 oz (78.7 kg)   SpO2 97%   BMI 31.75 kg/m?  ?Wt Readings from Last 3 Encounters:  ?09/14/21 173 lb 9.6 oz (78.7 kg)  ?08/31/21 178 lb 6.4 oz (80.9 kg)  ?08/10/21 178 lb (80.7 kg)  ? ?Incision and Drainage Procedure Note ? ?Pre-operative Diagnosis: inclusion cyst ? ?Post-operative Diagnosis: normal ? ?Indications: persisting after abx therapy ? ?Anesthesia: 2%  plain lidocaine x 1 cc ? ?Procedure Details  ?The procedure, risks and complications have been discussed in detail (including, but not limited to airway compromise, infection, bleeding) with the  patient, and the patient has signed consent to the procedure. ? ?The skin was sterilely prepped and draped over the affected area in the usual fashion. ?After adequate local anesthesia, I&D with a #11 blade was performed on the left axilla. Purulent drainage: absent ?The patient was observed until stable. ? ?Findings: ?Cyst removed intact and in total ? ?EBL: 0 cc's ? ?Drains: none ? ?Condition: Tolerated procedure well ? ? ?Complications: ?none. ? ? ? ?Health Maintenance Due  ?Topic Date Due  ? Hepatitis C Screening  Never done  ? ? ?There are no preventive care reminders to display for this patient. ? ?Lab Results  ?Component Value Date  ? TSH 1.402 06/11/2021  ? ?Lab Results  ?Component Value Date  ? WBC 12.4 (H) 06/17/2021  ? HGB 11.4 (L) 06/17/2021  ? HCT 34.9 (L) 06/17/2021  ? MCV 88.6 06/17/2021  ? PLT 284 06/17/2021  ? ?Lab Results  ?Component Value Date  ? NA 135 07/22/2021  ? K 3.9 07/22/2021  ? CO2 22 07/22/2021  ? GLUCOSE 77 07/22/2021  ? BUN 16 07/22/2021  ? CREATININE 0.97 07/22/2021  ? BILITOT 0.4 07/29/2021  ? ALKPHOS 105 07/29/2021  ? AST 12 07/29/2021  ? ALT 14 07/29/2021  ? PROT 6.7 07/29/2021  ? ALBUMIN 4.0 07/29/2021  ? CALCIUM 9.4 07/22/2021  ? ANIONGAP 10 07/22/2021  ? EGFR 76 04/14/2021  ? GFR 67.25 03/07/2018  ? ?Lab Results  ?Component Value Date  ? CHOL 112 07/29/2021  ? ?Lab Results  ?Component Value Date  ? HDL 39.10 07/29/2021  ? ?Lab Results  ?Component Value Date  ? LDLCALC 44 07/29/2021  ? ?Lab Results  ?Component Value Date  ? TRIG 148.0 07/29/2021  ? ?Lab Results  ?Component Value Date  ? CHOLHDL 3 07/29/2021  ? ?Lab Results  ?Component Value Date  ? HGBA1C 6.0 02/27/2015  ? ? ?  ?Assessment & Plan:  ? ?Problem List Items Addressed This Visit   ? ?  ? Other  ? Inclusion cyst - Primary   ? ? ?No orders of the defined types were placed in this encounter. ? ? ?Follow-up: No follow-ups on file.  ?Apply triple antibiotic cream twice daily and keep covered for 1 week.  Return if not healed in a wee

## 2021-09-17 ENCOUNTER — Ambulatory Visit (INDEPENDENT_AMBULATORY_CARE_PROVIDER_SITE_OTHER): Payer: Medicare Other | Admitting: Internal Medicine

## 2021-09-17 ENCOUNTER — Encounter: Payer: Self-pay | Admitting: Internal Medicine

## 2021-09-17 VITALS — BP 116/70 | HR 69 | Ht 62.5 in | Wt 176.6 lb

## 2021-09-17 DIAGNOSIS — I5022 Chronic systolic (congestive) heart failure: Secondary | ICD-10-CM | POA: Diagnosis not present

## 2021-09-17 DIAGNOSIS — I255 Ischemic cardiomyopathy: Secondary | ICD-10-CM

## 2021-09-17 NOTE — Patient Instructions (Signed)
Medication Instructions:  ?The current medical regimen is effective;  continue present plan and medications. ? ?*If you need a refill on your cardiac medications before your next appointment, please call your pharmacy* ? ? ?Follow-Up: ?At [facility], you and your health needs are our priority.  As part of our continuing mission to provide you with exceptional heart care, we have created designated Provider Care Teams.  These Care Teams include your primary Cardiologist (physician) and Advanced Practice Providers (APPs -  Physician Assistants and Nurse Practitioners) who all work together to provide you with the care you need, when you need it. ? ?We recommend signing up for the patient portal called "MyChart".  Sign up information is provided on this After Visit Summary.  MyChart is used to connect with patients for Virtual Visits (Telemedicine).  Patients are able to view lab/test results, encounter notes, upcoming appointments, etc.  Non-urgent messages can be sent to your provider as well.   ?To learn more about what you can do with MyChart, go to [email or web address].   ? ?Your next appointment:   ?3 month(s) ? ?The format for your next appointment:   ?In Person ? ?Provider:   ?Phineas Inches, MD  ? ? ?Important Information About Sugar ? ? ? ? ? ? ?

## 2021-09-17 NOTE — Progress Notes (Deleted)
?Cardiology Office Note:   ? ?Date:  09/17/2021  ? ?ID:  Terri [name], DOB May 18, 1946, MRN [id number] ? ?PCP:  Libby Maw, MD ?  ?[facility] Providers ?Cardiologist:  Quay Burow, MD { ?Click to update primary MD,subspecialty MD or APP then REFRESH:1}   ? ?Referring MD: Libby Maw,*  ? ?No chief complaint on file. ?*** ? ?History of Present Illness:   ? ?Terri [name] is a 76 y.o. female with a hx of *** ? ?Past Medical History:  ?Diagnosis Date  ? Aortic insufficiency   ? a. Mild AI by echo 2014.  ? Arthritis   ? CAD (coronary artery disease)   ? a. Anterior STEMI 01/2015 s/p overlapping DES x2 to prox LAD, DES to prox RCA in [facility].  ? Chronic back pain   ? stenosis/HNP  ? History of bronchitis 2014  ? History of colon polyps   ? Hyperlipidemia   ? Ischemic cardiomyopathy   ? Joint pain   ? Joint swelling   ? Pneumonia 2014  ? Smokers' cough ([place])   ? Stress headaches   ? Tobacco abuse   ? ? ?Past Surgical History:  ?Procedure Laterality Date  ? ABDOMINAL HYSTERECTOMY    ? at age 89  ? BREAST BIOPSY Right   ? roughly 30 years ago with [facility]   ? cataract surgery Bilateral   ? CHOLECYSTECTOMY    ? colonscopy    ? ERCP    ? ESOPHAGOGASTRODUODENOSCOPY    ? LUMBAR LAMINECTOMY Right 07/25/2014  ? Procedure: Right L4 Hemilaminectomy, Right L4-5 Laminotomy , Decompression, Microdisectomy;  Surgeon: Marybelle Killings, MD;  Location: [place];  Service: Orthopedics;  Laterality: Right;  ? SHOULDER SURGERY Right   ? TONSILLECTOMY    ? as a child  ? ? ?Current Medications: ?No outpatient medications have been marked as taking for the 09/17/21 encounter (Appointment) with Janina Mayo, MD.  ?  ? ?Allergies:   Erythromycin  ? ?Social History  ? ?Socioeconomic History  ? Marital status: Married  ?  Spouse name: Shailee Foots  ? Number of children: Not on file  ? Years of education: Not on file  ? Highest education level: Not on file  ?Occupational History  ? Occupation: retired  ?Tobacco  Use  ? Smoking status: Every Day  ?  Packs/day: 0.50  ?  Years: 60.00  ?  Pack years: 30.00  ?  Types: Cigarettes  ? Smokeless tobacco: Never  ?Vaping Use  ? Vaping Use: Never used  ?Substance and Sexual Activity  ? Alcohol use: No  ? Drug use: No  ? Sexual activity: Yes  ?  Birth control/protection: Surgical  ?Other Topics Concern  ? Not on file  ?Social History Narrative  ? Not on file  ? ?Social Determinants of Health  ? ?Financial Resource Strain: Low Risk   ? Difficulty of Paying Living Expenses: Not hard at all  ?Food Insecurity: No Food Insecurity  ? Worried About Charity fundraiser in the Last Year: Never true  ? Ran Out of Food in the Last Year: Never true  ?Transportation Needs: No Transportation Needs  ? Lack of Transportation (Medical): No  ? Lack of Transportation (Non-Medical): No  ?Physical Activity: Not on file  ?Stress: Not on file  ?Social Connections: Not on file  ?  ? ?Family History: ?The patient's ***family history includes Breast cancer in her sister; CAD in an other family member; Heart disease  in her father and mother; Heart failure in her father and mother. ? ?ROS:   ?Please see the history of present illness.    ?*** All other systems reviewed and are negative. ? ?EKGs/Labs/Other Studies Reviewed:   ? ?The following studies were reviewed today: ?*** ? ?EKG:  EKG is *** ordered today.  The ekg ordered today demonstrates *** ? ?Recent Labs: ?06/11/2021: Magnesium 2.3; TSH 1.402 ?06/16/2021: B Natriuretic Peptide 300.4 ?06/17/2021: Hemoglobin 11.4; Platelets 284 ?07/22/2021: BUN 16; Creatinine, Ser 0.97; Potassium 3.9; Sodium 135 ?07/29/2021: ALT 14  ?Recent Lipid Panel ?   ?Component Value Date/Time  ? CHOL 112 07/29/2021 1507  ? CHOL 133 08/14/2020 1010  ? TRIG 148.0 07/29/2021 1507  ? HDL 39.10 07/29/2021 1507  ? HDL 41 08/14/2020 1010  ? CHOLHDL 3 07/29/2021 1507  ? VLDL 29.6 07/29/2021 1507  ? LDLCALC 44 07/29/2021 1507  ? [place] 63 08/14/2020 1010  ? ? ? ?Risk Assessment/Calculations:    ?{Does this patient have ATRIAL FIBRILLATION?[phone]} ? ?    ? ?Physical Exam:   ? ?VS:  There were no vitals taken for this visit.   ? ?Wt Readings from Last 3 Encounters:  ?09/14/21 173 lb 9.6 oz (78.7 kg)  ?08/31/21 178 lb 6.4 oz (80.9 kg)  ?08/10/21 178 lb (80.7 kg)  ?  ? ?GEN: *** Well nourished, well developed in no acute distress ?HEENT: Normal ?NECK: No JVD; No carotid bruits ?LYMPHATICS: No lymphadenopathy ?CARDIAC: ***RRR, no murmurs, rubs, gallops ?RESPIRATORY:  Clear to auscultation without rales, wheezing or rhonchi  ?ABDOMEN: Soft, non-tender, non-distended ?MUSCULOSKELETAL:  No edema; No deformity  ?SKIN: Warm and dry ?NEUROLOGIC:  Alert and oriented x 3 ?PSYCHIATRIC:  Normal affect  ? ?ASSESSMENT:   ? ?No diagnosis found. ?PLAN:   ? ?In order of problems listed above: ? ?*** ? ?   ? ?{Are you ordering a CV Procedure (e.g. stress test, cath, DCCV, TEE, etc)?   Press F2        :[id number]}  ? ? ?Medication Adjustments/Labs and Tests Ordered: ?Current medicines are reviewed at length with the patient today.  Concerns regarding medicines are outlined above.  ?No orders of the defined types were placed in this encounter. ? ?No orders of the defined types were placed in this encounter. ? ? ?There are no Patient Instructions on file for this visit.  ? ?Signed, ?Janina Mayo, MD  ?09/17/2021 8:13 AM    ?[place] ?

## 2021-09-17 NOTE — Progress Notes (Signed)
?Cardiology Office Note:   ? ?Date:  09/18/2021  ? ?ID:  Terri [name], DOB June 23, 1945, MRN [id number] ? ?PCP:  Libby Maw, MD ?  ?[facility] Providers ?Cardiologist:  Quay Burow, MD    ? ?Referring MD: Libby Maw,*  ? ?No chief complaint on file. ?Follow up ? ?History of Present Illness:   ? ?Terri [name] is a 76 y.o. female with a hx of smoking, CAD s/p anterior stemi 01/2015 DESx2 prox LAD, recent DES to prox and mid  RCA ? ?She brings in [place] summary from [facility]. She presented with palpitations and SOB to the hospital there. She also reported indigestion.. She underwent LHC, PCI of an occluded mid and prox RCA (written on  the back of her DC summary). Noted to be 99% on  a voicemail from the internationalist. Echo showed EF 40%, mild MR, no aortic stenosis or AI, no pericardial effusion. Her husband noted that her blood pressure dropped suddenly.  She had CPR. No shockable rhythm. This was March 24 , 2023.  She feels much better. She's on DAPT. Her blood pressure is well controlled. Still smoking. No PND. No orthopnea. No Le edema.  ? ?Cardiology Studies ?Myoview-08/10/2021-large defect with moderate intensity in the apical to basal anterior, anteroseptal and apex that's fixed ? ?06/11/2021-TTE- EF 40-45%, mildly reduced. ?04/28/2021- EF 50-55, RV is normal, mild-mod MR, AI mild to moderate ? ?Past Medical History:  ?Diagnosis Date  ? Aortic insufficiency   ? a. Mild AI by echo 2014.  ? Arthritis   ? CAD (coronary artery disease)   ? a. Anterior STEMI 01/2015 s/p overlapping DES x2 to prox LAD, DES to prox RCA in [facility].  ? Chronic back pain   ? stenosis/HNP  ? History of bronchitis 2014  ? History of colon polyps   ? Hyperlipidemia   ? Ischemic cardiomyopathy   ? Joint pain   ? Joint swelling   ? Pneumonia 2014  ? Smokers' cough ([place])   ? Stress headaches   ? Tobacco abuse   ? ? ?Past Surgical History:  ?Procedure Laterality Date  ? ABDOMINAL HYSTERECTOMY    ? at  age 54  ? BREAST BIOPSY Right   ? roughly 30 years ago with [facility]   ? cataract surgery Bilateral   ? CHOLECYSTECTOMY    ? colonscopy    ? ERCP    ? ESOPHAGOGASTRODUODENOSCOPY    ? LUMBAR LAMINECTOMY Right 07/25/2014  ? Procedure: Right L4 Hemilaminectomy, Right L4-5 Laminotomy , Decompression, Microdisectomy;  Surgeon: Marybelle Killings, MD;  Location: [place];  Service: Orthopedics;  Laterality: Right;  ? SHOULDER SURGERY Right   ? TONSILLECTOMY    ? as a child  ? ? ?Current Medications: ?Current Outpatient Medications on File Prior to Visit  ?Medication Sig Dispense Refill  ? albuterol (VENTOLIN HFA) 108 (90 Base) MCG/ACT inhaler Inhale 1-2 puffs into the lungs every 6 (six) hours as needed for wheezing or shortness of breath. And or cough. 1 each 0  ? atorvastatin (LIPITOR) 80 MG tablet Take 1 tablet (80 mg total) by mouth daily. 90 tablet 3  ? carvedilol (COREG) 6.25 MG tablet Take 1 tablet (6.25 mg total) by mouth 2 (two) times daily with a meal. TAKE 1 TABLET BY MOUTH TWICE DAILY 60 tablet 6  ? clopidogrel (PLAVIX) 75 MG tablet Take 1 tablet (75 mg total) by mouth daily. 90 tablet 3  ? [facility] ASPIRIN 81 MG EC tablet  Take 81 mg by mouth daily.  0  ? furosemide (LASIX) 40 MG tablet Take 1 tablet (40 mg total) by mouth daily. 90 tablet 3  ? lisinopril (ZESTRIL) 40 MG tablet Take 1 tablet (40 mg total) by mouth daily. 30 tablet 6  ? nitroGLYCERIN (NITROSTAT) 0.4 MG SL tablet Place 1 tablet (0.4 mg total) under the tongue every 5 (five) minutes as needed for chest pain. 25 tablet 0  ? pantoprazole (PROTONIX) 40 MG tablet Take 1 tablet (40 mg total) by mouth daily. 30 tablet 3  ? ?No current facility-administered medications on file prior to visit.  ? ? ? ?Allergies:   Erythromycin  ? ?Social History  ? ?Socioeconomic History  ? Marital status: Married  ?  Spouse name: Belladonna Lubinski  ? Number of children: Not on file  ? Years of education: Not on file  ? Highest education level: Not on file  ?Occupational History  ?  Occupation: retired  ?Tobacco Use  ? Smoking status: Every Day  ?  Packs/day: 0.50  ?  Years: 60.00  ?  Pack years: 30.00  ?  Types: Cigarettes  ? Smokeless tobacco: Never  ?Vaping Use  ? Vaping Use: Never used  ?Substance and Sexual Activity  ? Alcohol use: No  ? Drug use: No  ? Sexual activity: Yes  ?  Birth control/protection: Surgical  ?Other Topics Concern  ? Not on file  ?Social History Narrative  ? Not on file  ? ?Social Determinants of Health  ? ?Financial Resource Strain: Low Risk   ? Difficulty of Paying Living Expenses: Not hard at all  ?Food Insecurity: No Food Insecurity  ? Worried About Charity fundraiser in the Last Year: Never true  ? Ran Out of Food in the Last Year: Never true  ?Transportation Needs: No Transportation Needs  ? Lack of Transportation (Medical): No  ? Lack of Transportation (Non-Medical): No  ?Physical Activity: Not on file  ?Stress: Not on file  ?Social Connections: Not on file  ?  ? ?Family History: ?The patient's family history includes Breast cancer in her sister; CAD in an other family member; Heart disease in her father and mother; Heart failure in her father and mother. ? ?ROS:   ?Please see the history of present illness.    ? All other systems reviewed and are negative. ? ?EKGs/Labs/Other Studies Reviewed:   ? ?The following studies were reviewed today: ? ? ?EKG:  EKG is  ordered today.  The ekg ordered today demonstrates  ? ?EKG 09/17/2021 - NSR, minimal LVH ? ?Recent Labs: ?06/11/2021: Magnesium 2.3; TSH 1.402 ?06/16/2021: B Natriuretic Peptide 300.4 ?06/17/2021: Hemoglobin 11.4; Platelets 284 ?07/22/2021: BUN 16; Creatinine, Ser 0.97; Potassium 3.9; Sodium 135 ?07/29/2021: ALT 14  ?Recent Lipid Panel ?   ?Component Value Date/Time  ? CHOL 112 07/29/2021 1507  ? CHOL 133 08/14/2020 1010  ? TRIG 148.0 07/29/2021 1507  ? HDL 39.10 07/29/2021 1507  ? HDL 41 08/14/2020 1010  ? CHOLHDL 3 07/29/2021 1507  ? VLDL 29.6 07/29/2021 1507  ? LDLCALC 44 07/29/2021 1507  ? [place] 63  08/14/2020 1010  ? ? ? ?Risk Assessment/Calculations:   ?  ? ?    ? ?Physical Exam:   ? ?VS:  BP 116/70   Pulse 69   Ht 5' 2.5" (1.588 m)   Wt 176 lb 9.6 oz (80.1 kg)   SpO2 97%   BMI 31.79 kg/m?    ? ?Wt Readings from Last 3  Encounters:  ?09/17/21 176 lb 9.6 oz (80.1 kg)  ?09/14/21 173 lb 9.6 oz (78.7 kg)  ?08/31/21 178 lb 6.4 oz (80.9 kg)  ?  ? ?GEN:  Well nourished, well developed in no acute distress ?HEENT: Normal ?NECK: No JVD; No carotid bruits ?LYMPHATICS: No lymphadenopathy ?CARDIAC: RRR, no murmurs, rubs, gallops ?RESPIRATORY:  Clear to auscultation without rales, wheezing or rhonchi  ?ABDOMEN: Soft, non-tender, non-distended ?MUSCULOSKELETAL:  No edema; No deformity  ?SKIN: Warm and dry ?NEUROLOGIC:  Alert and oriented x 3 ?PSYCHIATRIC:  Normal affect  ? ?ASSESSMENT:   ? ?Ischemic cardiomyopathy: SE83-15. BP ok; mildly borderline on the lower side, will reconsider entresto on next visit. She is doing well on lisinopril 40 mg daily and coreg 6.25 mg BID. Will continue lasix. She is euvolemic. She is asymptomatic. Will continue asa and plavix 08/28/2022. ? ? ?PLAN:   ? ?In order of problems listed above: ? ?No changes ?Follow up 3 months ? ?   ? ?   ? ? ?Medication Adjustments/Labs and Tests Ordered: ?Current medicines are reviewed at length with the patient today.  Concerns regarding medicines are outlined above.  ?Orders Placed This Encounter  ?Procedures  ? EKG 12-Lead  ? ?No orders of the defined types were placed in this encounter. ? ? ?Patient Instructions  ?Medication Instructions:  ?The current medical regimen is effective;  continue present plan and medications. ? ?*If you need a refill on your cardiac medications before your next appointment, please call your pharmacy* ? ? ?Follow-Up: ?At [facility], you and your health needs are our priority.  As part of our continuing mission to provide you with exceptional heart care, we have created designated Provider Care Teams.  These Care Teams  include your primary Cardiologist (physician) and Advanced Practice Providers (APPs -  Physician Assistants and Nurse Practitioners) who all work together to provide you with the care you need, when you need it. ? ?We recommen

## 2021-09-20 DIAGNOSIS — Z20822 Contact with and (suspected) exposure to covid-19: Secondary | ICD-10-CM | POA: Diagnosis not present

## 2021-09-21 ENCOUNTER — Ambulatory Visit: Payer: Medicare Other | Admitting: Cardiovascular Disease

## 2021-10-11 DIAGNOSIS — Z20822 Contact with and (suspected) exposure to covid-19: Secondary | ICD-10-CM | POA: Diagnosis not present

## 2021-10-13 ENCOUNTER — Ambulatory Visit (INDEPENDENT_AMBULATORY_CARE_PROVIDER_SITE_OTHER): Payer: Medicare Other

## 2021-10-13 DIAGNOSIS — Z0000 Encounter for general adult medical examination without abnormal findings: Secondary | ICD-10-CM

## 2021-10-13 NOTE — Progress Notes (Signed)
Subjective:   Terri [name] is a 76 y.o. female who presents for Medicare Annual (Subsequent) preventive examination.   I connected with Doreene Forrey today by telephone and verified that I am speaking with the correct person using two identifiers. Location patient: home Location provider: work Persons participating in the virtual visit: patient, provider.   I discussed the limitations, risks, security and privacy concerns of performing an evaluation and management service by telephone and the availability of in person appointments. I also discussed with the patient that there may be a patient responsible charge related to this service. The patient expressed understanding and verbally consented to this telephonic visit.    Interactive audio and video telecommunications were attempted between this provider and patient, however failed, due to patient having technical difficulties OR patient did not have access to video capability.  We continued and completed visit with audio only.    Review of Systems     Cardiac Risk Factors include: advanced age (>35mn, >>46women)     Objective:    Today's Vitals   There is no height or weight on file to calculate BMI.     10/13/2021    8:25 AM 06/17/2021    1:11 PM 06/11/2021    6:50 AM 06/10/2021    6:10 AM 07/14/2020   12:49 PM 06/12/2019   10:17 AM 07/15/2014   10:14 AM  Advanced Directives  Does Patient Have a Medical Advance Directive? No No No No No No No  Would patient like information on creating a medical advance directive? No - Patient declined  No - Patient declined No - Patient declined No - Patient declined No - Patient declined No - patient declined information    Current Medications (verified) Outpatient Encounter Medications as of 10/13/2021  Medication Sig   albuterol (VENTOLIN HFA) 108 (90 Base) MCG/ACT inhaler Inhale 1-2 puffs into the lungs every 6 (six) hours as needed for wheezing or shortness of breath. And or cough.    atorvastatin (LIPITOR) 80 MG tablet Take 1 tablet (80 mg total) by mouth daily.   carvedilol (COREG) 6.25 MG tablet Take 1 tablet (6.25 mg total) by mouth 2 (two) times daily with a meal. TAKE 1 TABLET BY MOUTH TWICE DAILY   clopidogrel (PLAVIX) 75 MG tablet Take 1 tablet (75 mg total) by mouth daily.   [facility] ASPIRIN 81 MG EC tablet Take 81 mg by mouth daily.   lisinopril (ZESTRIL) 40 MG tablet Take 1 tablet (40 mg total) by mouth daily.   nitroGLYCERIN (NITROSTAT) 0.4 MG SL tablet Place 1 tablet (0.4 mg total) under the tongue every 5 (five) minutes as needed for chest pain.   pantoprazole (PROTONIX) 40 MG tablet Take 1 tablet (40 mg total) by mouth daily.   furosemide (LASIX) 40 MG tablet Take 1 tablet (40 mg total) by mouth daily.   No facility-administered encounter medications on file as of 10/13/2021.    Allergies (verified) Erythromycin   History: Past Medical History:  Diagnosis Date   Aortic insufficiency    a. Mild AI by echo 2014.   Arthritis    CAD (coronary artery disease)    a. Anterior STEMI 01/2015 s/p [facility] DES [facility] to prox LAD[facility]   Chronic back pain    stenosis/HNP   History of bronchitis 2014   History of colon polyps    Hyperlipidemia    Ischemic cardiomyopathy    Joint pain  Joint swelling    Pneumonia 2014   Smokers' cough ([place])    Stress headaches    Tobacco abuse    Past Surgical History:  Procedure Laterality Date   ABDOMINAL HYSTERECTOMY     at age 23   BREAST BIOPSY Right    roughly 30 years ago with [facility]    cataract surgery Bilateral    CHOLECYSTECTOMY     colonscopy     ERCP     ESOPHAGOGASTRODUODENOSCOPY     LUMBAR LAMINECTOMY Right 07/25/2014   Procedure: Right L4 Hemilaminectomy, Right L4-5 Laminotomy , Decompression, Microdisectomy;  Surgeon: Marybelle Killings, MD;  Location: [place];  Service: Orthopedics;  Laterality: Right;   SHOULDER SURGERY Right    TONSILLECTOMY     as a child   Family  History  Problem Relation Age of Onset   Heart disease Mother    Heart failure Mother    Heart disease Father    Heart failure Father    CAD Other        both parents and multiple siblings with either died or had-related issues   Breast cancer Sister    Social History   Socioeconomic History   Marital status: Married    Spouse name: Brynnly Bonet   Number of children: Not on file   Years of education: Not on file   Highest education level: Not on file  Occupational History   Occupation: retired  Tobacco Use   Smoking status: Every Day    Packs/day: 0.50    Years: 60.00    Pack years: 30.00    Types: Cigarettes   Smokeless tobacco: Never  Vaping Use   Vaping Use: Never used  Substance and Sexual Activity   Alcohol use: No   Drug use: No   Sexual activity: Yes    Birth control/protection: Surgical  Other Topics Concern   Not on file  Social History Narrative   Not on file   Social Determinants of Health   Financial Resource Strain: Low Risk    Difficulty of Paying Living Expenses: Not hard at all  Food Insecurity: No Food Insecurity   Worried About Charity fundraiser in the Last Year: Never true   [place] in the Last Year: Never true  Transportation Needs: No Transportation Needs   Lack of Transportation (Medical): No   Lack of Transportation (Non-Medical): No  Physical Activity: Inactive   Days of Exercise per Week: 0 days   Minutes of Exercise per Session: 0 min  Stress: No Stress Concern Present   Feeling of Stress : Not at all  Social Connections: Moderately Integrated   Frequency of Communication with Friends and Family: Three times a week   Frequency of Social Gatherings with Friends and Family: Three times a week   Attends Religious Services: More than 4 times per year   Active Member of Clubs or Organizations: No   Attends Archivist Meetings: Never   Marital Status: Married    Tobacco Counseling Ready to quit: Not  Answered Counseling given: Not Answered   Clinical Intake:  Pre-visit preparation completed: Yes  Pain : No/denies pain     Nutritional Risks: None  How often do you need to have someone help you when you read instructions, pamphlets, or other written materials from your doctor or pharmacy?: 1 - Never What is the last grade level you completed in school?: high school  Diabetic?no   Interpreter Needed?: No  Information  entered by :: [employer],LPN   Activities of Daily Living    10/13/2021    8:28 AM 06/11/2021   11:00 AM  In your present state of health, do you have any difficulty performing the following activities:  Hearing? 0 0  Vision? 0 0  Difficulty concentrating or making decisions? 0 0  Walking or climbing stairs? 0 1  Dressing or bathing? 0 0  Doing errands, shopping? 0 0  Preparing Food and eating ? N   Using the Toilet? N   In the past six months, have you accidently leaked urine? N   Do you have problems with loss of bowel control? N   Managing your Medications? N   Managing your Finances? N   Housekeeping or managing your Housekeeping? N     Patient Care Team: Libby Maw, MD as PCP - General (Family Medicine) Lorretta Harp, MD as PCP - Cardiology (Cardiology)  Indicate any recent Medical Services you may have received from other than [facility] providers in the past year (date may be approximate).     Assessment:   This is a routine wellness examination for Dakari.  Hearing/Vision screen Vision Screening - Comments:: Declined   Dietary issues and exercise activities discussed: Current Exercise Habits: Home exercise routine, Type of exercise: walking, Time (Minutes): 30, Frequency (Times/Week): 3, Weekly Exercise (Minutes/Week): 90, Intensity: Mild, Exercise limited by: None identified   Goals Addressed   None    Depression Screen    10/13/2021    8:24 AM 08/31/2021   11:28 AM 07/29/2021    2:28 PM 09/02/2020   11:02 AM 07/14/2020    12:51 PM 06/27/2019    2:09 PM 06/12/2019   10:22 AM  PHQ 2/9 Scores  PHQ - 2 Score 0 0 0 0 1 0 1    Fall Risk    10/13/2021    8:26 AM 08/31/2021   11:28 AM 07/29/2021    2:28 PM 09/02/2020   11:02 AM 07/14/2020   12:50 PM  Fall Risk   Falls in the past year? 0 0 1 0 1  Number falls in past yr: 0 0 1  1  Injury with Fall? 0  1  0  Risk for fall due to :     History of fall(s)  Follow up Falls evaluation completed    Falls prevention discussed    FALL RISK PREVENTION PERTAINING TO THE HOME:  Any stairs in or around the home? Yes  If so, are there any without handrails? No  Home free of loose throw rugs in walkways, pet beds, electrical cords, etc? Yes  Adequate lighting in your home to reduce risk of falls? Yes   ASSISTIVE DEVICES UTILIZED TO PREVENT FALLS:  Life alert? No  Use of a cane, walker or w/c? No  Grab bars in the bathroom? No  Shower chair or bench in shower? Yes  Elevated toilet seat or a handicapped toilet? No   Cognitive Function:    Normal cognitive status assessed by telephone conversation  by this Nurse Health Advisor. No abnormalities found.      Immunizations Immunization History  Administered Date(s) Administered   Fluad Quad(high Dose 65+) 01/31/2019   Influenza, High Dose Seasonal PF 03/16/2018   Influenza-Unspecified 04/06/2020, 03/15/2021   PFIZER(Purple Top)SARS-COV-2 Vaccination 07/05/2019, 07/26/2019, 03/21/2020   Pneumococcal Conjugate-13 07/14/2020   Pneumococcal Polysaccharide-23 09/11/2017    TDAP status: Due, Education has been provided regarding the importance of this vaccine. Advised may receive this vaccine  at local pharmacy or Health Dept. Aware to provide a copy of the vaccination record if obtained from local pharmacy or Health Dept. Verbalized acceptance and understanding.  Flu Vaccine status: Up to date  Pneumococcal vaccine status: Up to date  Covid-19 vaccine status: Completed vaccines  Qualifies for Shingles Vaccine? Yes    Zostavax completed No   Shingrix Completed?: No.    Education has been provided regarding the importance of this vaccine. Patient has been advised to call insurance company to determine out of pocket expense if they have not yet received this vaccine. Advised may also receive vaccine at local pharmacy or Health Dept. Verbalized acceptance and understanding.  Screening Tests Health Maintenance  Topic Date Due   Hepatitis C Screening  Never done   COLONOSCOPY (Pts 45-19yr Insurance coverage will need to be confirmed)  07/28/2022 (Originally 10/08/1991)   Zoster Vaccines- Shingrix (1 of 2) 07/28/2022 (Originally [birth year]/1965)   TETANUS/TDAP  07/29/2022 (Originally 10/07/1964)   INFLUENZA VACCINE  01/04/2022   Pneumonia Vaccine 75 Years old  Completed   DEXA SCAN  Completed   HPV VACCINES  Aged Out   COVID-19 Vaccine  Discontinued    Health Maintenance  Health Maintenance Due  Topic Date Due   Hepatitis C Screening  Never done    Colorectal cancer screening: No longer required.   Mammogram status: No longer required due to age.  Bone Density status: Completed 10/03/2017. Results reflect: Bone density results: OSTEOPENIA. Repeat every 5 years.  Lung Cancer Screening: (Low Dose CT Chest recommended if Age 75-80years, 30 pack-year currently smoking OR have quit w/in 15years.) does not qualify.   Lung Cancer Screening Referral: n/a  Additional Screening:  Hepatitis C Screening: does not qualify;  Vision Screening: Recommended annual ophthalmology exams for early detection of glaucoma and other disorders of the eye. Is the patient up to date with their annual eye exam?  No  Who is the provider or what is the name of the office in which the patient attends annual eye exams? Declined  If pt is not established with a provider, would they like to be referred to a provider to establish care? No .   Dental Screening: Recommended annual dental exams for proper oral hygiene  Community  Resource Referral / Chronic Care Management: CRR required this visit?  No   CCM required this visit?  No      Plan:     I have personally reviewed and noted the following in the patient's chart:   Medical and social history Use of alcohol, tobacco or illicit drugs  Current medications and supplements including opioid prescriptions.  Functional ability and status Nutritional status Physical activity Advanced directives List of other physicians Hospitalizations, surgeries, and ER visits in previous 12 months Vitals Screenings to include cognitive, depression, and falls Referrals and appointments  In addition, I have reviewed and discussed with patient certain preventive protocols, quality metrics, and best practice recommendations. A written personalized care plan for preventive services as well as general preventive health recommendations were provided to patient.     LRandel Pigg LPN   [id number]  Nurse Notes: none

## 2021-10-13 NOTE — Patient Instructions (Signed)
Terri [name] , ?Thank you for taking time to come for your Medicare Wellness Visit. I appreciate your ongoing commitment to your health goals. Please review the following plan we discussed and let me know if I can assist you in the future.  ? ?Screening recommendations/referrals: ?Colonoscopy: no longer required  ?Mammogram: no longer required  ?Bone Density: 10/03/2017 ?Recommended yearly ophthalmology/optometry visit for glaucoma screening and checkup ?Recommended yearly dental visit for hygiene and checkup ? ?Vaccinations: ?Influenza vaccine: completed  ?Pneumococcal vaccine: completed  ?Tdap vaccine: due  ?Shingles vaccine: will consider    ? ?Advanced directives: none  ? ?Conditions/risks identified: none  ? ?Next appointment: none  ? ? ?Preventive Care 60 Years and Older, Female ?Preventive care refers to lifestyle choices and visits with your health care provider that can promote health and wellness. ?What does preventive care include? ?A yearly physical exam. This is also called an annual well check. ?Dental exams once or twice a year. ?Routine eye exams. Ask your health care provider how often you should have your eyes checked. ?Personal lifestyle choices, including: ?Daily care of your teeth and gums. ?Regular physical activity. ?Eating a healthy diet. ?Avoiding tobacco and drug use. ?Limiting alcohol use. ?Practicing safe sex. ?Taking low-dose aspirin every day. ?Taking vitamin and mineral supplements as recommended by your health care provider. ?What happens during an annual well check? ?The services and screenings done by your health care provider during your annual well check will depend on your age, overall health, lifestyle risk factors, and family history of disease. ?Counseling  ?Your health care provider may ask you questions about your: ?Alcohol use. ?Tobacco use. ?Drug use. ?Emotional well-being. ?Home and relationship well-being. ?Sexual activity. ?Eating habits. ?History of falls. ?Memory and  ability to understand (cognition). ?Work and work Statistician. ?Reproductive health. ?Screening  ?You may have the following tests or measurements: ?Height, weight, and BMI. ?Blood pressure. ?Lipid and cholesterol levels. These may be checked every 5 years, or more frequently if you are over 83 years old. ?Skin check. ?Lung cancer screening. You may have this screening every year starting at age 56 if you have a 30-pack-year history of smoking and currently smoke or have quit within the past 15 years. ?Fecal occult blood test (FOBT) of the stool. You may have this test every year starting at age 80. ?Flexible sigmoidoscopy or colonoscopy. You may have a sigmoidoscopy every 5 years or a colonoscopy every 10 years starting at age 68. ?Hepatitis C blood test. ?Hepatitis B blood test. ?Sexually transmitted disease (STD) testing. ?Diabetes screening. This is done by checking your blood sugar (glucose) after you have not eaten for a while (fasting). You may have this done every 1-3 years. ?Bone density scan. This is done to screen for osteoporosis. You may have this done starting at age 29. ?Mammogram. This may be done every 1-2 years. Talk to your health care provider about how often you should have regular mammograms. ?Talk with your health care provider about your test results, treatment options, and if necessary, the need for more tests. ?Vaccines  ?Your health care provider may recommend certain vaccines, such as: ?Influenza vaccine. This is recommended every year. ?Tetanus, diphtheria, and acellular pertussis (Tdap, Td) vaccine. You may need a Td booster every 10 years. ?Zoster vaccine. You may need this after age 71. ?Pneumococcal 13-valent conjugate (PCV13) vaccine. One dose is recommended after age 70. ?Pneumococcal polysaccharide (PPSV23) vaccine. One dose is recommended after age 56. ?Talk to your health care provider about which screenings  and vaccines you need and how often you need them. ?This information is  not intended to replace advice given to you by your health care provider. Make sure you discuss any questions you have with your health care provider. ?Document Released: 06/19/2015 Document Revised: 02/10/2016 Document Reviewed: 03/24/2015 ?Elsevier Interactive Patient Education ? 2017 [place]. ? ?Fall Prevention in the Home ?Falls can cause injuries. They can happen to people of all ages. There are many things you can do to make your home safe and to help prevent falls. ?What can I do on the outside of my home? ?Regularly fix the edges of walkways and driveways and fix any cracks. ?Remove anything that might make you trip as you walk through a door, such as a raised step or threshold. ?Trim any bushes or trees on the path to your home. ?Use bright outdoor lighting. ?Clear any walking paths of anything that might make someone trip, such as rocks or tools. ?Regularly check to see if handrails are loose or broken. Make sure that both sides of any steps have handrails. ?Any raised decks and porches should have guardrails on the edges. ?Have any leaves, snow, or ice cleared regularly. ?Use sand or salt on walking paths during winter. ?Clean up any spills in your garage right away. This includes oil or grease spills. ?What can I do in the bathroom? ?Use night lights. ?Install grab bars by the toilet and in the tub and shower. Do not use towel bars as grab bars. ?Use non-skid mats or decals in the tub or shower. ?If you need to sit down in the shower, use a plastic, non-slip stool. ?Keep the floor dry. Clean up any water that spills on the floor as soon as it happens. ?Remove soap buildup in the tub or shower regularly. ?Attach bath mats securely with double-sided non-slip rug tape. ?Do not have throw rugs and other things on the floor that can make you trip. ?What can I do in the bedroom? ?Use night lights. ?Make sure that you have a light by your bed that is easy to reach. ?Do not use any sheets or blankets that  are too big for your bed. They should not hang down onto the floor. ?Have a firm chair that has side arms. You can use this for support while you get dressed. ?Do not have throw rugs and other things on the floor that can make you trip. ?What can I do in the kitchen? ?Clean up any spills right away. ?Avoid walking on wet floors. ?Keep items that you use a lot in easy-to-reach places. ?If you need to reach something above you, use a strong step stool that has a grab bar. ?Keep electrical cords out of the way. ?Do not use floor polish or wax that makes floors slippery. If you must use wax, use non-skid floor wax. ?Do not have throw rugs and other things on the floor that can make you trip. ?What can I do with my stairs? ?Do not leave any items on the stairs. ?Make sure that there are handrails on both sides of the stairs and use them. Fix handrails that are broken or loose. Make sure that handrails are as long as the stairways. ?Check any carpeting to make sure that it is firmly attached to the stairs. Fix any carpet that is loose or worn. ?Avoid having throw rugs at the top or bottom of the stairs. If you do have throw rugs, attach them to the floor with carpet tape. ?  Make sure that you have a light switch at the top of the stairs and the bottom of the stairs. If you do not have them, ask someone to add them for you. ?What else can I do to help prevent falls? ?Wear shoes that: ?Do not have high heels. ?Have rubber bottoms. ?Are comfortable and fit you well. ?Are closed at the toe. Do not wear sandals. ?If you use a stepladder: ?Make sure that it is fully opened. Do not climb a closed stepladder. ?Make sure that both sides of the stepladder are locked into place. ?Ask someone to hold it for you, if possible. ?Clearly mark and make sure that you can see: ?Any grab bars or handrails. ?First and last steps. ?Where the edge of each step is. ?Use tools that help you move around (mobility aids) if they are needed. These  include: ?Canes. ?Walkers. ?Scooters. ?Crutches. ?Turn on the lights when you go into a dark area. Replace any light bulbs as soon as they burn out. ?Set up your furniture so you have a clear path. Avoid mo

## 2021-11-02 ENCOUNTER — Ambulatory Visit (INDEPENDENT_AMBULATORY_CARE_PROVIDER_SITE_OTHER): Payer: Medicare Other | Admitting: Family Medicine

## 2021-11-02 ENCOUNTER — Encounter: Payer: Self-pay | Admitting: Family Medicine

## 2021-11-02 VITALS — BP 100/68 | HR 70 | Temp 97.0°F | Ht 62.0 in | Wt 175.6 lb

## 2021-11-02 DIAGNOSIS — Z720 Tobacco use: Secondary | ICD-10-CM

## 2021-11-02 DIAGNOSIS — L720 Epidermal cyst: Secondary | ICD-10-CM | POA: Diagnosis not present

## 2021-11-02 DIAGNOSIS — N1831 Chronic kidney disease, stage 3a: Secondary | ICD-10-CM | POA: Diagnosis not present

## 2021-11-02 NOTE — Progress Notes (Signed)
Established Patient Office Visit  Subjective   Patient ID: Terri [name], female    DOB: 1946/03/29  Age: 76 y.o. MRN: [id number]  Chief Complaint  Patient presents with   Follow-up    3 month follow up, no concerns.     HPI follow-up of inclusion cyst, CKD 3A, and tobacco use.  She was able to see cardiology.  Last lipid profile was excellent.  Blood pressure is well controlled.  Continues to smoke.  Cyst of axilla has resolved.    Review of Systems  Constitutional:  Negative for chills, diaphoresis, malaise/fatigue and weight loss.  HENT: Negative.    Eyes: Negative.  Negative for blurred vision and double vision.  Respiratory:  Positive for cough. Negative for sputum production and wheezing.   Cardiovascular:  Negative for chest pain.  Gastrointestinal:  Positive for vomiting. Negative for abdominal pain.  Genitourinary: Negative.   Musculoskeletal:  Negative for falls and myalgias.  Neurological:  Negative for speech change, loss of consciousness and weakness.  Psychiatric/Behavioral: Negative.       Objective:     BP 100/68 (BP Location: Right Arm, Patient Position: Sitting, Cuff Size: Normal)   Pulse 70   Temp (!) 97 F (36.1 C) (Temporal)   Ht '5\' 2"'$  (1.575 m)   Wt 175 lb 9.6 oz (79.7 kg)   SpO2 95%   BMI 32.12 kg/m    Physical Exam Constitutional:      General: She is not in acute distress.    Appearance: Normal appearance. She is not ill-appearing, toxic-appearing or diaphoretic.  HENT:     Head: Normocephalic and atraumatic.     Right Ear: External ear normal.     Left Ear: External ear normal.     Mouth/Throat:     Mouth: Mucous membranes are moist.     Pharynx: Oropharynx is clear. No oropharyngeal exudate or posterior oropharyngeal erythema.  Eyes:     General: No scleral icterus.       Right eye: No discharge.        Left eye: No discharge.     Extraocular Movements: Extraocular movements intact.     Conjunctiva/sclera: Conjunctivae normal.      Pupils: Pupils are equal, round, and reactive to light.  Cardiovascular:     Rate and Rhythm: Normal rate and regular rhythm.  Pulmonary:     Effort: Pulmonary effort is normal. No respiratory distress.     Breath sounds: Decreased air movement present.  Abdominal:     General: Bowel sounds are normal.     Tenderness: There is no abdominal tenderness. There is no guarding.  Musculoskeletal:     Cervical back: No rigidity or tenderness.  Skin:    General: Skin is warm and dry.     Comments: In the left axilla there is no pustule or draining mass.  Neurological:     Mental Status: She is alert and oriented to person, place, and time.  Psychiatric:        Mood and Affect: Mood normal.        Behavior: Behavior normal.     No results found for any visits on 11/02/21.    The ASCVD Risk score (Arnett DK, et al., 2019) failed to calculate for the following reasons:   The valid total cholesterol range is 130 to 320 mg/dL    Assessment & Plan:   Problem List Items Addressed This Visit       Genitourinary   Stage 3a  chronic kidney disease ([place])   Relevant Orders   Basic metabolic panel     Other   Tobacco use - Primary   Inclusion cyst    Return in about 3 months (around 02/02/2022).  Discussed smoking cessation with patient children.  Advised her to join a program that is available to help people quit.  She is not interested in doing that right now.  Rechecking GFR.  Advised her to check and record her blood pressures at home periodically.  Inclusion cyst has resolved.  Libby Maw, MD

## 2021-11-03 LAB — BASIC METABOLIC PANEL
BUN: 21 mg/dL (ref 6–23)
CO2: 23 mEq/L (ref 19–32)
Calcium: 9.2 mg/dL (ref 8.4–10.5)
Chloride: 101 mEq/L (ref 96–112)
Creatinine, Ser: 1.15 mg/dL (ref 0.40–1.20)
GFR: 46.74 mL/min — ABNORMAL LOW (ref >60.00)
Glucose, Bld: 85 mg/dL (ref 70–99)
Potassium: 4 mEq/L (ref 3.5–5.1)
Sodium: 135 mEq/L (ref 135–145)

## 2021-11-04 ENCOUNTER — Other Ambulatory Visit: Payer: Self-pay

## 2021-11-04 DIAGNOSIS — K219 Gastro-esophageal reflux disease without esophagitis: Secondary | ICD-10-CM

## 2021-11-04 MED ORDER — PANTOPRAZOLE SODIUM 40 MG PO TBEC: 40.0000 mg | DELAYED_RELEASE_TABLET | Freq: Every day | ORAL | 0 refills | Status: DC

## 2021-11-18 ENCOUNTER — Encounter: Payer: Self-pay | Admitting: Surgery

## 2021-11-18 ENCOUNTER — Ambulatory Visit (INDEPENDENT_AMBULATORY_CARE_PROVIDER_SITE_OTHER): Payer: Medicare Other | Admitting: Surgery

## 2021-11-18 VITALS — BP 162/71 | HR 71 | Ht 62.0 in | Wt 175.6 lb

## 2021-11-18 DIAGNOSIS — I255 Ischemic cardiomyopathy: Secondary | ICD-10-CM | POA: Diagnosis not present

## 2021-11-18 DIAGNOSIS — M5416 Radiculopathy, lumbar region: Secondary | ICD-10-CM

## 2021-11-18 DIAGNOSIS — Z9889 Other specified postprocedural states: Secondary | ICD-10-CM

## 2021-11-18 MED ORDER — METHOCARBAMOL 500 MG PO TABS: 500.0000 mg | ORAL_TABLET | Freq: Three times a day (TID) | ORAL | 0 refills | Status: DC | PRN

## 2021-11-18 MED ORDER — METHYLPREDNISOLONE 4 MG PO TABS: ORAL_TABLET | ORAL | 0 refills | Status: DC

## 2021-11-18 NOTE — Progress Notes (Signed)
Office Visit Note   Patient: Terri [name]           Date of Birth: Sep 07, 1945           MRN: [id number] Visit Date: 11/18/2021              Requested by: Libby Maw, MD [address] [place],  [place] [address] PCP: Libby Maw, MD   Assessment & Plan: Visit Diagnoses:  1. Lumbar radiculopathy   2. Hx of microdiscectomy     Plan: Today I sent in prescriptions for Medrol Dosepak 6-day taper to be taken as directed and Robaxin for spasms to her pharmacy.  Follow-up with Dr. Lorin Mercy in 2 weeks for recheck and if she has not had any improvement then she will likely need repeat lumbar MRI to compare to the study that was done last year.  Follow-Up Instructions: Return in about 2 weeks (around 12/02/2021) for WITH DR [place] RECHECK LUMBAR.   Orders:  No orders of the defined types were placed in this encounter.  Meds ordered this encounter  Medications   methylPREDNISolone (MEDROL) 4 MG tablet    Sig: 6-day taper to be taken as directed    Dispense:  21 tablet    Refill:  0   methocarbamol (ROBAXIN) 500 MG tablet    Sig: Take 1 tablet (500 mg total) by mouth every 8 (eight) hours as needed for muscle spasms.    Dispense:  40 tablet    Refill:  0      Procedures: No procedures performed   Clinical Data: No additional findings.   Subjective: Chief Complaint  Patient presents with   Lower Back - Pain    HPI Terri [name] 76-year-old white female comes in today with complaints of low back pain and left lower extremity radiculopathy.  Patient is status post right L4-5 hemilaminectomy microdiscectomy by Dr. Lorin Mercy July 25, 2014.  Seen by Dr. Lorin Mercy last year again with back pain and she had lumbar MRI that is documented in his previous note.  She went on to have a lumbar ESI with Dr. Ernestina Patches May 2022 and states that this did give good improvement up until 4 to 5 weeks ago.  No injury.  States that back pain started on the left side and has been  radiating down to the left buttock and left foot intermittently.  No right leg symptoms. Review of Systems No current complaints of cardiopulmonary GI/GU issue  Objective: Vital Signs: BP (!) 162/71 (BP Location: Left Arm, Patient Position: Sitting, Cuff Size: Normal)   Pulse 71   Ht '5\' 2"'$  (1.575 m)   Wt 175 lb 9.6 oz (79.7 kg)   BMI 32.12 kg/m   Physical Exam HENT:     Head: Normocephalic.     Nose: Nose normal.  Pulmonary:     Effort: No respiratory distress.  Musculoskeletal:     Comments: It is somewhat antalgic.  Positive left-sided lumbar paraspinal tenderness/spasm.  Positive left-sided notch tenderness.  Negative logroll bilateral hips.  Pain with left straight leg raise.  Negative on the right side.  No focal motor deficits.  Neurological:     Mental Status: She is alert and oriented to person, place, and time.     Ortho Exam  Specialty Comments:  No specialty comments available.  Imaging: No results found.   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   Rash 06/26/2019   Thyroid mass 01/31/2019   Need for influenza vaccination 01/31/2019   Lower respiratory infection 08/17/2018   Bilateral asymmetric sensorineural hearing loss 04/23/2018   Presbycusis of right ear 03/29/2018   PVD (peripheral vascular disease) ([place]) 03/29/2018   Leg pain, anterior, left 02/27/2018   Groin pain, chronic, left 02/27/2018   Seborrheic keratoses 02/27/2018   Acute maxillary sinusitis  10/26/2017   Seasonal allergic rhinitis due to pollen 10/11/2017   Acute bronchitis with COPD ([place]) 10/11/2017   Essential hypertension 10/11/2017   Pulmonary emphysema ([place]) 10/11/2017   Cough 10/11/2017   Dysfunction of right eustachian tube 10/11/2017   Ischemic cardiomyopathy 10/04/2017   Screen for colon cancer 09/11/2017   Leg cramps 09/11/2017   History of pneumonia 09/11/2017   History of abnormal mammogram 09/11/2017   Need for 23-polyvalent pneumococcal polysaccharide vaccine 09/11/2017   Estrogen deficiency 09/11/2017   Tobacco use 10/04/2016   Coronary artery disease involving native heart 04/28/2015   Hx of microdiscectomy 07/25/2014   Hyperlipidemia 01/16/2013   Chest pain 01/10/2013   Family history of heart disease 01/10/2013   Past Medical History:  Diagnosis Date   Aortic insufficiency    a. Mild AI by echo 2014.   Arthritis    CAD (coronary artery disease)    a. Anterior STEMI 01/2015 s/p overlapping DES x2 to prox LAD, DES to prox RCA in [facility].   Chronic back pain    stenosis/HNP   History of bronchitis 2014   History of colon polyps    Hyperlipidemia    Ischemic cardiomyopathy    Joint pain    Joint swelling    Pneumonia 2014   Smokers' cough ([place])    Stress headaches    Tobacco abuse     Family History  Problem Relation Age of Onset   Heart disease Mother    Heart failure Mother    Heart disease Father    Heart failure Father    CAD Other        both parents and multiple siblings with either died or had-related issues   Breast cancer Sister     Past Surgical History:  Procedure Laterality Date   ABDOMINAL HYSTERECTOMY     at age 83   BREAST BIOPSY Right    roughly 30 years ago with [facility]    cataract surgery Bilateral    CHOLECYSTECTOMY     colonscopy     ERCP     ESOPHAGOGASTRODUODENOSCOPY     LUMBAR LAMINECTOMY Right 07/25/2014   Procedure: Right L4 Hemilaminectomy, Right L4-5 Laminotomy , Decompression,  Microdisectomy;  Surgeon: Marybelle Killings, MD;  Location: [facility];  Service: Orthopedics;  Laterality: Right;   SHOULDER SURGERY Right    TONSILLECTOMY     as a child   Social History   Occupational History   Occupation: retired  Tobacco Use   Smoking status: Every Day    Packs/day: 0.50    Years: 60.00    Total pack years: 30.00    Types: Cigarettes   Smokeless tobacco: Never  Vaping Use  Vaping Use: Never used  Substance and Sexual Activity   Alcohol use: No   Drug use: No   Sexual activity: Yes    Birth control/protection: Surgical

## 2021-11-26 DIAGNOSIS — R079 Chest pain, unspecified: Secondary | ICD-10-CM | POA: Diagnosis not present

## 2021-11-26 DIAGNOSIS — I10 Essential (primary) hypertension: Secondary | ICD-10-CM | POA: Diagnosis not present

## 2021-11-26 DIAGNOSIS — R0789 Other chest pain: Secondary | ICD-10-CM | POA: Diagnosis not present

## 2021-11-27 ENCOUNTER — Emergency Department (HOSPITAL_COMMUNITY): Payer: Medicare Other

## 2021-11-27 ENCOUNTER — Encounter (HOSPITAL_COMMUNITY): Payer: Self-pay

## 2021-11-27 ENCOUNTER — Emergency Department (HOSPITAL_COMMUNITY)
Admission: EM | Admit: 2021-11-27 | Discharge: 2021-11-27 | Disposition: A | Discharge status: Home / Self Care | Payer: Medicare Other | Attending: Emergency Medicine | Admitting: Emergency Medicine

## 2021-11-27 ENCOUNTER — Other Ambulatory Visit: Payer: Self-pay

## 2021-11-27 DIAGNOSIS — Z7982 Long term (current) use of aspirin: Secondary | ICD-10-CM | POA: Diagnosis not present

## 2021-11-27 DIAGNOSIS — K5790 Diverticulosis of intestine, part unspecified, without perforation or abscess without bleeding: Secondary | ICD-10-CM | POA: Diagnosis not present

## 2021-11-27 DIAGNOSIS — I270 Primary pulmonary hypertension: Secondary | ICD-10-CM | POA: Diagnosis not present

## 2021-11-27 DIAGNOSIS — D500 Iron deficiency anemia secondary to blood loss (chronic): Secondary | ICD-10-CM | POA: Diagnosis not present

## 2021-11-27 DIAGNOSIS — I771 Stricture of artery: Secondary | ICD-10-CM | POA: Diagnosis not present

## 2021-11-27 DIAGNOSIS — I3139 Other pericardial effusion (noninflammatory): Secondary | ICD-10-CM | POA: Diagnosis not present

## 2021-11-27 DIAGNOSIS — Z955 Presence of coronary angioplasty implant and graft: Secondary | ICD-10-CM | POA: Diagnosis not present

## 2021-11-27 DIAGNOSIS — Z940 Kidney transplant status: Secondary | ICD-10-CM | POA: Diagnosis not present

## 2021-11-27 DIAGNOSIS — R079 Chest pain, unspecified: Secondary | ICD-10-CM

## 2021-11-27 DIAGNOSIS — R072 Precordial pain: Secondary | ICD-10-CM | POA: Insufficient documentation

## 2021-11-27 DIAGNOSIS — Z79899 Other long term (current) drug therapy: Secondary | ICD-10-CM | POA: Diagnosis not present

## 2021-11-27 DIAGNOSIS — R778 Other specified abnormalities of plasma proteins: Secondary | ICD-10-CM

## 2021-11-27 DIAGNOSIS — I739 Peripheral vascular disease, unspecified: Secondary | ICD-10-CM | POA: Diagnosis not present

## 2021-11-27 DIAGNOSIS — I130 Hypertensive heart and chronic kidney disease with heart failure and stage 1 through stage 4 chronic kidney disease, or unspecified chronic kidney disease: Secondary | ICD-10-CM | POA: Insufficient documentation

## 2021-11-27 DIAGNOSIS — N189 Chronic kidney disease, unspecified: Secondary | ICD-10-CM | POA: Diagnosis not present

## 2021-11-27 DIAGNOSIS — I509 Heart failure, unspecified: Secondary | ICD-10-CM | POA: Insufficient documentation

## 2021-11-27 DIAGNOSIS — I2510 Atherosclerotic heart disease of native coronary artery without angina pectoris: Secondary | ICD-10-CM | POA: Insufficient documentation

## 2021-11-27 DIAGNOSIS — N281 Cyst of kidney, acquired: Secondary | ICD-10-CM | POA: Diagnosis not present

## 2021-11-27 DIAGNOSIS — R0602 Shortness of breath: Secondary | ICD-10-CM | POA: Diagnosis not present

## 2021-11-27 DIAGNOSIS — Z7902 Long term (current) use of antithrombotics/antiplatelets: Secondary | ICD-10-CM | POA: Insufficient documentation

## 2021-11-27 DIAGNOSIS — D649 Anemia, unspecified: Secondary | ICD-10-CM

## 2021-11-27 DIAGNOSIS — M47816 Spondylosis without myelopathy or radiculopathy, lumbar region: Secondary | ICD-10-CM | POA: Diagnosis not present

## 2021-11-27 DIAGNOSIS — R0789 Other chest pain: Secondary | ICD-10-CM | POA: Diagnosis not present

## 2021-11-27 LAB — CBC
HCT: 37.7 % (ref 36.0–46.0)
Hemoglobin: 11.9 g/dL — ABNORMAL LOW (ref 12.0–15.0)
MCH: 27.9 pg (ref 26.0–34.0)
MCHC: 31.6 g/dL (ref 30.0–36.0)
MCV: 88.5 fL (ref 80.0–100.0)
Platelets: 236 10*3/uL (ref 150–400)
RBC: 4.26 MIL/uL (ref 3.87–5.11)
RDW: 15.6 % — ABNORMAL HIGH (ref 11.5–15.5)
WBC: 15.4 10*3/uL — ABNORMAL HIGH (ref 4.0–10.5)
nRBC: 0 % (ref 0.0–0.2)

## 2021-11-27 LAB — PROTIME-INR
INR: 1 (ref 0.8–1.2)
Prothrombin Time: 13.4 seconds (ref 11.4–15.2)

## 2021-11-27 LAB — TROPONIN I (HIGH SENSITIVITY)
Troponin I (High Sensitivity): 22 ng/L — ABNORMAL HIGH (ref <18)
Troponin I (High Sensitivity): 24 ng/L — ABNORMAL HIGH (ref <18)
Troponin I (High Sensitivity): 23 ng/L — ABNORMAL HIGH (ref <18)

## 2021-11-27 LAB — BASIC METABOLIC PANEL
Anion gap: 9 (ref 5–15)
BUN: 19 mg/dL (ref 8–23)
CO2: 21 mmol/L — ABNORMAL LOW (ref 22–32)
Calcium: 8.8 mg/dL — ABNORMAL LOW (ref 8.9–10.3)
Chloride: 107 mmol/L (ref 98–111)
Creatinine, Ser: 0.95 mg/dL (ref 0.44–1.00)
GFR, Estimated: >60 mL/min (ref >60)
Glucose, Bld: 119 mg/dL — ABNORMAL HIGH (ref 70–99)
Potassium: 3.6 mmol/L (ref 3.5–5.1)
Sodium: 137 mmol/L (ref 135–145)

## 2021-11-27 MED ORDER — ONDANSETRON HCL 4 MG/2ML IJ SOLN
4.0000 mg | Freq: Once | INTRAMUSCULAR | Status: AC
  Administered 2021-11-27: 4 mg via INTRAVENOUS
  Filled 2021-11-27: qty 2

## 2021-11-27 MED ORDER — IOHEXOL 350 MG/ML SOLN
100.0000 mL | Freq: Once | INTRAVENOUS | Status: AC | PRN
  Administered 2021-11-27: 100 mL via INTRAVENOUS

## 2021-11-27 MED ORDER — MORPHINE SULFATE (PF) 4 MG/ML IV SOLN: 4.0000 mg | INTRAVENOUS | Status: DC | PRN

## 2021-11-27 MED ORDER — ASPIRIN 81 MG PO CHEW
324.0000 mg | CHEWABLE_TABLET | Freq: Once | ORAL | Status: DC
  Filled 2021-11-27: qty 4

## 2021-11-27 NOTE — ED Notes (Signed)
Patient verbalizes understanding of d/c instructions. Opportunities for questions and answers were provided. Pt d/c from ED and wheeled to lobby with family.  

## 2021-11-30 ENCOUNTER — Ambulatory Visit (INDEPENDENT_AMBULATORY_CARE_PROVIDER_SITE_OTHER): Payer: Medicare Other | Admitting: Family Medicine

## 2021-11-30 ENCOUNTER — Encounter: Payer: Self-pay | Admitting: Family Medicine

## 2021-11-30 VITALS — BP 128/60 | HR 74 | Temp 97.0°F | Ht 62.0 in | Wt 175.6 lb

## 2021-11-30 DIAGNOSIS — M79605 Pain in left leg: Secondary | ICD-10-CM | POA: Diagnosis not present

## 2021-11-30 DIAGNOSIS — I739 Peripheral vascular disease, unspecified: Secondary | ICD-10-CM

## 2021-11-30 DIAGNOSIS — I255 Ischemic cardiomyopathy: Secondary | ICD-10-CM | POA: Diagnosis not present

## 2021-11-30 DIAGNOSIS — G8929 Other chronic pain: Secondary | ICD-10-CM | POA: Diagnosis not present

## 2021-11-30 DIAGNOSIS — I2510 Atherosclerotic heart disease of native coronary artery without angina pectoris: Secondary | ICD-10-CM | POA: Diagnosis not present

## 2021-11-30 DIAGNOSIS — R1032 Left lower quadrant pain: Secondary | ICD-10-CM | POA: Diagnosis not present

## 2021-11-30 NOTE — Progress Notes (Signed)
Cardiology Clinic Note   Patient Name: Terri [name] Date of Encounter: 12/02/2021  Primary Care Provider:  Libby Maw, MD Primary Cardiologist:  Quay Burow, MD  Patient Profile    76 year old female with known history of coronary artery disease status post anterior STEMI 01/2015 status post drug-eluting stent x2 to the LAD and drug-eluting stent to proximal and mid RCA.  She apparently was seen at a hospital in [facility] after she presented with palpitation shortness of breath and complaints of indigestion.    At that time she had a left heart catheterization revealing an occluded mid to proximal RCA status post PCI.  The RCA was found to be 99% stenosed.  Echo revealed an EF of 40% with mild MR, no aortic stenosis or AI.  She remained on dual antiplatelet therapy when seen last on 09/17/2021 by Dr. Phineas Inches.  Other history includes hypertension, hyperlipidemia, chronic diastolic heart failure, chronic kidney disease, and COPD.  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   Past Medical History    Past Medical History:  Diagnosis Date   Aortic insufficiency    a. Mild AI by echo 2014.   Arthritis    CAD (coronary artery disease)    a. Anterior STEMI 01/2015 s/p overlapping DES x2 to prox LAD, DES to prox RCA in [facility].   Chronic back pain    stenosis/HNP   History of bronchitis 2014   History of colon polyps    Hyperlipidemia    Ischemic cardiomyopathy    Joint pain    Joint swelling    Pneumonia 2014   Smokers' cough ([place])    Stress headaches    Tobacco abuse    Past Surgical History:  Procedure Laterality Date   ABDOMINAL HYSTERECTOMY     at age 80   BREAST BIOPSY Right    roughly 30 years ago with [facility]    cataract  surgery Bilateral    CHOLECYSTECTOMY     colonscopy     ERCP     ESOPHAGOGASTRODUODENOSCOPY     LUMBAR LAMINECTOMY Right 07/25/2014   Procedure: Right L4 Hemilaminectomy, Right L4-5 Laminotomy , Decompression, Microdisectomy;  Surgeon: Marybelle Killings, MD;  Location: [facility];  Service: Orthopedics;  Laterality: Right;   SHOULDER SURGERY Right    TONSILLECTOMY     as a child    Allergies  Allergies  Allergen Reactions   Erythromycin     nauseated    History of Present Illness    Terri [name] comes today for ongoing assessment and management of coronary artery disease, hypertension, and chronic diastolic heart failure.  Recently seen in the emergency room on 11/27/2021 for recurrent chest discomfort, dyspnea, and diaphoresis.  She reported that her symptoms were so much of the symptoms she had prior to her MI in March 2023, nitroglycerin was unhelpful.  She was ruled out for ACS, PE, aneurysm, and CHF.  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  She was referred to vascular surgery and to follow-up with cardiology.  She comes today with complaints of spasms in her legs, and her hands.  Chronic shortness of breath with coughing.  Stating that she feels "  tired all the time" but continues to be able to complete her ADLs.  She also is requesting referral to vein and vascular as per ED physician recommendation.  She is also requesting to be transferred to Dr. Phineas Inches is primary cardiologist.  Home Medications    Current Outpatient Medications  Medication Sig Dispense Refill   albuterol (VENTOLIN HFA) 108 (90 Base) MCG/ACT inhaler Inhale 1-2 puffs into the lungs every 6 (six) hours as needed for wheezing or shortness of breath. And or cough. 1 each 0   atorvastatin (LIPITOR) 80 MG tablet Take 1 tablet (80 mg total) by mouth daily. 90 tablet 3    carvedilol (COREG) 6.25 MG tablet Take 1 tablet (6.25 mg total) by mouth 2 (two) times daily with a meal. TAKE 1 TABLET BY MOUTH TWICE DAILY 60 tablet 6   clopidogrel (PLAVIX) 75 MG tablet Take 1 tablet (75 mg total) by mouth daily. 90 tablet 3   [facility] ASPIRIN 81 MG EC tablet Take 81 mg by mouth daily.  0   lisinopril (ZESTRIL) 40 MG tablet Take 1 tablet (40 mg total) by mouth daily. 30 tablet 6   methocarbamol (ROBAXIN) 500 MG tablet Take 1 tablet (500 mg total) by mouth every 8 (eight) hours as needed for muscle spasms. 40 tablet 0   nitroGLYCERIN (NITROSTAT) 0.4 MG SL tablet Place 1 tablet (0.4 mg total) under the tongue every 5 (five) minutes as needed for chest pain. 25 tablet 0   pantoprazole (PROTONIX) 40 MG tablet Take 1 tablet (40 mg total) by mouth daily. 90 tablet 0   potassium chloride (KLOR-CON) 10 MEQ tablet Take 1 tablet (10 mEq total) by mouth daily. 90 tablet 3   furosemide (LASIX) 40 MG tablet Take 1 tablet (40 mg total) by mouth daily. 90 tablet 3   No current facility-administered medications for this visit.     Family History    Family History  Problem Relation Age of Onset   Heart disease Mother    Heart failure Mother    Heart disease Father    Heart failure Father    CAD Other        both parents and multiple siblings with either died or had-related issues   Breast cancer Sister    She indicated that her mother is deceased. She indicated that her father is deceased. She indicated that the status of her sister is unknown. She indicated that the status of her other is unknown.  Social History    Social History   Socioeconomic History   Marital status: Married    Spouse name: Dakari Stabler   Number of children: Not on file   Years of education: Not on file   Highest education level: Not on file  Occupational History   Occupation: retired  Tobacco Use   Smoking status: Every Day    Packs/day: 0.50    Years: 60.00    Total pack years: 30.00    Types:  Cigarettes   Smokeless tobacco: Never  Vaping Use   Vaping Use: Never used  Substance and Sexual Activity   Alcohol use: No   Drug use: No   Sexual activity: Yes    Birth control/protection: Surgical  Other Topics Concern   Not on file  Social History Narrative   Not on file   Social Determinants of Health   Financial Resource Strain: Low Risk  (10/13/2021)   Overall Financial Resource Strain (CARDIA)    Difficulty of Paying Living Expenses:  Not hard at all  Food Insecurity: No Food Insecurity (10/13/2021)   Hunger Vital Sign    Worried About Running Out of Food in the Last Year: Never true    Ran Out of Food in the Last Year: Never true  Transportation Needs: No Transportation Needs (10/13/2021)   PRAPARE - Hydrologist (Medical): No    Lack of Transportation (Non-Medical): No  Physical Activity: Inactive (10/13/2021)   Exercise Vital Sign    Days of Exercise per Week: 0 days    Minutes of Exercise per Session: 0 min  Stress: No Stress Concern Present (10/13/2021)   [place]    Feeling of Stress : Not at all  Social Connections: Moderately Integrated (10/13/2021)   Social Connection and Isolation Panel [NHANES]    Frequency of Communication with Friends and Family: Three times a week    Frequency of Social Gatherings with Friends and Family: Three times a week    Attends Religious Services: More than 4 times per year    Active Member of Clubs or Organizations: No    Attends Archivist Meetings: Never    Marital Status: Married  Human resources officer Violence: Not At Risk (10/13/2021)   Humiliation, Afraid, Rape, and Kick questionnaire    Fear of Current or Ex-Partner: No    Emotionally Abused: No    Physically Abused: No    Sexually Abused: No     Review of Systems    General:  No chills, fever, night sweats or weight changes.  Cardiovascular:  No chest pain, dyspnea on  exertion, edema, orthopnea, palpitations, paroxysmal nocturnal dyspnea. Dermatological: No rash, lesions/masses Respiratory: No cough, dyspnea Urologic: No hematuria, dysuria Abdominal:   No nausea, vomiting, diarrhea, bright red blood per rectum, melena, or hematemesis Neurologic:  No visual changes, wkns, changes in mental status. All other systems reviewed and are otherwise negative except as noted above.     Physical Exam    VS:  BP 132/72   Pulse 73   Ht 5' 2.5" (1.588 m)   Wt 175 lb 12.8 oz (79.7 kg)   SpO2 93%   BMI 31.64 kg/m  , BMI Body mass index is 31.64 kg/m.     GEN: Well nourished, well developed, in no acute distress. HEENT: normal. Neck: Supple, no JVD, carotid bruits, or masses. Cardiac: RRR, no murmurs, rubs, or gallops. No clubbing, cyanosis, edema.  Radials/DP/PT 2+ and equal bilaterally.  Respiratory:  Respirations regular and unlabored, inspiratory and expiratory wheezes not cleared with cough  GI: Soft, nontender, nondistended, BS + x 4. MS: no deformity or atrophy. Skin: warm and dry, no rash. Neuro:  Strength and sensation are intact. Psych: Normal affect.  Accessory Clinical Findings    ECG personally reviewed by me today-normal sinus rhythm with left anterior fascicular block, anterior lateral Q waves.  Heart rate of 73 bpm.- No acute changes  Lab Results  Component Value Date   WBC 15.4 (H) 11/27/2021   HGB 11.9 (L) 11/27/2021   HCT 37.7 11/27/2021   MCV 88.5 11/27/2021   PLT 236 11/27/2021   Lab Results  Component Value Date   CREATININE 0.95 11/27/2021   BUN 19 11/27/2021   NA 137 11/27/2021   K 3.6 11/27/2021   CL 107 11/27/2021   CO2 21 (L) 11/27/2021   Lab Results  Component Value Date   ALT 14 07/29/2021   AST 12 07/29/2021  ALKPHOS 105 07/29/2021   BILITOT 0.4 07/29/2021   Lab Results  Component Value Date   CHOL 112 07/29/2021   HDL 39.10 07/29/2021   LDLCALC 44 07/29/2021   TRIG 148.0 07/29/2021   CHOLHDL 3  07/29/2021    Lab Results  Component Value Date   HGBA1C 6.0 02/27/2015    Review of Prior Studies: CT of the Chest , Abdomen, and Pelvis 11/27/2021 IMPRESSION: 1. Aortic atherosclerosis without evidence of aneurysm or dissection. 2. Occlusion of the proximal celiac artery with reconstitution via pancreatic duodenal gastroduodenal arteries, likely present in 2019. 3. High-grade stenosis involving the internal iliac artery on the left. 4. Cardiomegaly with small pericardial effusion and multi-vessel coronary artery calcifications. 5. Distended pulmonary trunk suggesting underlying pulmonary artery hypertension. 6. Atelectasis or infiltrate at the lung bases. 7. Remaining incidental findings as described above.  NM Stress Test 08/10/2021 Fixed apical to basal anterior/anteroseptal and apex perfusion defect with hypokinesis in this area consistent with infarct Moderate systolic dysfunction (EF 38%) High risk study due to systolic dysfunction.  No ischemia   Echocardiogram 0/11/2021  1. Poor quality images even with definity septuma and mid/basal inferior  wall hypokinetic. Left ventricular ejection fraction, by estimation, is 40  to 45%. The left ventricle has mildly decreased function. The left  ventricle demonstrates regional wall  motion abnormalities (see scoring diagram/findings for description). The  left ventricular internal cavity size was mildly dilated  Assessment & Plan   1.  Coronary artery disease: History of STEMI 01/2015 with drug-eluting stent x2 to the LAD and drug-eluting stent to the proximal mid RCA in the setting of a 99% stenosis.  She was seen in the ED with complaints of recurrent chest pain similar to those prior to her MI.  She had associated diaphoresis dizziness and shortness of breath.  She was ruled out for ACS.  Most recent nuclear medicine stress test on 08/10/2021 revealed EF of 35%, and evidence of fixed apical to basal anterior septal and apex  perfusion deficit.  No new areas of ischemia.  With chronic cramping in her legs and hands which have been going on for several months, I am concerned that she may also be having coronary artery spasming.  Consider adding low-dose isosorbide mononitrate to assist with this.  In the interim I have reviewed her labs and seen that her potassium was 3.6.  Would like to see this greater than 4.0 patient with CAD.  I will begin her on magnesium glycinate 200 mg daily and potassium 10 mEq daily.  She will have a follow-up BMET and magnesium on July 16, prior to her follow-up with Dr. Harl Bowie on July 17.  Hopefully this will be helpful with her overall symptoms.  Can consider adding nitrates if symptoms are persistent.  2.  Peripheral arterial disease: New, high-grade stenosis involving the internal iliac artery on the left, with occlusion of the proximal celiac artery and reconstitution via pancreaticoduodenal and gastroduodenal arteries, which was found to be present in 2019).  She is requesting referral to vein and vascular as recommended by emergency room.  She would prefer to see them and transfer service from Dr. Gwenlyn Found to Dr. Phineas Inches.  I will inform them.  3.  Hypertension: Blood pressures currently well controlled on medication regimen.  She also remains on furosemide.  Low-dose potassium with magnesium is provided.  May need to consider spironolactone for potassium sparing should potassium level remain less than optimal for patient with CAD  4.  Emphysema:  Bilateral wheezes are noted.  She is not being followed by pulmonologist.  I will refer her to pulmonology for evaluation and optimization of medications to assist with her chronic dyspnea and emphysema symptoms.  5.  Hyperlipidemia: Remains on statin therapy with goal of LDL less than 70.  Would repeat lipids and LFTs in 3 months on follow-up.  Uncertain if cramping in legs and hands is related to statin therapy.  We will review on follow-up if her  symptoms have been relieved with addition of magnesium glycinate and potassium replacement.  Consider changing statin if no improvement in symptoms.  Current medicines are reviewed at length with the patient today.  I have spent  45 min's  dedicated to the care of this patient on the date of this encounter to include pre-visit review of records, assessment, management and diagnostic testing,with shared decision making. Signed, Phill Myron. West Pugh, ANP, AACC   12/02/2021 12:51 PM    [facility] [place] [address] Office [phone] Fax [phone]  Notice: This dictation was prepared with Dragon dictation along with smaller phrase technology. Any transcriptional errors that result from this process are unintentional and may not be corrected upon review.

## 2021-12-02 ENCOUNTER — Ambulatory Visit (INDEPENDENT_AMBULATORY_CARE_PROVIDER_SITE_OTHER): Payer: Medicare Other | Admitting: Adult Health

## 2021-12-02 ENCOUNTER — Encounter: Payer: Self-pay | Admitting: Adult Health

## 2021-12-02 VITALS — BP 132/72 | HR 73 | Ht 62.5 in | Wt 175.8 lb

## 2021-12-02 DIAGNOSIS — I2510 Atherosclerotic heart disease of native coronary artery without angina pectoris: Secondary | ICD-10-CM

## 2021-12-02 DIAGNOSIS — M79605 Pain in left leg: Secondary | ICD-10-CM | POA: Diagnosis not present

## 2021-12-02 DIAGNOSIS — Z79899 Other long term (current) drug therapy: Secondary | ICD-10-CM | POA: Diagnosis not present

## 2021-12-02 DIAGNOSIS — J438 Other emphysema: Secondary | ICD-10-CM | POA: Diagnosis not present

## 2021-12-02 DIAGNOSIS — I255 Ischemic cardiomyopathy: Secondary | ICD-10-CM

## 2021-12-02 DIAGNOSIS — I5022 Chronic systolic (congestive) heart failure: Secondary | ICD-10-CM | POA: Diagnosis not present

## 2021-12-02 MED ORDER — POTASSIUM CHLORIDE ER 10 MEQ PO TBCR: 10.0000 meq | EXTENDED_RELEASE_TABLET | Freq: Every day | ORAL | 3 refills | Status: DC

## 2021-12-02 NOTE — Addendum Note (Signed)
Addended by: Juventino Slovak on: 12/02/2021 05:10 PM   Modules accepted: Orders

## 2021-12-02 NOTE — Patient Instructions (Addendum)
Medication Instructions:  Your physician has recommended you make the following change in your medication:  1.Start over the counter magnesium glycinate 200 mg, take 400 mg on the first day then 200 mg daily therafter 2.Start potassium 10 meq daily. We have sent this to your pharmacy *If you need a refill on your cardiac medications before your next appointment, please call your pharmacy*   Lab Work: BMET and [place] on 12/20/2021 If you have labs (blood work) drawn today and your tests are completely normal, you will receive your results only by: [place] (if you have MyChart) OR A paper copy in the mail If you have any lab test that is abnormal or we need to change your treatment, we will call you to review the results.  Follow-Up: At [facility], you and your health needs are our priority.  As part of our continuing mission to provide you with exceptional heart care, we have created designated Provider Care Teams.  These Care Teams include your primary Cardiologist (physician) and Advanced Practice Providers (APPs -  Physician Assistants and Nurse Practitioners) who all work together to provide you with the care you need, when you need it.  We recommend signing up for the patient portal called "MyChart".  Sign up information is provided on this After Visit Summary.  MyChart is used to connect with patients for Virtual Visits (Telemedicine).  Patients are able to view lab/test results, encounter notes, upcoming appointments, etc.  Non-urgent messages can be sent to your provider as well.   To learn more about what you can do with MyChart, go to [email or web address].    Your next appointment:   12/21/21 at 10:20 AM    The format for your next appointment:   In Person  Provider:   Dr Phineas Inches  Other Instructions You have been referred to pulmonology You have been referred to vain and vascular  Important Information About [place]

## 2021-12-08 ENCOUNTER — Ambulatory Visit (INDEPENDENT_AMBULATORY_CARE_PROVIDER_SITE_OTHER): Payer: Medicare Other | Admitting: Vascular Surgery

## 2021-12-08 ENCOUNTER — Ambulatory Visit: Payer: Medicare Other | Admitting: Orthopaedic Surgery

## 2021-12-08 ENCOUNTER — Encounter: Payer: Self-pay | Admitting: Vascular Surgery

## 2021-12-08 VITALS — BP 165/81 | HR 79 | Temp 97.9°F | Resp 20 | Ht 62.5 in | Wt 171.0 lb

## 2021-12-08 DIAGNOSIS — I708 Atherosclerosis of other arteries: Secondary | ICD-10-CM | POA: Diagnosis not present

## 2021-12-08 DIAGNOSIS — I771 Stricture of artery: Secondary | ICD-10-CM | POA: Diagnosis not present

## 2021-12-08 NOTE — Progress Notes (Signed)
Patient ID: Terri [name], female   DOB: 12-02-1945, 76 y.o.   MRN: [id number]  Reason for Consult: New Patient (Initial Visit)   Referred by Libby Maw,*  Subjective:     HPI:  Terri [name] is a 76 y.o. female with history of coronary artery disease but without previous history of vascular disease.  She has multiple coronary artery stents most recently placed at [facility].  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  She does not have any abdominal pain when eating.  She has lost about 2 to 3 pounds recently and has fatigue related to her recent medical events.  She has lower abdominal pain radiating down the left leg.  She denies any tissue loss or ulceration.  No personal family history of aneurysm disease or stroke.  Past Medical History:  Diagnosis Date   Aortic insufficiency    a. Mild AI by echo 2014.   Arthritis    CAD (coronary artery disease)    a. Anterior STEMI 01/2015 s/p overlapping DES x2 to prox LAD, DES to prox RCA in [facility].   CHF (congestive heart failure) (HCC)    Chronic back pain    stenosis/HNP   History of bronchitis 06/06/2012   History of colon polyps    Hyperlipidemia    Ischemic cardiomyopathy    Joint pain    Joint swelling    Myocardial infarction [facility])    Pneumonia 06/06/2012   Smokers' cough (HCC)    Stress headaches    Tobacco abuse    Family History  Problem Relation Age of Onset   Heart disease Mother    Heart failure Mother    Heart disease Father    Heart failure Father    CAD Other        both parents and multiple siblings with either died or had-related issues   Breast cancer Sister    Past Surgical History:  Procedure Laterality Date   ABDOMINAL HYSTERECTOMY     at age 76   BREAST BIOPSY Right    roughly 30 years ago with [facility]    cataract surgery Bilateral    CHOLECYSTECTOMY      colonscopy     ERCP     ESOPHAGOGASTRODUODENOSCOPY     LUMBAR LAMINECTOMY Right 07/25/2014   Procedure: Right L4 Hemilaminectomy, Right L4-5 Laminotomy , Decompression, Microdisectomy;  Surgeon: Marybelle Killings, MD;  Location: [place];  Service: Orthopedics;  Laterality: Right;   SHOULDER SURGERY Right    TONSILLECTOMY     as a child    Short Social History:  Social History   Tobacco Use   Smoking status: Every Day    Packs/day: 0.50    Years: 60.00    Total pack years: 30.00    Types: Cigarettes   Smokeless tobacco: Never  Substance Use Topics   Alcohol use: No    Allergies  Allergen Reactions   Erythromycin     nauseated    Current Outpatient Medications  Medication Sig Dispense Refill   albuterol (VENTOLIN HFA) 108 (90 Base) MCG/ACT inhaler Inhale 1-2 puffs into the lungs every 6 (six) hours as needed for wheezing or shortness of breath. And or cough. 1 each 0   atorvastatin (LIPITOR) 80 MG tablet Take 1 tablet (80 mg total) by mouth daily. 90 tablet 3   carvedilol (COREG) 6.25 MG tablet Take 1  tablet (6.25 mg total) by mouth 2 (two) times daily with a meal. TAKE 1 TABLET BY MOUTH TWICE DAILY 60 tablet 6   clopidogrel (PLAVIX) 75 MG tablet Take 1 tablet (75 mg total) by mouth daily. 90 tablet 3   [employer] ASPIRIN 81 MG EC tablet Take 81 mg by mouth daily.  0   lisinopril (ZESTRIL) 40 MG tablet Take 1 tablet (40 mg total) by mouth daily. 30 tablet 6   methocarbamol (ROBAXIN) 500 MG tablet Take 1 tablet (500 mg total) by mouth every 8 (eight) hours as needed for muscle spasms. 40 tablet 0   nitroGLYCERIN (NITROSTAT) 0.4 MG SL tablet Place 1 tablet (0.4 mg total) under the tongue every 5 (five) minutes as needed for chest pain. 25 tablet 0   pantoprazole (PROTONIX) 40 MG tablet Take 1 tablet (40 mg total) by mouth daily. 90 tablet 0   potassium chloride (KLOR-CON) 10 MEQ tablet Take 1 tablet (10 mEq total) by mouth daily. 90 tablet 3   furosemide (LASIX) 40 MG tablet Take 1 tablet (40  mg total) by mouth daily. 90 tablet 3   No current facility-administered medications for this visit.    Review of Systems  Constitutional: Positive for fatigue.  HENT: HENT negative.  Eyes: Eyes negative.  Cardiovascular: Cardiovascular negative.  GI: Positive for abdominal pain.  Musculoskeletal: Positive for back pain, gait problem and leg pain.  Hematologic: Hematologic/lymphatic negative.  Psychiatric: Psychiatric negative.        Objective:  Objective   Vitals:   12/08/21 1607  BP: (!) 165/81  Pulse: 79  Resp: 20  Temp: 97.9 F (36.6 C)  SpO2: 95%  Weight: 171 lb (77.6 kg)  Height: 5' 2.5" (1.588 m)   Body mass index is 30.78 kg/m.  Physical Exam Constitutional:      Appearance: Normal appearance.  HENT:     Head: Normocephalic.     Nose: Nose normal.  Eyes:     Pupils: Pupils are equal, round, and reactive to light.  Neck:     Vascular: No carotid bruit.  Cardiovascular:     Rate and Rhythm: Normal rate.     Pulses:          Radial pulses are 2+ on the right side and 2+ on the left side.       Popliteal pulses are 2+ on the right side and 2+ on the left side.       Posterior tibial pulses are 2+ on the right side and 2+ on the left side.  Pulmonary:     Effort: Pulmonary effort is normal.  Abdominal:     General: Abdomen is flat.     Palpations: Abdomen is soft.  Musculoskeletal:        General: Normal range of motion.     Right lower leg: No edema.     Left lower leg: No edema.  Skin:    General: Skin is warm and dry.     Capillary Refill: Capillary refill takes less than 2 seconds.  Neurological:     General: No focal deficit present.     Mental Status: She is alert.  Psychiatric:        Mood and Affect: Mood normal.        Behavior: Behavior normal.        Thought Content: Thought content normal.     Data: CTA IMPRESSION: 1. Aortic atherosclerosis without evidence of aneurysm or dissection. 2. Occlusion of the proximal celiac  artery  with reconstitution via pancreatic duodenal gastroduodenal arteries, likely present in 2019. 3. High-grade stenosis involving the internal iliac artery on the left. 4. Cardiomegaly with small pericardial effusion and multi-vessel coronary artery calcifications. 5. Distended pulmonary trunk suggesting underlying pulmonary artery hypertension. 6. Atelectasis or infiltrate at the lung bases. 7. Remaining incidental findings as described above.     Assessment/Plan:     76 year old female here for evaluation to leg artery occlusion or stenosis and left hypogastric artery stenosis or occlusion.  Both of these appear stable from CT scan 2019.  I have recommended smoking cessation and continuing antiplatelet and statin therapy.  She can see me on an as-needed basis.     Waynetta Sandy MD [facility]

## 2021-12-17 ENCOUNTER — Ambulatory Visit: Payer: Medicare Other | Admitting: Cardiovascular Disease

## 2021-12-20 DIAGNOSIS — J438 Other emphysema: Secondary | ICD-10-CM | POA: Diagnosis not present

## 2021-12-20 DIAGNOSIS — I255 Ischemic cardiomyopathy: Secondary | ICD-10-CM | POA: Diagnosis not present

## 2021-12-20 DIAGNOSIS — I5022 Chronic systolic (congestive) heart failure: Secondary | ICD-10-CM | POA: Diagnosis not present

## 2021-12-20 DIAGNOSIS — M79605 Pain in left leg: Secondary | ICD-10-CM | POA: Diagnosis not present

## 2021-12-20 DIAGNOSIS — Z79899 Other long term (current) drug therapy: Secondary | ICD-10-CM | POA: Diagnosis not present

## 2021-12-20 DIAGNOSIS — I2510 Atherosclerotic heart disease of native coronary artery without angina pectoris: Secondary | ICD-10-CM | POA: Diagnosis not present

## 2021-12-20 LAB — BASIC METABOLIC PANEL
BUN/Creatinine Ratio: 16 (ref 12–28)
BUN: 14 mg/dL (ref 8–27)
CO2: 22 mmol/L (ref 20–29)
Calcium: 8.9 mg/dL (ref 8.7–10.3)
Chloride: 97 mmol/L (ref 96–106)
Creatinine, Ser: 0.89 mg/dL (ref 0.57–1.00)
Glucose: 94 mg/dL (ref 70–99)
Potassium: 4.3 mmol/L (ref 3.5–5.2)
Sodium: 132 mmol/L — ABNORMAL LOW (ref 134–144)
eGFR: 68 mL/min/{1.73_m2} (ref >59)

## 2021-12-20 LAB — MAGNESIUM: Magnesium: 2 mg/dL (ref 1.6–2.3)

## 2021-12-21 ENCOUNTER — Encounter: Payer: Self-pay | Admitting: Internal Medicine

## 2021-12-21 ENCOUNTER — Ambulatory Visit (INDEPENDENT_AMBULATORY_CARE_PROVIDER_SITE_OTHER): Payer: Medicare Other | Admitting: Internal Medicine

## 2021-12-21 VITALS — BP 130/80 | HR 85 | Ht 62.5 in | Wt 171.6 lb

## 2021-12-21 DIAGNOSIS — R079 Chest pain, unspecified: Secondary | ICD-10-CM | POA: Diagnosis not present

## 2021-12-21 MED ORDER — FUROSEMIDE 40 MG PO TABS: 60.0000 mg | ORAL_TABLET | Freq: Every day | ORAL | 3 refills | Status: DC

## 2021-12-21 MED ORDER — EMPAGLIFLOZIN 10 MG PO TABS: 10.0000 mg | ORAL_TABLET | Freq: Every day | ORAL | 3 refills | Status: DC

## 2021-12-21 NOTE — Progress Notes (Addendum)
Cardiology Office Note:    Date:  12/21/2021   ID:  Terri [name], DOB 03/06/1946, MRN [id number]  PCP:  Libby Maw, MD   [place] Cardiologist:  Quay Burow, MD     Referring MD: Libby Maw   No chief complaint on file. Follow up  History of Present Illness:    Terri [name] is a 76 y.o. female with a hx of smoking, CAD s/p anterior stemi 01/2015 DESx2 prox LAD, recent DES to prox and mid  RCA  She brings in DC summary from [facility]. She presented with palpitations and SOB to the hospital there. She also reported indigestion.. She underwent LHC, PCI of an occluded mid and prox RCA (written on  the back of her DC summary). Noted to be 99% on  a voicemail from the internationalist. Echo showed EF 40%, mild MR, no aortic stenosis or AI, no pericardial effusion. Her husband noted that her blood pressure dropped suddenly.  She had CPR. No shockable rhythm. This was March 24 , 2023.  She feels much better. She's on DAPT. Her blood pressure is well controlled. Still smoking. No PND. No orthopnea. No Le edema.   Interval Hx: She went to the ED for  mid-sternal chest pain 6/24. She was given nitro. She said her arm hurt and she was diaphoretic. Her chest pain resolved. EKG did not show any ischemic changes. Her troponin was very minimal.  Today, she notes no energy. She sleeps a lot. She is fatigued with minimal activity.  She notes both sides of her chest aches. She denies orthopnea, PND. No LE edema. She has significant shortness of breath with minimal activity.  She feels congested. She is coughing up clear phlegm which is chronic. She states these symptoms are the same ones she had prior to her PCI in March and have persisted.    Cardiology Studies Myoview-08/10/2021-large defect with moderate intensity in the apical to basal anterior, anteroseptal and apex that's fixed  06/11/2021-TTE- EF 40-45%, mildly reduced. 04/28/2021- EF 50-55, RV is normal,  mild-mod MR, AI mild to moderate  Past Medical History:  Diagnosis Date   Aortic insufficiency    a. Mild AI by echo 2014.   Arthritis    CAD (coronary artery disease)    a. Anterior STEMI 01/2015 s/p overlapping DES x2 to prox LAD, DES to prox RCA in [facility].   CHF (congestive heart failure) (HCC)    Chronic back pain    stenosis/HNP   History of bronchitis 06/06/2012   History of colon polyps    Hyperlipidemia    Ischemic cardiomyopathy    Joint pain    Joint swelling    Myocardial infarction [facility])    Pneumonia 06/06/2012   Smokers' cough ([place])    Stress headaches    Tobacco abuse     Past Surgical History:  Procedure Laterality Date   ABDOMINAL HYSTERECTOMY     at age 21   BREAST BIOPSY Right    roughly 30 years ago with [facility]    cataract surgery Bilateral    CHOLECYSTECTOMY     colonscopy     ERCP     ESOPHAGOGASTRODUODENOSCOPY     LUMBAR LAMINECTOMY Right 07/25/2014   Procedure: Right L4 Hemilaminectomy, Right L4-5 Laminotomy , Decompression, Microdisectomy;  Surgeon: Marybelle Killings, MD;  Location: [place];  Service: Orthopedics;  Laterality: Right;   SHOULDER SURGERY Right    TONSILLECTOMY     as  a child    Current Medications: Current Outpatient Medications on File Prior to Visit  Medication Sig Dispense Refill   albuterol (VENTOLIN HFA) 108 (90 Base) MCG/ACT inhaler Inhale 1-2 puffs into the lungs every 6 (six) hours as needed for wheezing or shortness of breath. And or cough. 1 each 0   atorvastatin (LIPITOR) 80 MG tablet Take 1 tablet (80 mg total) by mouth daily. 90 tablet 3   carvedilol (COREG) 6.25 MG tablet Take 1 tablet (6.25 mg total) by mouth 2 (two) times daily with a meal. TAKE 1 TABLET BY MOUTH TWICE DAILY 60 tablet 6   clopidogrel (PLAVIX) 75 MG tablet Take 1 tablet (75 mg total) by mouth daily. 90 tablet 3   [facility] ASPIRIN 81 MG EC tablet Take 81 mg by mouth daily.  0   lisinopril (ZESTRIL) 40 MG tablet Take 1 tablet (40 mg total) by  mouth daily. 30 tablet 6   methocarbamol (ROBAXIN) 500 MG tablet Take 1 tablet (500 mg total) by mouth every 8 (eight) hours as needed for muscle spasms. 40 tablet 0   nitroGLYCERIN (NITROSTAT) 0.4 MG SL tablet Place 1 tablet (0.4 mg total) under the tongue every 5 (five) minutes as needed for chest pain. 25 tablet 0   pantoprazole (PROTONIX) 40 MG tablet Take 1 tablet (40 mg total) by mouth daily. 90 tablet 0   potassium chloride (KLOR-CON) 10 MEQ tablet Take 1 tablet (10 mEq total) by mouth daily. 90 tablet 3   No current facility-administered medications on file prior to visit.     Allergies:   Erythromycin   Social History   Socioeconomic History   Marital status: Married    Spouse name: Jerlisa Diliberto   Number of children: Not on file   Years of education: Not on file   Highest education level: Not on file  Occupational History   Occupation: retired  Tobacco Use   Smoking status: Every Day    Packs/day: 0.50    Years: 60.00    Total pack years: 30.00    Types: Cigarettes   Smokeless tobacco: Never  Vaping Use   Vaping Use: Never used  Substance and Sexual Activity   Alcohol use: No   Drug use: No   Sexual activity: Yes    Birth control/protection: Surgical  Other Topics Concern   Not on file  Social History Narrative   Not on file   Social Determinants of Health   Financial Resource Strain: Low Risk  (10/13/2021)   Overall Financial Resource Strain (CARDIA)    Difficulty of Paying Living Expenses: Not hard at all  Food Insecurity: No Food Insecurity (10/13/2021)   Hunger Vital Sign    Worried About Running Out of Food in the Last Year: Never true    [place] in the Last Year: Never true  Transportation Needs: No Transportation Needs (10/13/2021)   PRAPARE - Hydrologist (Medical): No    Lack of Transportation (Non-Medical): No  Physical Activity: Inactive (10/13/2021)   Exercise Vital Sign    Days of Exercise per Week: 0 days     Minutes of Exercise per Session: 0 min  Stress: No Stress Concern Present (10/13/2021)   [place]    Feeling of Stress : Not at all  Social Connections: Moderately Integrated (10/13/2021)   Social Connection and Isolation Panel [NHANES]    Frequency of Communication with Friends and Family: Three  times a week    Frequency of Social Gatherings with Friends and Family: Three times a week    Attends Religious Services: More than 4 times per year    Active Member of Clubs or Organizations: No    Attends Archivist Meetings: Never    Marital Status: Married     Family History: The patient's family history includes Breast cancer in her sister; CAD in an other family member; Heart disease in her father and mother; Heart failure in her father and mother.  ROS:   Please see the history of present illness.     All other systems reviewed and are negative.  EKGs/Labs/Other Studies Reviewed:    The following studies were reviewed today:   EKG:  EKG is  ordered today.  The ekg ordered today demonstrates   EKG 09/17/2021 - NSR, minimal LVH  Recent Labs: 06/11/2021: TSH 1.402 06/16/2021: B Natriuretic Peptide 300.4 07/29/2021: ALT 14 11/27/2021: Hemoglobin 11.9; Platelets 236 12/20/2021: BUN 14; Creatinine, Ser 0.89; Magnesium 2.0; Potassium 4.3; Sodium 132  Recent Lipid Panel    Component Value Date/Time   CHOL 112 07/29/2021 1507   CHOL 133 08/14/2020 1010   TRIG 148.0 07/29/2021 1507   HDL 39.10 07/29/2021 1507   HDL 41 08/14/2020 1010   CHOLHDL 3 07/29/2021 1507   VLDL 29.6 07/29/2021 1507   LDLCALC 44 07/29/2021 1507   LDLCALC 63 08/14/2020 1010     Risk Assessment/Calculations:           Physical Exam:    VS:  BP 130/80   Pulse 85   Ht 5' 2.5" (1.588 m)   Wt 171 lb 9.6 oz (77.8 kg)   SpO2 96%   BMI 30.89 kg/m     Wt Readings from Last 3 Encounters:  12/21/21 171 lb 9.6 oz (77.8 kg)   12/08/21 171 lb (77.6 kg)  12/02/21 175 lb 12.8 oz (79.7 kg)     GEN:  Well nourished, well developed in no acute distress HEENT: Normal NECK: No JVD;  LYMPHATICS: No lymphadenopathy CARDIAC: RRR, no murmurs, rubs, gallops RESPIRATORY:  + expiratory wheezes ABDOMEN: Soft, non-tender, non-distended MUSCULOSKELETAL:  No edema; No deformity  Vasc: Right radial 2+ SKIN: Warm and dry NEUROLOGIC:  Alert and oriented x 3 PSYCHIATRIC:  Normal affect   ASSESSMENT:    Ischemic cardiomyopathy NYHA Class III: RA07-62.  s/p anterior stemi 01/2015 DESx2 prox LAD, recent DES to prox and mid  RCA 99% s/p PCI 08/27/2021. With her symptoms similar to prior and persistent dyspnea will plan for LHC/RHC. Will also increase her  lasix 40 to 60 mg daily.  Plan for LHC today. Last cath did R radial.  Starting jardiance 10 mg daily. Continue asa and plavix ( if no new intervention, stops 08/28/2022)  DOE: She has expiratory wheezing, COPD is also playing a role in her symptoms. Plans for pulm visit  HTN: continue lisinopril 40 mg daily and coreg 6.25 mg BID.   HLD:  Continue atorvastatin 80 mg daily . LDL 44 07/29/2021  Smoking Cessation: we discussed that it is imperative that she stop smoking  PLAN:    In order of problems listed above:  Start SGLT2 Increase lasix to 60 mg daily LHC/RHC    Shared Decision Making/Informed Consent The risks [stroke (1 in 1000), death (1 in 998/08/26), kidney failure [usually temporary] (1 in 500), bleeding (1 in 200), allergic reaction [possibly serious] (1 in 200)], benefits (diagnostic support and management of coronary artery disease)  and alternatives of a cardiac catheterization were discussed in detail with Terri [name] and she is willing to proceed.         Medication Adjustments/Labs and Tests Ordered: Current medicines are reviewed at length with the patient today.  Concerns regarding medicines are outlined above.  No orders of the defined types were placed in  this encounter.  Meds ordered this encounter  Medications   empagliflozin (JARDIANCE) 10 MG TABS tablet    Sig: Take 1 tablet (10 mg total) by mouth daily before breakfast.    Dispense:  30 tablet    Refill:  3   furosemide (LASIX) 40 MG tablet    Sig: Take 1.5 tablets (60 mg total) by mouth daily.    Dispense:  45 tablet    Refill:  3    Patient Instructions  Medication Instructions:   START: JARDIANCE '10mg'$  ONCE DAILY   START: LASIX '60mg'$  ONCE DAILY   *If you need a refill on your cardiac medications before your next appointment, please call your pharmacy*  Lab Work: None Ordered At This Time.  If you have labs (blood work) drawn today and your tests are completely normal, you will receive your results only by: [place] (if you have MyChart) OR A paper copy in the mail If you have any lab test that is abnormal or we need to change your treatment, we will call you to review the results.  Testing/Procedures: Your physician has requested that you have a cardiac catheterization. Cardiac catheterization is used to diagnose and/or treat various heart conditions. Doctors may recommend this procedure for a number of different reasons. The most common reason is to evaluate chest pain. Chest pain can be a symptom of coronary artery disease (CAD), and cardiac catheterization can show whether plaque is narrowing or blocking your heart's arteries. This procedure is also used to evaluate the valves, as well as measure the blood flow and oxygen levels in different parts of your heart. For further information please visit [email or web address]. Please follow instruction sheet, as given.  Follow-Up: At [facility], you and your health needs are our priority.  As part of our continuing mission to provide you with exceptional heart care, we have created designated Provider Care Teams.  These Care Teams include your primary Cardiologist (physician) and Advanced Practice Providers (APPs -   Physician Assistants and Nurse Practitioners) who all work together to provide you with the care you need, when you need it.  Your next appointment:   1 month(s)  The format for your next appointment:   In Person  Provider:  Dr. Harl Bowie    Other Instructions  [facility] CARDIOVASCULAR DIVISION [facility] [place] [address] Dept: [phone] Loc: [phone]  Terri [name]  12/21/2021  You are scheduled for a Cardiac Catheterization on Thursday, July 20 with Dr. Peter Martinique.  1. Please arrive at the Main Entrance A at [facility]: [place], [place] [address] at 11:30 AM (This time is two hours before your procedure to ensure your preparation). Free valet parking service is available.   Special note: Every effort is made to have your procedure done on time. Please understand that emergencies sometimes delay scheduled procedures.  2. Diet: Do not eat solid foods after midnight.  You may have clear liquids until 5 AM upon the day of the procedure.  4. Medication instructions in preparation for your procedure:   Contrast Allergy: No  DO NOT TAKE JARDIANCE OR LASIX THE MORNING OF HEART CATH  On the morning of your procedure, take Aspirin and Plavix/Clopidogrel and any morning medicines NOT listed above.  You may use sips of water.  5. Plan to go home the same day, you will only stay overnight if medically necessary. 6. You MUST have a responsible adult to drive you home. 7. An adult MUST be with you the first 24 hours after you arrive home. 8. Bring a current list of your medications, and the last time and date medication taken. 9. Bring ID and current insurance cards. 10.Please wear clothes that are easy to get on and off and wear slip-on shoes.  Thank you for allowing [place] to care for you!   --  Invasive Cardiovascular services         Signed, Janina Mayo, MD   12/21/2021 11:05 AM    [place]

## 2021-12-21 NOTE — H&P (View-Only) (Signed)
Cardiology Office Note:    Date:  12/21/2021   ID:  Terri [name], DOB 12-05-1945, MRN [id number]  PCP:  Terri Maw, MD   [facility] Providers Cardiologist:  Terri Burow, MD     Referring MD: Terri [name]   No chief complaint on file. Follow up  History of Present Illness:    Terri [name] is a 76 y.o. female with a hx of smoking, CAD s/p anterior stemi 01/2015 DESx2 prox LAD, recent DES to prox and mid  RCA  She brings in DC summary from [facility]. She presented with palpitations and SOB to the hospital there. She also reported indigestion.. She underwent LHC, PCI of an occluded mid and prox RCA (written on  the back of her DC summary). Noted to be 99% on  a voicemail from the internationalist. Echo showed EF 40%, mild MR, no aortic stenosis or AI, no pericardial effusion. Her husband noted that her blood pressure dropped suddenly.  She had CPR. No shockable rhythm. This was March 24 , 2023.  She feels much better. She's on DAPT. Her blood pressure is well controlled. Still smoking. No PND. No orthopnea. No Le edema.   Interval Hx: She went to the ED for  mid-sternal chest pain 6/24. She was given nitro. She said her arm hurt and she was diaphoretic. Her chest pain resolved. EKG did not show any ischemic changes. Her troponin was very minimal.  Today, she notes no energy. She sleeps a lot. She is fatigued with minimal activity.  She notes both sides of her chest aches. She denies orthopnea, PND. No LE edema. She has significant shortness of breath with minimal activity.  She feels congested. She is coughing up clear phlegm which is chronic. She states these symptoms are the same ones she had prior to her PCI in March and have persisted.    Cardiology Studies Myoview-08/10/2021-large defect with moderate intensity in the apical to basal anterior, anteroseptal and apex that's fixed  06/11/2021-TTE- EF 40-45%, mildly reduced. 04/28/2021- EF 50-55, RV is normal,  mild-mod MR, AI mild to moderate  Past Medical History:  Diagnosis Date   Aortic insufficiency    a. Mild AI by echo 2014.   Arthritis    CAD (coronary artery disease)    a. Anterior STEMI 01/2015 s/p overlapping DES x2 to prox LAD, DES to prox RCA in [facility].   CHF (congestive heart failure) (HCC)    Chronic back pain    stenosis/HNP   History of bronchitis 06/06/2012   History of colon polyps    Hyperlipidemia    Ischemic cardiomyopathy    Joint pain    Joint swelling    Myocardial infarction [facility])    Pneumonia 06/06/2012   Smokers' cough ([place])    Stress headaches    Tobacco abuse     Past Surgical History:  Procedure Laterality Date   ABDOMINAL HYSTERECTOMY     at age 53   BREAST BIOPSY Right    roughly 30 years ago with [facility]    cataract surgery Bilateral    CHOLECYSTECTOMY     colonscopy     ERCP     ESOPHAGOGASTRODUODENOSCOPY     LUMBAR LAMINECTOMY Right 07/25/2014   Procedure: Right L4 Hemilaminectomy, Right L4-5 Laminotomy , Decompression, Microdisectomy;  Surgeon: Marybelle Killings, MD;  Location: [place];  Service: Orthopedics;  Laterality: Right;   SHOULDER SURGERY Right    TONSILLECTOMY     as  a child    Current Medications: Current Outpatient Medications on File Prior to Visit  Medication Sig Dispense Refill   albuterol (VENTOLIN HFA) 108 (90 Base) MCG/ACT inhaler Inhale 1-2 puffs into the lungs every 6 (six) hours as needed for wheezing or shortness of breath. And or cough. 1 each 0   atorvastatin (LIPITOR) 80 MG tablet Take 1 tablet (80 mg total) by mouth daily. 90 tablet 3   carvedilol (COREG) 6.25 MG tablet Take 1 tablet (6.25 mg total) by mouth 2 (two) times daily with a meal. TAKE 1 TABLET BY MOUTH TWICE DAILY 60 tablet 6   clopidogrel (PLAVIX) 75 MG tablet Take 1 tablet (75 mg total) by mouth daily. 90 tablet 3   [facility] ASPIRIN 81 MG EC tablet Take 81 mg by mouth daily.  0   lisinopril (ZESTRIL) 40 MG tablet Take 1 tablet (40 mg total) by  mouth daily. 30 tablet 6   methocarbamol (ROBAXIN) 500 MG tablet Take 1 tablet (500 mg total) by mouth every 8 (eight) hours as needed for muscle spasms. 40 tablet 0   nitroGLYCERIN (NITROSTAT) 0.4 MG SL tablet Place 1 tablet (0.4 mg total) under the tongue every 5 (five) minutes as needed for chest pain. 25 tablet 0   pantoprazole (PROTONIX) 40 MG tablet Take 1 tablet (40 mg total) by mouth daily. 90 tablet 0   potassium chloride (KLOR-CON) 10 MEQ tablet Take 1 tablet (10 mEq total) by mouth daily. 90 tablet 3   No current facility-administered medications on file prior to visit.     Allergies:   Erythromycin   Social History   Socioeconomic History   Marital status: Married    Spouse name: Katheren Jimmerson   Number of children: Not on file   Years of education: Not on file   Highest education level: Not on file  Occupational History   Occupation: retired  Tobacco Use   Smoking status: Every Day    Packs/day: 0.50    Years: 60.00    Total pack years: 30.00    Types: Cigarettes   Smokeless tobacco: Never  Vaping Use   Vaping Use: Never used  Substance and Sexual Activity   Alcohol use: No   Drug use: No   Sexual activity: Yes    Birth control/protection: Surgical  Other Topics Concern   Not on file  Social History Narrative   Not on file   Social Determinants of Health   Financial Resource Strain: Low Risk  (10/13/2021)   Overall Financial Resource Strain (CARDIA)    Difficulty of Paying Living Expenses: Not hard at all  Food Insecurity: No Food Insecurity (10/13/2021)   Hunger Vital Sign    Worried About Running Out of Food in the Last Year: Never true    [place] in the Last Year: Never true  Transportation Needs: No Transportation Needs (10/13/2021)   PRAPARE - Hydrologist (Medical): No    Lack of Transportation (Non-Medical): No  Physical Activity: Inactive (10/13/2021)   Exercise Vital Sign    Days of Exercise per Week: 0 days     Minutes of Exercise per Session: 0 min  Stress: No Stress Concern Present (10/13/2021)   [place]    Feeling of Stress : Not at all  Social Connections: Moderately Integrated (10/13/2021)   Social Connection and Isolation Panel [NHANES]    Frequency of Communication with Friends and Family: Three  times a week    Frequency of Social Gatherings with Friends and Family: Three times a week    Attends Religious Services: More than 4 times per year    Active Member of Clubs or Organizations: No    Attends Archivist Meetings: Never    Marital Status: Married     Family History: The patient's family history includes Breast cancer in her sister; CAD in an other family member; Heart disease in her father and mother; Heart failure in her father and mother.  ROS:   Please see the history of present illness.     All other systems reviewed and are negative.  EKGs/Labs/Other Studies Reviewed:    The following studies were reviewed today:   EKG:  EKG is  ordered today.  The ekg ordered today demonstrates   EKG 09/17/2021 - NSR, minimal LVH  Recent Labs: 06/11/2021: TSH 1.402 06/16/2021: B Natriuretic Peptide 300.4 07/29/2021: ALT 14 11/27/2021: Hemoglobin 11.9; Platelets 236 12/20/2021: BUN 14; Creatinine, Ser 0.89; Magnesium 2.0; Potassium 4.3; Sodium 132  Recent Lipid Panel    Component Value Date/Time   CHOL 112 07/29/2021 1507   CHOL 133 08/14/2020 1010   TRIG 148.0 07/29/2021 1507   HDL 39.10 07/29/2021 1507   HDL 41 08/14/2020 1010   CHOLHDL 3 07/29/2021 1507   VLDL 29.6 07/29/2021 1507   LDLCALC 44 07/29/2021 1507   LDLCALC 63 08/14/2020 1010     Risk Assessment/Calculations:           Physical Exam:    VS:  BP 130/80   Pulse 85   Ht 5' 2.5" (1.588 m)   Wt 171 lb 9.6 oz (77.8 kg)   SpO2 96%   BMI 30.89 kg/m     Wt Readings from Last 3 Encounters:  12/21/21 171 lb 9.6 oz (77.8 kg)   12/08/21 171 lb (77.6 kg)  12/02/21 175 lb 12.8 oz (79.7 kg)     GEN:  Well nourished, well developed in no acute distress HEENT: Normal NECK: No JVD;  LYMPHATICS: No lymphadenopathy CARDIAC: RRR, no murmurs, rubs, gallops RESPIRATORY:  + expiratory wheezes ABDOMEN: Soft, non-tender, non-distended MUSCULOSKELETAL:  No edema; No deformity  Vasc: Right radial 2+ SKIN: Warm and dry NEUROLOGIC:  Alert and oriented x 3 PSYCHIATRIC:  Normal affect   ASSESSMENT:    Ischemic cardiomyopathy NYHA Class III: SE83-15.  s/p anterior stemi 01/2015 DESx2 prox LAD, recent DES to prox and mid  RCA 99% s/p PCI 08/27/2021. With her symptoms similar to prior and persistent dyspnea will plan for LHC/RHC. Will also increase her  lasix 40 to 60 mg daily.  Plan for LHC today. Last cath did R radial.  Starting jardiance 10 mg daily. Continue asa and plavix ( if no new intervention, stops 08/28/2022)  DOE: She has expiratory wheezing, COPD is also playing a role in her symptoms. Plans for pulm visit  HTN: continue lisinopril 40 mg daily and coreg 6.25 mg BID.   HLD:  Continue atorvastatin 80 mg daily . LDL 44 07/29/2021  Smoking Cessation: we discussed that it is imperative that she stop smoking  PLAN:    In order of problems listed above:  Start SGLT2 Increase lasix to 60 mg daily LHC/RHC    Shared Decision Making/Informed Consent The risks [stroke (1 in 1000), death (1 in 998/09/16), kidney failure [usually temporary] (1 in 500), bleeding (1 in 200), allergic reaction [possibly serious] (1 in 200)], benefits (diagnostic support and management of coronary artery disease)  and alternatives of a cardiac catheterization were discussed in detail with Terri [name] and she is willing to proceed.         Medication Adjustments/Labs and Tests Ordered: Current medicines are reviewed at length with the patient today.  Concerns regarding medicines are outlined above.  No orders of the defined types were placed in  this encounter.  Meds ordered this encounter  Medications   empagliflozin (JARDIANCE) 10 MG TABS tablet    Sig: Take 1 tablet (10 mg total) by mouth daily before breakfast.    Dispense:  30 tablet    Refill:  3   furosemide (LASIX) 40 MG tablet    Sig: Take 1.5 tablets (60 mg total) by mouth daily.    Dispense:  45 tablet    Refill:  3    Patient Instructions  Medication Instructions:   START: JARDIANCE '10mg'$  ONCE DAILY   START: LASIX '60mg'$  ONCE DAILY   *If you need a refill on your cardiac medications before your next appointment, please call your pharmacy*  Lab Work: None Ordered At This Time.  If you have labs (blood work) drawn today and your tests are completely normal, you will receive your results only by: [place] (if you have MyChart) OR A paper copy in the mail If you have any lab test that is abnormal or we need to change your treatment, we will call you to review the results.  Testing/Procedures: Your physician has requested that you have a cardiac catheterization. Cardiac catheterization is used to diagnose and/or treat various heart conditions. Doctors may recommend this procedure for a number of different reasons. The most common reason is to evaluate chest pain. Chest pain can be a symptom of coronary artery disease (CAD), and cardiac catheterization can show whether plaque is narrowing or blocking your heart's arteries. This procedure is also used to evaluate the valves, as well as measure the blood flow and oxygen levels in different parts of your heart. For further information please visit HugeFiesta.tn. Please follow instruction sheet, as given.  Follow-Up: At [facility], you and your health needs are our priority.  As part of our continuing mission to provide you with exceptional heart care, we have created designated Provider Care Teams.  These Care Teams include your primary Cardiologist (physician) and Advanced Practice Providers (APPs -   Physician Assistants and Nurse Practitioners) who all work together to provide you with the care you need, when you need it.  Your next appointment:   1 month(s)  The format for your next appointment:   In Person  Provider:  Dr. Harl Bowie    Other Instructions  [facility] CARDIOVASCULAR DIVISION [facility] [place] [address] Dept: [phone] Loc: [phone]  Terri [name]  12/21/2021  You are scheduled for a Cardiac Catheterization on Thursday, July 20 with Dr. Peter Martinique.  1. Please arrive at the Main Entrance A at [facility]: [place], [place] [address] at 11:30 AM (This time is two hours before your procedure to ensure your preparation). Free valet parking service is available.   Special note: Every effort is made to have your procedure done on time. Please understand that emergencies sometimes delay scheduled procedures.  2. Diet: Do not eat solid foods after midnight.  You may have clear liquids until 5 AM upon the day of the procedure.  4. Medication instructions in preparation for your procedure:   Contrast Allergy: No  DO NOT TAKE JARDIANCE OR LASIX THE MORNING OF HEART CATH  On the morning of your procedure, take Aspirin and Plavix/Clopidogrel and any morning medicines NOT listed above.  You may use sips of water.  5. Plan to go home the same day, you will only stay overnight if medically necessary. 6. You MUST have a responsible adult to drive you home. 7. An adult MUST be with you the first 24 hours after you arrive home. 8. Bring a current list of your medications, and the last time and date medication taken. 9. Bring ID and current insurance cards. 10.Please wear clothes that are easy to get on and off and wear slip-on shoes.  Thank you for allowing [place] to care for you!   -- [place] [facility]         Signed, Janina Mayo, MD   12/21/2021 11:05 AM    [place]

## 2021-12-21 NOTE — Patient Instructions (Addendum)
Medication Instructions:   START: JARDIANCE '10mg'$  ONCE DAILY   START: LASIX '60mg'$  ONCE DAILY   *If you need a refill on your cardiac medications before your next appointment, please call your pharmacy*  Lab Work: None Ordered At This Time.  If you have labs (blood work) drawn today and your tests are completely normal, you will receive your results only by: [place] (if you have MyChart) OR A paper copy in the mail If you have any lab test that is abnormal or we need to change your treatment, we will call you to review the results.  Testing/Procedures: Your physician has requested that you have a cardiac catheterization. Cardiac catheterization is used to diagnose and/or treat various heart conditions. Doctors may recommend this procedure for a number of different reasons. The most common reason is to evaluate chest pain. Chest pain can be a symptom of coronary artery disease (CAD), and cardiac catheterization can show whether plaque is narrowing or blocking your heart's arteries. This procedure is also used to evaluate the valves, as well as measure the blood flow and oxygen levels in different parts of your heart. For further information please visit HugeFiesta.[email or web address]. Please follow instruction sheet, as given.  Follow-Up: At [facility], you and your health needs are our priority.  As part of our continuing mission to provide you with exceptional heart care, we have created designated Provider Care Teams.  These Care Teams include your primary Cardiologist (physician) and Advanced Practice Providers (APPs -  Physician Assistants and Nurse Practitioners) who all work together to provide you with the care you need, when you need it.  Your next appointment:   1 month(s)  The format for your next appointment:   In Person  Provider:  Dr. Harl Bowie    Other Instructions  [facility] CARDIOVASCULAR DIVISION [facility] [place] [address] Dept: [phone] Loc: [phone]  Terri [name]  12/21/2021  You are scheduled for a Cardiac Catheterization on Thursday, July 20 with Dr. Peter Martinique.  1. Please arrive at the Main Entrance A at [facility]: [place], [place] [address] at 11:30 AM (This time is two hours before your procedure to ensure your preparation). Free valet parking service is available.   Special note: Every effort is made to have your procedure done on time. Please understand that emergencies sometimes delay scheduled procedures.  2. Diet: Do not eat solid foods after midnight.  You may have clear liquids until 5 AM upon the day of the procedure.  4. Medication instructions in preparation for your procedure:   Contrast Allergy: No  DO NOT TAKE JARDIANCE OR LASIX THE MORNING OF HEART CATH  On the morning of your procedure, take Aspirin and Plavix/Clopidogrel and any morning medicines NOT listed above.  You may use sips of water.  5. Plan to go home the same day, you will only stay overnight if medically necessary. 6. You MUST have a responsible adult to drive you home. 7. An adult MUST be with you the first 24 hours after you arrive home. 8. Bring a current list of your medications, and the last time and date medication taken. 9. Bring ID and current insurance cards. 10.Please wear clothes that are easy to get on and off and wear slip-on shoes.  Thank you for allowing [place] to care for you!   -- [place] [facility]

## 2021-12-22 ENCOUNTER — Telehealth: Payer: Self-pay | Admitting: *Deleted

## 2021-12-22 ENCOUNTER — Ambulatory Visit
Admission: RE | Admit: 2021-12-22 | Discharge: 2021-12-22 | Disposition: A | Discharge status: Home / Self Care | Payer: Self-pay | Source: Ambulatory Visit | Attending: Cardiology | Admitting: Cardiology

## 2021-12-22 NOTE — Telephone Encounter (Signed)
Cardiac Catheterization scheduled at [facility] for: Thursday December 23, 2021 1:30 PM Arrival time and place: [place] Entrance A at: 11:30 AM   Nothing to eat after midnight prior to procedure, clear liquids until 5 AM day of procedure.  Medication instructions: -Hold:  Jardiance-AM of procedure  Lasix/KCl-AM of procedure -Except hold medications usual morning medications can be taken with sips of water including aspirin 81 mg and Plavix 75 mg.  Confirmed patient has responsible adult to drive home post procedure and be with patient first 24 hours after arriving home.  Patient reports no new symptoms concerning for COVID-19 in the past 10 days.  Reviewed procedure instructions with patient.

## 2021-12-23 ENCOUNTER — Ambulatory Visit (HOSPITAL_COMMUNITY)
Admission: RE | Disposition: A | Discharge status: Home / Self Care | Payer: Self-pay | Source: Home / Self Care | Attending: Cardiology

## 2021-12-23 ENCOUNTER — Other Ambulatory Visit: Payer: Self-pay

## 2021-12-23 ENCOUNTER — Encounter: Payer: Self-pay | Admitting: Pulmonary Disease

## 2021-12-23 ENCOUNTER — Ambulatory Visit: Payer: Self-pay

## 2021-12-23 ENCOUNTER — Ambulatory Visit (INDEPENDENT_AMBULATORY_CARE_PROVIDER_SITE_OTHER): Payer: Medicare Other | Admitting: Pulmonary Disease

## 2021-12-23 ENCOUNTER — Ambulatory Visit (HOSPITAL_COMMUNITY)
Admission: RE | Admit: 2021-12-23 | Discharge: 2021-12-23 | Disposition: A | Discharge status: Home / Self Care | Payer: Medicare Other | Attending: Cardiology | Admitting: Cardiology

## 2021-12-23 VITALS — BP 132/68 | HR 103 | Temp 98.2°F | Ht 62.0 in | Wt 170.0 lb

## 2021-12-23 DIAGNOSIS — R079 Chest pain, unspecified: Secondary | ICD-10-CM | POA: Diagnosis present

## 2021-12-23 DIAGNOSIS — I255 Ischemic cardiomyopathy: Secondary | ICD-10-CM | POA: Insufficient documentation

## 2021-12-23 DIAGNOSIS — J449 Chronic obstructive pulmonary disease, unspecified: Secondary | ICD-10-CM | POA: Insufficient documentation

## 2021-12-23 DIAGNOSIS — I252 Old myocardial infarction: Secondary | ICD-10-CM | POA: Insufficient documentation

## 2021-12-23 DIAGNOSIS — E785 Hyperlipidemia, unspecified: Secondary | ICD-10-CM | POA: Insufficient documentation

## 2021-12-23 DIAGNOSIS — Z955 Presence of coronary angioplasty implant and graft: Secondary | ICD-10-CM | POA: Insufficient documentation

## 2021-12-23 DIAGNOSIS — J42 Unspecified chronic bronchitis: Secondary | ICD-10-CM

## 2021-12-23 DIAGNOSIS — I2510 Atherosclerotic heart disease of native coronary artery without angina pectoris: Secondary | ICD-10-CM | POA: Diagnosis not present

## 2021-12-23 DIAGNOSIS — I509 Heart failure, unspecified: Secondary | ICD-10-CM | POA: Diagnosis not present

## 2021-12-23 DIAGNOSIS — R0609 Other forms of dyspnea: Secondary | ICD-10-CM | POA: Insufficient documentation

## 2021-12-23 DIAGNOSIS — J441 Chronic obstructive pulmonary disease with (acute) exacerbation: Secondary | ICD-10-CM | POA: Diagnosis not present

## 2021-12-23 DIAGNOSIS — F17210 Nicotine dependence, cigarettes, uncomplicated: Secondary | ICD-10-CM | POA: Insufficient documentation

## 2021-12-23 DIAGNOSIS — I110 Hypertensive heart disease with heart failure: Secondary | ICD-10-CM | POA: Diagnosis not present

## 2021-12-23 HISTORY — PX: RIGHT/LEFT HEART CATH AND CORONARY ANGIOGRAPHY: CATH118266

## 2021-12-23 LAB — POCT I-STAT EG7
Acid-Base Excess: 1 mmol/L (ref 0.0–2.0)
Acid-Base Excess: 2 mmol/L (ref 0.0–2.0)
Bicarbonate: 25.6 mmol/L (ref 20.0–28.0)
Bicarbonate: 27 mmol/L (ref 20.0–28.0)
Calcium, Ion: 1.22 mmol/L (ref 1.15–1.40)
Calcium, Ion: 1.19 mmol/L (ref 1.15–1.40)
HCT: 30 % — ABNORMAL LOW (ref 36.0–46.0)
HCT: 29 % — ABNORMAL LOW (ref 36.0–46.0)
Hemoglobin: 10.2 g/dL — ABNORMAL LOW (ref 12.0–15.0)
Hemoglobin: 9.9 g/dL — ABNORMAL LOW (ref 12.0–15.0)
O2 Saturation: 70 %
O2 Saturation: 65 %
Potassium: 3.5 mmol/L (ref 3.5–5.1)
Potassium: 3.4 mmol/L — ABNORMAL LOW (ref 3.5–5.1)
Sodium: 135 mmol/L (ref 135–145)
Sodium: 136 mmol/L (ref 135–145)
TCO2: 27 mmol/L (ref 22–32)
TCO2: 28 mmol/L (ref 22–32)
pCO2, Ven: 41.5 mmHg — ABNORMAL LOW (ref 44–60)
pCO2, Ven: 43.7 mmHg — ABNORMAL LOW (ref 44–60)
pH, Ven: 7.398 (ref 7.25–7.43)
pH, Ven: 7.399 (ref 7.25–7.43)
pO2, Ven: 37 mmHg (ref 32–45)
pO2, Ven: 34 mmHg (ref 32–45)

## 2021-12-23 LAB — POCT I-STAT 7, (LYTES, BLD GAS, ICA,H+H)
Acid-Base Excess: 0 mmol/L (ref 0.0–2.0)
Bicarbonate: 25.1 mmol/L (ref 20.0–28.0)
Calcium, Ion: 1.19 mmol/L (ref 1.15–1.40)
HCT: 29 % — ABNORMAL LOW (ref 36.0–46.0)
Hemoglobin: 9.9 g/dL — ABNORMAL LOW (ref 12.0–15.0)
O2 Saturation: 95 %
Potassium: 3.3 mmol/L — ABNORMAL LOW (ref 3.5–5.1)
Sodium: 130 mmol/L — ABNORMAL LOW (ref 135–145)
TCO2: 26 mmol/L (ref 22–32)
pCO2 arterial: 43.9 mmHg (ref 32–48)
pH, Arterial: 7.366 (ref 7.35–7.45)
pO2, Arterial: 80 mmHg — ABNORMAL LOW (ref 83–108)

## 2021-12-23 SURGERY — RIGHT/LEFT HEART CATH AND CORONARY ANGIOGRAPHY
Anesthesia: LOCAL

## 2021-12-23 MED ORDER — HEPARIN (PORCINE) IN NACL 1000-0.9 UT/500ML-% IV SOLN
INTRAVENOUS | Status: DC | PRN
  Administered 2021-12-23 (×2): 500 mL

## 2021-12-23 MED ORDER — LIDOCAINE HCL (PF) 1 % IJ SOLN
INTRAMUSCULAR | Status: AC
  Filled 2021-12-23: qty 30

## 2021-12-23 MED ORDER — IOHEXOL 350 MG/ML SOLN
INTRAVENOUS | Status: DC | PRN
  Administered 2021-12-23: 65 mL

## 2021-12-23 MED ORDER — ASPIRIN 81 MG PO CHEW: 81.0000 mg | CHEWABLE_TABLET | ORAL | Status: DC

## 2021-12-23 MED ORDER — SODIUM CHLORIDE 0.9% FLUSH: 3.0000 mL | INTRAVENOUS | Status: DC | PRN

## 2021-12-23 MED ORDER — MIDAZOLAM HCL 2 MG/2ML IJ SOLN
INTRAMUSCULAR | Status: AC
  Filled 2021-12-23: qty 2

## 2021-12-23 MED ORDER — ALBUTEROL SULFATE HFA 108 (90 BASE) MCG/ACT IN AERS: 1.0000 | INHALATION_SPRAY | Freq: Four times a day (QID) | RESPIRATORY_TRACT | 2 refills | Status: DC | PRN

## 2021-12-23 MED ORDER — HYDRALAZINE HCL 20 MG/ML IJ SOLN: 10.0000 mg | INTRAMUSCULAR | Status: DC | PRN

## 2021-12-23 MED ORDER — LIDOCAINE HCL (PF) 1 % IJ SOLN
INTRAMUSCULAR | Status: DC | PRN
  Administered 2021-12-23: 15 mL

## 2021-12-23 MED ORDER — CLOPIDOGREL BISULFATE 75 MG PO TABS: 75.0000 mg | ORAL_TABLET | ORAL | Status: DC

## 2021-12-23 MED ORDER — VERAPAMIL HCL 2.5 MG/ML IV SOLN
INTRAVENOUS | Status: AC
  Filled 2021-12-23: qty 2

## 2021-12-23 MED ORDER — SODIUM CHLORIDE 0.9% FLUSH: 3.0000 mL | Freq: Two times a day (BID) | INTRAVENOUS | Status: DC

## 2021-12-23 MED ORDER — PREDNISONE 10 MG PO TABS: 40.0000 mg | ORAL_TABLET | Freq: Every day | ORAL | 0 refills | Status: AC

## 2021-12-23 MED ORDER — SODIUM CHLORIDE 0.9 % IV SOLN: 250.0000 mL | INTRAVENOUS | Status: DC | PRN

## 2021-12-23 MED ORDER — ACETAMINOPHEN 325 MG PO TABS
650.0000 mg | ORAL_TABLET | ORAL | Status: DC | PRN
Start: 2021-12-23 — End: 2021-12-23

## 2021-12-23 MED ORDER — FENTANYL CITRATE (PF) 100 MCG/2ML IJ SOLN
INTRAMUSCULAR | Status: DC | PRN
  Administered 2021-12-23: 25 ug via INTRAVENOUS

## 2021-12-23 MED ORDER — SODIUM CHLORIDE 0.9% FLUSH
3.0000 mL | Freq: Two times a day (BID) | INTRAVENOUS | Status: DC
Start: 2021-12-24 — End: 2021-12-23

## 2021-12-23 MED ORDER — SODIUM CHLORIDE 0.9 % WEIGHT BASED INFUSION
1.0000 mL/kg/h | INTRAVENOUS | Status: DC
Start: 2021-12-23 — End: 2021-12-23

## 2021-12-23 MED ORDER — FENTANYL CITRATE (PF) 100 MCG/2ML IJ SOLN
INTRAMUSCULAR | Status: AC
  Filled 2021-12-23: qty 2

## 2021-12-23 MED ORDER — SODIUM CHLORIDE 0.9 % IV SOLN
INTRAVENOUS | Status: DC
Start: 2021-12-23 — End: 2021-12-23

## 2021-12-23 MED ORDER — ONDANSETRON HCL 4 MG/2ML IJ SOLN: 4.0000 mg | Freq: Four times a day (QID) | INTRAMUSCULAR | Status: DC | PRN

## 2021-12-23 MED ORDER — MIDAZOLAM HCL 2 MG/2ML IJ SOLN
INTRAMUSCULAR | Status: DC | PRN
  Administered 2021-12-23 (×2): 1 mg via INTRAVENOUS

## 2021-12-23 MED ORDER — TRELEGY ELLIPTA 200-62.5-25 MCG/ACT IN AEPB: 1.0000 | INHALATION_SPRAY | Freq: Every day | RESPIRATORY_TRACT | 2 refills | Status: DC

## 2021-12-23 MED ORDER — AZITHROMYCIN 250 MG PO TABS: ORAL_TABLET | ORAL | 0 refills | Status: DC

## 2021-12-23 SURGICAL SUPPLY — 15 items
CATH INFINITI 5FR MULTPACK ANG (CATHETERS) ×1 IMPLANT
CATH SWAN GANZ 7F STRAIGHT (CATHETERS) ×1 IMPLANT
CLOSURE MYNX CONTROL 5F (Vascular Products) ×1 IMPLANT
GLIDESHEATH SLEND SS 6F .021 (SHEATH) IMPLANT
GUIDEWIRE INQWIRE 1.5J.035X260 (WIRE) IMPLANT
INQWIRE 1.5J .035X260CM (WIRE) ×2
KIT HEART LEFT (KITS) ×2 IMPLANT
PACK CARDIAC CATHETERIZATION (CUSTOM PROCEDURE TRAY) ×2 IMPLANT
SHEATH GLIDE SLENDER 4/5FR (SHEATH) IMPLANT
SHEATH PINNACLE 5F 10CM (SHEATH) ×1 IMPLANT
SHEATH PINNACLE 7F 10CM (SHEATH) ×1 IMPLANT
SHEATH PROBE COVER 6X72 (BAG) ×1 IMPLANT
TRANSDUCER W/STOPCOCK (MISCELLANEOUS) ×2 IMPLANT
TUBING CIL FLEX 10 FLL-RA (TUBING) ×2 IMPLANT
WIRE EMERALD 3MM-J .035X150CM (WIRE) ×1 IMPLANT

## 2021-12-23 NOTE — Interval H&P Note (Signed)
History and Physical Interval Note:  12/23/2021 12:31 PM  Terri [name]  has presented today for surgery, with the diagnosis of shortness of breath.  The various methods of treatment have been discussed with the patient and family. After consideration of risks, benefits and other options for treatment, the patient has consented to  Procedure(s): RIGHT/LEFT HEART CATH AND CORONARY ANGIOGRAPHY (N/A) as a surgical intervention.  The patient's history has been reviewed, patient examined, no change in status, stable for surgery.  I have reviewed the patient's chart and labs.  Questions were answered to the patient's satisfaction.   Cath Lab Visit (complete for each Cath Lab visit)  Clinical Evaluation Leading to the Procedure:   ACS: No.  Non-ACS:    Anginal Classification: CCS III  Anti-ischemic medical therapy: Minimal Therapy (1 class of medications)  Non-Invasive Test Results: No non-invasive testing performed  Prior CABG: No previous CABG        Collier [facility] 12/23/2021 12:31 PM

## 2021-12-23 NOTE — Progress Notes (Signed)
1530- patient ambulated w/ assist to BR. Right groin remains unchanged, site soft and only with old pink drainage.

## 2021-12-23 NOTE — Patient Instructions (Addendum)
COPD exacerbation --START prednisone 40 mg x 5 days --START azithromycin x 5 days --START Trelegy inhaler ONE puff ONCE a day. Rinse mouth after use --CONTINUE Albuterol AS NEEDED every 4-6 hours for shortness of breath, cough and wheezing. --Ambulatory O2 in-office performed  Follow-up with me in 6 weeks

## 2021-12-23 NOTE — Progress Notes (Signed)
Subjective:   PATIENT ID: Terri [name] GENDER: female DOB: 02-06-1946, MRN: [id number]   HPI  Chief Complaint  Patient presents with   Consult    Pt is here for consult for emphysema. Husband states she has gotten worse in the last few weeks. Pt states SOB when up walking around and wheezing noted. Labored breathing noted from husband. This got triggered by new floors and painting the house. Pt uses albuterol as needed     Reason for Visit: New consult for emphysema  [name] is a 76 year old female active smoker, CAD s/p DES x2 to pLAD and recent DES to RCA, HTN, HLD who presents for new consult for emphysema.  She has had long standing shortness of breath that has worsened in the last 3-4 weeks. Husband states she her breathing is labored and occurs with exertion. Her home was recently undergoing construction with new flooring and increased dust in the home. Unable to walk across the room and increased productive cough with clear phlegm during the day and night. Has had decreased energy and is sleeping more often during the day and night. Associated with wheezing. Previously she was walking often and going golfing a month ago. She is taking mucinex 3-4 days last week.  She was recently seen in the ED for chest pain and shortness of breath. on 6/24. EKG and troponin unremarkable. Followed up with Cardiology on 7/18 with significant shortness of breath and chronic productive cough. Her lasix was increased to 60 mg daily and scheduled for LHC/RHC later today.  Social History: Active smoker. Started in early teens. Previously 1 ppd. Currently 1/4 ppd.  I have personally reviewed patient's past medical/family/social history, allergies, current medications.  Past Medical History:  Diagnosis Date   Aortic insufficiency    a. Mild AI by echo 2014.   Arthritis    CAD (coronary artery disease)    a. Anterior STEMI 01/2015 s/p overlapping DES x2 to prox LAD, DES to prox RCA in [facility].   CHF (congestive heart failure) (HCC)    Chronic back pain    stenosis/HNP   History of bronchitis 06/06/2012   History of colon polyps    Hyperlipidemia    Ischemic cardiomyopathy    Joint pain    Joint swelling    Myocardial infarction [facility])    Pneumonia 06/06/2012   Smokers' cough (HCC)    Stress headaches    Tobacco abuse      Family History  Problem Relation Age of Onset   Heart disease Mother    Heart failure Mother    Heart disease Father    Heart failure Father    CAD Other        both parents and multiple siblings with either died or had-related issues   Breast cancer Sister      Social History   Occupational History   Occupation: retired  Tobacco Use   Smoking status: Every Day    Packs/day: 0.50    Years: 60.00    Total pack years: 30.00    Types: Cigarettes   Smokeless tobacco: Never  Vaping Use   Vaping Use: Never used  Substance and Sexual Activity   Alcohol use: No   Drug use: No   Sexual activity: Yes    Birth control/protection: Surgical    Allergies  Allergen Reactions   Erythromycin Nausea Only     Outpatient Medications Prior to Visit  Medication Sig Dispense  Refill   atorvastatin (LIPITOR) 80 MG tablet Take 1 tablet (80 mg total) by mouth daily. (Patient taking differently: Take 80 mg by mouth at bedtime.) 90 tablet 3   carvedilol (COREG) 6.25 MG tablet Take 1 tablet (6.25 mg total) by mouth 2 (two) times daily with a meal. TAKE 1 TABLET BY MOUTH TWICE DAILY 60 tablet 6   clopidogrel (PLAVIX) 75 MG tablet Take 1 tablet (75 mg total) by mouth daily. 90 tablet 3   [employer] ASPIRIN 81 MG EC tablet Take 81 mg by mouth in the morning.  0   empagliflozin (JARDIANCE) 10 MG TABS tablet Take 1 tablet (10 mg total) by mouth daily before breakfast. 30 tablet 3   furosemide (LASIX) 40 MG tablet Take 1.5 tablets (60 mg total) by mouth daily. 45 tablet 3   lisinopril (ZESTRIL) 40 MG tablet Take 1 tablet (40 mg total) by mouth daily.  30 tablet 6   MAGNESIUM GLYCINATE PO Take 1 tablet by mouth in the morning.     methocarbamol (ROBAXIN) 500 MG tablet Take 1 tablet (500 mg total) by mouth every 8 (eight) hours as needed for muscle spasms. 40 tablet 0   nitroGLYCERIN (NITROSTAT) 0.4 MG SL tablet Place 1 tablet (0.4 mg total) under the tongue every 5 (five) minutes as needed for chest pain. 25 tablet 0   pantoprazole (PROTONIX) 40 MG tablet Take 1 tablet (40 mg total) by mouth daily. (Patient taking differently: Take 40 mg by mouth daily as needed (acid reflux/indigestion.).) 90 tablet 0   potassium chloride (KLOR-CON) 10 MEQ tablet Take 1 tablet (10 mEq total) by mouth daily. 90 tablet 3   albuterol (VENTOLIN HFA) 108 (90 Base) MCG/ACT inhaler Inhale 1-2 puffs into the lungs every 6 (six) hours as needed for wheezing or shortness of breath. And or cough. 1 each 0   No facility-administered medications prior to visit.    Review of Systems  Constitutional:  Negative for chills, diaphoresis, fever, malaise/fatigue and weight loss.  HENT:  Negative for congestion.   Respiratory:  Positive for cough, shortness of breath and wheezing. Negative for hemoptysis and sputum production.   Cardiovascular:  Negative for chest pain, palpitations and leg swelling.     Objective:   Vitals:   12/23/21 0905  BP: 132/68  Pulse: (!) 103  Temp: 98.2 F (36.8 C)  TempSrc: Oral  SpO2: 94%  Weight: 170 lb (77.1 kg)  Height: '5\' 2"'$  (1.575 m)   SpO2: 94 % O2 Device: None (Room air)  Physical Exam: General: Well-appearing, no acute distress HENT: [place], AT Eyes: EOMI, no scleral icterus Respiratory: Diminished but clear to auscultation bilaterally.  No crackles, wheezing or rales Cardiovascular: RRR, -M/R/G, no JVD Extremities:-Edema,-tenderness Neuro: AAO x4, CNII-XII grossly intact Psych: Normal mood, normal affect  Data Reviewed:  Imaging: CXR 11/27/21 - No acute abnormalities  PFT: None on file  Labs: CBC    Component  Value Date/Time   WBC 15.4 (H) 11/27/2021 0017   RBC 4.26 11/27/2021 0017   HGB 11.9 (L) 11/27/2021 0017   HGB 13.1 04/14/2021 1008   HCT 37.7 11/27/2021 0017   HCT 38.3 04/14/2021 1008   PLT 236 11/27/2021 0017   PLT 241 04/14/2021 1008   MCV 88.5 11/27/2021 0017   MCV 85 04/14/2021 1008   MCH 27.9 11/27/2021 0017   MCHC 31.6 11/27/2021 0017   RDW 15.6 (H) 11/27/2021 0017   RDW 13.6 04/14/2021 1008   LYMPHSABS 1.8 06/11/2021 0834   MONOABS  0.4 06/11/2021 0834   EOSABS 0.1 06/11/2021 0834   BASOSABS 0.0 06/11/2021 0834   Absolute eos 06/11/21 - 100    Assessment & Plan:   Discussion: 76 year old female active smoker, CAD s/p DES x2 to pLAD and recent DES to RCA, HTN, HLD who presents for new consult for emphysema. Discussed clinical course and management of COPD including bronchodilator regimen and action plan for exacerbation. Treating for exacerbation however her shortness may be exacerbated by underlying cardiac cause. Scheduled for cardiac cath today.  COPD exacerbation Chronic bronchitis --START prednisone 40 mg x 5 days --START azithromycin x 5 days --START Trelegy inhaler ONE puff ONCE a day. Rinse mouth after use --CONTINUE Albuterol AS NEEDED every 4-6 hours for shortness of breath, cough and wheezing. --Ambulatory O2 in-office performed. No desaturations --Plan for pulmonary function tests in future  Low-normal O2 --Consider sleep study in the future if persistent after resolution of exacerbation  Tobacco use --Encouraged smoking cessation. Currently down to 1/4 ppd  Health Maintenance Immunization History  Administered Date(s) Administered   Fluad Quad(high Dose 65+) 01/31/2019   Influenza, High Dose Seasonal PF 03/16/2018   Influenza-Unspecified 04/06/2020, 03/15/2021   PFIZER(Purple Top)SARS-COV-2 Vaccination 07/05/2019, 07/26/2019, 03/21/2020   Pneumococcal Conjugate-13 07/14/2020   Pneumococcal Polysaccharide-23 09/11/2017   CT Lung Screen - Discuss at  next visit  No orders of the defined types were placed in this encounter.  Meds ordered this encounter  Medications   Fluticasone-Umeclidin-Vilant (TRELEGY ELLIPTA) 200-62.5-25 MCG/ACT AEPB    Sig: Inhale 1 puff into the lungs daily at 2 PM.    Dispense:  60 each    Refill:  2   albuterol (VENTOLIN HFA) 108 (90 Base) MCG/ACT inhaler    Sig: Inhale 1-2 puffs into the lungs every 6 (six) hours as needed for wheezing or shortness of breath. And or cough.    Dispense:  1 each    Refill:  2   azithromycin (ZITHROMAX) 250 MG tablet    Sig: Take two tablets on day 1, then one tablet on day 2-5    Dispense:  6 tablet    Refill:  0   predniSONE (DELTASONE) 10 MG tablet    Sig: Take 4 tablets (40 mg total) by mouth daily with breakfast for 5 days.    Dispense:  20 tablet    Refill:  0    Return in about 6 weeks (around 02/03/2022).  I have spent a total time of 45-minutes on the day of the appointment reviewing prior documentation, coordinating care and discussing medical diagnosis and plan with the patient/family. Imaging, labs and tests included in this note have been reviewed and interpreted independently by me.  [place], MD [facility] 12/23/2021 9:40 AM  Office Number [phone]

## 2021-12-24 ENCOUNTER — Encounter (HOSPITAL_COMMUNITY): Payer: Self-pay | Admitting: Cardiology

## 2021-12-24 MED FILL — Verapamil HCl IV Soln 2.5 MG/ML: INTRAVENOUS | Qty: 2 | Status: AC

## 2021-12-28 ENCOUNTER — Telehealth: Payer: Self-pay

## 2021-12-28 NOTE — Telephone Encounter (Signed)
Attempted to call patient, left message for patient to call back to office.   

## 2021-12-28 NOTE — Telephone Encounter (Signed)
-----   Message from Janina Mayo, MD sent at 12/27/2021  9:11 AM EDT ----- Please let Terri [name] know that after her complete evaluation, her symptoms are not due to her heart. Her stents are open and symptoms are not related to heart failure. Continue treatment with pulmonology.  ----- Message ----- From: Martinique, Peter M, MD Sent: 12/23/2021   1:45 PM EDT To: Lendon Colonel, NP; #

## 2021-12-29 ENCOUNTER — Telehealth: Payer: Self-pay

## 2021-12-29 NOTE — Telephone Encounter (Addendum)
Called patient regarding results. Patient had understanding of results.----- Message from Lendon Colonel, NP sent at 12/22/2021  4:48 PM EDT ----- I have reviewed labs. Essentially normal. No concerns here.  KL

## 2022-01-21 ENCOUNTER — Encounter: Payer: Self-pay | Admitting: Internal Medicine

## 2022-01-21 ENCOUNTER — Ambulatory Visit (INDEPENDENT_AMBULATORY_CARE_PROVIDER_SITE_OTHER): Payer: Medicare Other | Admitting: Internal Medicine

## 2022-01-21 VITALS — BP 115/60 | HR 70 | Ht 62.0 in | Wt 170.8 lb

## 2022-01-21 DIAGNOSIS — I2510 Atherosclerotic heart disease of native coronary artery without angina pectoris: Secondary | ICD-10-CM

## 2022-01-21 NOTE — Telephone Encounter (Signed)
Patient seen in office today 8/18 by Dr. Harl Bowie. Discussed with patient at that time.

## 2022-01-21 NOTE — Progress Notes (Signed)
Cardiology Office Note:    Date:  01/21/2022   ID:  Terri [name], DOB 1945/10/02, MRN [id number]  PCP:  Libby Maw, MD   [place] [facility] Cardiologist:  Quay Burow, MD     Referring MD: Libby Maw   No chief complaint on file. Follow up  History of Present Illness:    Terri [name] is a 76 y.o. female with a hx of smoking, CAD s/p anterior stemi 01/2015 DESx2 prox LAD, recent DES to prox and mid  RCA  She brings in DC summary from [facility] She presented with palpitations and SOB to the hospital there. She also reported indigestion.. She underwent LHC, PCI of an occluded mid and prox RCA (written on  the back of her DC summary). Noted to be 99% on  a voicemail from the internationalist. Echo showed EF 40%, mild MR, no aortic stenosis or AI, no pericardial effusion. Her husband noted that her blood pressure dropped suddenly.  She had CPR. No shockable rhythm. This was March 24 , 2023.  She feels much better. She's on DAPT. Her blood pressure is well controlled. Still smoking. No PND. No orthopnea. No Le edema.   Interval Hx 12/21/2021: She went to the ED for  mid-sternal chest pain 6/24. She was given nitro. She said her arm hurt and she was diaphoretic. Her chest pain resolved. EKG did not show any ischemic changes. Her troponin was very minimal.  Today, she notes no energy. She sleeps a lot. She is fatigued with minimal activity.  She notes both sides of her chest aches. She denies orthopnea, PND. No LE edema. She has significant shortness of breath with minimal activity.  She feels congested. She is coughing up clear phlegm which is chronic. She states these symptoms are the same ones she had prior to her PCI in March and have persisted.   Interval Hx 01/21/2022 Terri [name] underwent LHC. She did not have re-stenosis. Her wedge was normal. She feels better and more energetic. She is down to 4 cigarettes  per day.   Cardiology Studies LHC  12/23/2021: non obstructive CAD, patent stents in the prox to mid LAD, ostial RCA to the PL Terri [name] of the RCA. Wedge was normal. Normal CI  Myoview-08/10/2021-large defect with moderate intensity in the apical to basal anterior, anteroseptal and apex that's fixed  06/11/2021-TTE- EF 40-45%, mildly reduced. 04/28/2021- EF 50-55, RV is normal, mild-mod MR, AI mild to moderate  Past Medical History:  Diagnosis Date   Aortic insufficiency    a. Mild AI by echo 2014.   Arthritis    CAD (coronary artery disease)    a. Anterior STEMI 01/2015 s/p overlapping DES x2 to prox LAD, DES to prox RCA in [facility].   CHF (congestive heart failure) (HCC)    Chronic back pain    stenosis/HNP   History of bronchitis 06/06/2012   History of colon polyps    Hyperlipidemia    Ischemic cardiomyopathy    Joint pain    Joint swelling    Myocardial infarction [facility])    Pneumonia 06/06/2012   Smokers' cough (HCC)    Stress headaches    Tobacco abuse     Past Surgical History:  Procedure Laterality Date   ABDOMINAL HYSTERECTOMY     at age 89   BREAST BIOPSY Right    roughly 30 years ago with [facility]    cataract surgery Bilateral    CHOLECYSTECTOMY  colonscopy     ERCP     ESOPHAGOGASTRODUODENOSCOPY     LUMBAR LAMINECTOMY Right 07/25/2014   Procedure: Right L4 Hemilaminectomy, Right L4-5 Laminotomy , Decompression, Microdisectomy;  Surgeon: Marybelle Killings, MD;  Location: [place];  Service: Orthopedics;  Laterality: Right;   RIGHT/LEFT HEART CATH AND CORONARY ANGIOGRAPHY N/A 12/23/2021   Procedure: RIGHT/LEFT HEART CATH AND CORONARY ANGIOGRAPHY;  Surgeon: Martinique, Peter M, MD;  Location: [place] [facility];  Service: Cardiovascular;  Laterality: N/A;   SHOULDER SURGERY Right    TONSILLECTOMY     as a child    Current Medications: Current Outpatient Medications on File Prior to Visit  Medication Sig Dispense Refill   albuterol (VENTOLIN HFA) 108 (90 Base) MCG/ACT inhaler Inhale 1-2 puffs  into the lungs every 6 (six) hours as needed for wheezing or shortness of breath. And or cough. 1 each 2   atorvastatin (LIPITOR) 80 MG tablet Take 1 tablet (80 mg total) by mouth daily. (Patient taking differently: Take 80 mg by mouth at bedtime.) 90 tablet 3   carvedilol (COREG) 6.25 MG tablet Take 1 tablet (6.25 mg total) by mouth 2 (two) times daily with a meal. TAKE 1 TABLET BY MOUTH TWICE DAILY 60 tablet 6   clopidogrel (PLAVIX) 75 MG tablet Take 1 tablet (75 mg total) by mouth daily. 90 tablet 3   [facility] ASPIRIN 81 MG EC tablet Take 81 mg by mouth in the morning.  0   empagliflozin (JARDIANCE) 10 MG TABS tablet Take 1 tablet (10 mg total) by mouth daily before breakfast. 30 tablet 3   Fluticasone-Umeclidin-Vilant (TRELEGY ELLIPTA) 200-62.5-25 MCG/ACT AEPB Inhale 1 puff into the lungs daily at 2 PM. 60 each 2   furosemide (LASIX) 40 MG tablet Take 1.5 tablets (60 mg total) by mouth daily. 45 tablet 3   lisinopril (ZESTRIL) 40 MG tablet Take 1 tablet (40 mg total) by mouth daily. 30 tablet 6   nitroGLYCERIN (NITROSTAT) 0.4 MG SL tablet Place 1 tablet (0.4 mg total) under the tongue every 5 (five) minutes as needed for chest pain. 25 tablet 0   pantoprazole (PROTONIX) 40 MG tablet Take 1 tablet (40 mg total) by mouth daily. (Patient taking differently: Take 40 mg by mouth daily as needed (acid reflux/indigestion.).) 90 tablet 0   potassium chloride (KLOR-CON) 10 MEQ tablet Take 1 tablet (10 mEq total) by mouth daily. 90 tablet 3   No current facility-administered medications on file prior to visit.     Allergies:   Erythromycin   Social History   Socioeconomic History   Marital status: Married    Spouse name: Maelle Sheaffer   Number of children: Not on file   Years of education: Not on file   Highest education level: Not on file  Occupational History   Occupation: retired  Tobacco Use   Smoking status: Every Day    Packs/day: 0.50    Years: 60.00    Total pack years: 30.00    Types:  Cigarettes   Smokeless tobacco: Never  Vaping Use   Vaping Use: Never used  Substance and Sexual Activity   Alcohol use: No   Drug use: No   Sexual activity: Yes    Birth control/protection: Surgical  Other Topics Concern   Not on file  Social History Narrative   Not on file   Social Determinants of Health   Financial Resource Strain: Low Risk  (10/13/2021)   Overall Financial Resource Strain (CARDIA)    Difficulty of  Paying Living Expenses: Not hard at all  Food Insecurity: No Food Insecurity (10/13/2021)   Hunger Vital Sign    Worried About Running Out of Food in the Last Year: Never true    Ran Out of Food in the Last Year: Never true  Transportation Needs: No Transportation Needs (10/13/2021)   PRAPARE - Hydrologist (Medical): No    Lack of Transportation (Non-Medical): No  Physical Activity: Inactive (10/13/2021)   Exercise Vital Sign    Days of Exercise per Week: 0 days    Minutes of Exercise per Session: 0 min  Stress: No Stress Concern Present (10/13/2021)   [place]    Feeling of Stress : Not at all  Social Connections: Moderately Integrated (10/13/2021)   Social Connection and Isolation Panel [NHANES]    Frequency of Communication with Friends and Family: Three times a week    Frequency of Social Gatherings with Friends and Family: Three times a week    Attends Religious Services: More than 4 times per year    Active Member of Clubs or Organizations: No    Attends Archivist Meetings: Never    Marital Status: Married     Family History: The patient's family history includes Breast cancer in her sister; CAD in an other family member; Heart disease in her father and mother; Heart failure in her father and mother.  ROS:   Please see the history of present illness.     All other systems reviewed and are negative.  EKGs/Labs/Other Studies Reviewed:    The  following studies were reviewed today:   EKG:  EKG is  ordered today.  The ekg ordered today demonstrates   EKG 09/17/2021 - NSR, minimal LVH  EKG: 01/21/2022- NSR, LAD, LVH, septal Q  Recent Labs: 06/11/2021: TSH 1.402 06/16/2021: B Natriuretic Peptide 300.4 07/29/2021: ALT 14 11/27/2021: Platelets 236 12/20/2021: BUN 14; Creatinine, Ser 0.89; Magnesium 2.0 12/23/2021: Hemoglobin 9.9; Potassium 3.3; Sodium 130   Recent Lipid Panel    Component Value Date/Time   CHOL 112 07/29/2021 1507   CHOL 133 08/14/2020 1010   TRIG 148.0 07/29/2021 1507   HDL 39.10 07/29/2021 1507   HDL 41 08/14/2020 1010   CHOLHDL 3 07/29/2021 1507   VLDL 29.6 07/29/2021 1507   LDLCALC 44 07/29/2021 1507   LDLCALC 63 08/14/2020 1010     Risk Assessment/Calculations:           Physical Exam:    VS:  BP 115/60   Pulse 70   Ht '5\' 2"'$  (1.575 m)   Wt 170 lb 12.8 oz (77.5 kg)   SpO2 97%   BMI 31.24 kg/m     Wt Readings from Last 3 Encounters:  01/21/22 170 lb 12.8 oz (77.5 kg)  12/23/21 170 lb (77.1 kg)  12/21/21 171 lb 9.6 oz (77.8 kg)     GEN:  Well nourished, well developed in no acute distress HEENT: Normal NECK: No JVD;  LYMPHATICS: No lymphadenopathy CARDIAC: RRR, no murmurs, rubs, gallops RESPIRATORY:  scant + expiratory wheezes ABDOMEN: Soft, non-tender, non-distended MUSCULOSKELETAL:  No edema; No deformity  Vasc: Right radial 2+ SKIN: Warm and dry NEUROLOGIC:  Alert and oriented x 3 PSYCHIATRIC:  Normal affect   ASSESSMENT:    Ischemic cardiomyopathy NYHA Class III: RK27-06.  s/p anterior stemi 01/2015 DESx2 prox LAD, recent DES to prox and mid  RCA 99% s/p PCI 08/27/2021.  Stents are patent.  Her wedge was normal with LHC  Continue jardiance 10 mg daily. Continue asa and plavix ( plavix can be stopped 9/24 after 6 mo; will continue as she tolerates)  DOE: With normal wedge and patent stents. Her symptoms are related to her COPD. Sees pulmonology  HTN: continue lisinopril 40 mg  daily and coreg 6.25 mg BID.   HLD:  Continue atorvastatin 80 mg daily . LDL 44 07/29/2021  Smoking Cessation: she is cutting back. Congratulated her  PLAN:    In order of problems listed above:  Follow up 6 mo        Medication Adjustments/Labs and Tests Ordered: Current medicines are reviewed at length with the patient today.  Concerns regarding medicines are outlined above.  No orders of the defined types were placed in this encounter.  No orders of the defined types were placed in this encounter.   There are no Patient Instructions on file for this visit.   Signed, Janina Mayo, MD  01/21/2022 8:41 AM    [facility]

## 2022-01-21 NOTE — Patient Instructions (Signed)
Medication Instructions:  No Changes In Medications at this time.  *If you need a refill on your cardiac medications before your next appointment, please call your pharmacy*  Lab Work: None Ordered At This Time.  If you have labs (blood work) drawn today and your tests are completely normal, you will receive your results only by: [place] (if you have MyChart) OR A paper copy in the mail If you have any lab test that is abnormal or we need to change your treatment, we will call you to review the results.  Testing/Procedures: None Ordered At This Time.   Follow-Up: At [facility], you and your health needs are our priority.  As part of our continuing mission to provide you with exceptional heart care, we have created designated Provider Care Teams.  These Care Teams include your primary Cardiologist (physician) and Advanced Practice Providers (APPs -  Physician Assistants and Nurse Practitioners) who all work together to provide you with the care you need, when you need it.  We recommend signing up for the patient portal called "MyChart".  Sign up information is provided on this After Visit Summary.  MyChart is used to connect with patients for Virtual Visits (Telemedicine).  Patients are able to view lab/test results, encounter notes, upcoming appointments, etc.  Non-urgent messages can be sent to your provider as well.   To learn more about what you can do with MyChart, go to [email or web address].    Your next appointment:   6 month(s)  The format for your next appointment:   In Person  Provider: Dr. Harl Bowie

## 2022-01-31 ENCOUNTER — Telehealth: Payer: Self-pay | Admitting: Pulmonary Disease

## 2022-01-31 ENCOUNTER — Ambulatory Visit (INDEPENDENT_AMBULATORY_CARE_PROVIDER_SITE_OTHER): Payer: Medicare Other | Admitting: Pulmonary Disease

## 2022-01-31 ENCOUNTER — Other Ambulatory Visit (HOSPITAL_COMMUNITY): Payer: Self-pay

## 2022-01-31 ENCOUNTER — Encounter: Payer: Self-pay | Admitting: Pulmonary Disease

## 2022-01-31 VITALS — BP 122/60 | HR 76 | Ht 62.0 in | Wt 171.8 lb

## 2022-01-31 DIAGNOSIS — J42 Unspecified chronic bronchitis: Secondary | ICD-10-CM

## 2022-01-31 MED ORDER — FLUTICASONE-SALMETEROL 250-50 MCG/ACT IN AEPB: 1.0000 | INHALATION_SPRAY | Freq: Two times a day (BID) | RESPIRATORY_TRACT | 5 refills | Status: DC

## 2022-01-31 MED ORDER — FLUTICASONE-UMECLIDIN-VILANT 200-62.5-25 MCG/ACT IN AEPB: 1.0000 | INHALATION_SPRAY | Freq: Every day | RESPIRATORY_TRACT | 0 refills | Status: DC

## 2022-01-31 NOTE — Progress Notes (Addendum)
Subjective:   PATIENT ID: Terri [name] GENDER: female DOB: [birth year], MRN: [id number]   HPI  Chief Complaint  Patient presents with   Follow-up    Feeling better has energy back    Reason for Visit: Follow-up  [name] is a 76 year old female active smoker, CAD s/p DES x2 to pLAD and recent DES to RCA, HTN, HLD who presents for follow-up for emphysema.  Initial consult She has had long standing shortness of breath that has worsened in the last 3-4 weeks. Husband states she her breathing is labored and occurs with exertion. Her home was recently undergoing construction with new flooring and increased dust in the home. Unable to walk across the room and increased productive cough with clear phlegm during the day and night. Has had decreased energy and is sleeping more often during the day and night. Associated with wheezing. Previously she was walking often and going golfing a month ago. She is taking mucinex 3-4 days last week.  She was recently seen in the ED for chest pain and shortness of breath. on 6/24. EKG and troponin unremarkable. Followed up with Cardiology on 7/18 with significant shortness of breath and chronic productive cough. Her lasix was increased to 60 mg daily and scheduled for LHC/RHC later today.  01/31/22 Since our last visit she was treated for COPD exacerbation in 12/2021. She was started on Trelegy and reports her baseline symptoms of shortness of breath, wheezing have overall improved. She feels she has improved. Expresses concern about being to able to afford her medications for her heart and lungs. Continues to smoke.  Social History: Active smoker. Started in early teens. Previously 1 ppd. Currently 1/4 ppd.  Past Medical History:  Diagnosis Date   Aortic insufficiency    a. Mild AI by echo 2014.   Arthritis    CAD (coronary artery disease)    a. Anterior STEMI 01/2015 s/p overlapping DES x2 to prox LAD, DES to prox RCA in [facility].   CHF (congestive heart failure) (HCC)    Chronic back pain    stenosis/HNP   History of bronchitis 06/06/2012   History of colon polyps    Hyperlipidemia    Ischemic cardiomyopathy    Joint pain    Joint swelling    Myocardial infarction [facility])    Pneumonia 06/06/2012   Smokers' cough (HCC)    Stress headaches    Tobacco abuse      Family History  Problem Relation Age of Onset   Heart disease Mother    Heart failure Mother    Heart disease Father    Heart failure Father    CAD Other        both parents and multiple siblings with either died or had-related issues   Breast cancer Sister      Social History   Occupational History   Occupation: retired  Tobacco Use   Smoking status: Every Day    Packs/day: 0.50    Years: 60.00    Total pack years: 30.00    Types: Cigarettes   Smokeless tobacco: Never  Vaping Use   Vaping Use: Never used  Substance and Sexual Activity   Alcohol use: No   Drug use: No   Sexual activity: Yes    Birth control/protection: Surgical    Allergies  Allergen Reactions   Erythromycin Nausea Only     Outpatient Medications Prior to Visit  Medication Sig Dispense Refill  albuterol (VENTOLIN HFA) 108 (90 Base) MCG/ACT inhaler Inhale 1-2 puffs into the lungs every 6 (six) hours as needed for wheezing or shortness of breath. And or cough. 1 each 2   atorvastatin (LIPITOR) 80 MG tablet Take 1 tablet (80 mg total) by mouth daily. (Patient taking differently: Take 80 mg by mouth at bedtime.) 90 tablet 3   carvedilol (COREG) 6.25 MG tablet Take 1 tablet (6.25 mg total) by mouth 2 (two) times daily with a meal. TAKE 1 TABLET BY MOUTH TWICE DAILY 60 tablet 6   clopidogrel (PLAVIX) 75 MG tablet Take 1 tablet (75 mg total) by mouth daily. 90 tablet 3   [employer] ASPIRIN 81 MG EC tablet Take 81 mg by mouth in the morning.  0   empagliflozin (JARDIANCE) 10 MG TABS tablet Take 1 tablet (10 mg total) by mouth daily before breakfast. 30 tablet 3    Fluticasone-Umeclidin-Vilant (TRELEGY ELLIPTA) 200-62.5-25 MCG/ACT AEPB Inhale 1 puff into the lungs daily at 2 PM. 60 each 2   furosemide (LASIX) 40 MG tablet Take 1.5 tablets (60 mg total) by mouth daily. 45 tablet 3   lisinopril (ZESTRIL) 40 MG tablet Take 1 tablet (40 mg total) by mouth daily. 30 tablet 6   pantoprazole (PROTONIX) 40 MG tablet Take 1 tablet (40 mg total) by mouth daily. (Patient taking differently: Take 40 mg by mouth daily as needed (acid reflux/indigestion.).) 90 tablet 0   potassium chloride (KLOR-CON) 10 MEQ tablet Take 1 tablet (10 mEq total) by mouth daily. 90 tablet 3   nitroGLYCERIN (NITROSTAT) 0.4 MG SL tablet Place 1 tablet (0.4 mg total) under the tongue every 5 (five) minutes as needed for chest pain. (Patient not taking: Reported on 01/31/2022) 25 tablet 0   No facility-administered medications prior to visit.    Review of Systems  Constitutional:  Negative for chills, diaphoresis, fever, malaise/fatigue and weight loss.  HENT:  Negative for congestion.   Respiratory:  Positive for cough, shortness of breath and wheezing. Negative for hemoptysis and sputum production.   Cardiovascular:  Negative for chest pain, palpitations and leg swelling.     Objective:   Vitals:   01/31/22 0936  BP: 122/60  Pulse: 76  SpO2: 98%  Weight: 171 lb 12.8 oz (77.9 kg)  Height: '5\' 2"'$  (1.575 m)   SpO2: 98 % O2 Device: None (Room air)  Physical Exam: General: Well-appearing, no acute distress HENT: [place], AT Eyes: EOMI, no scleral icterus Respiratory: Clear to auscultation bilaterally.  No crackles, wheezing or rales Cardiovascular: RRR, -M/R/G, no JVD Extremities:-Edema,-tenderness Neuro: AAO x4, CNII-XII grossly intact Psych: Normal mood, normal affect  Data Reviewed:  Imaging: CXR 11/27/21 - No acute abnormalities  PFT: None on file  Labs: CBC    Component Value Date/Time   WBC 15.4 (H) 11/27/2021 0017   RBC 4.26 11/27/2021 0017   HGB 9.9 (L) 12/23/2021  1323   HGB 13.1 04/14/2021 1008   HCT 29.0 (L) 12/23/2021 1323   HCT 38.3 04/14/2021 1008   PLT 236 11/27/2021 0017   PLT 241 04/14/2021 1008   MCV 88.5 11/27/2021 0017   MCV 85 04/14/2021 1008   MCH 27.9 11/27/2021 0017   MCHC 31.6 11/27/2021 0017   RDW 15.6 (H) 11/27/2021 0017   RDW 13.6 04/14/2021 1008   LYMPHSABS 1.8 06/11/2021 0834   MONOABS 0.4 06/11/2021 0834   EOSABS 0.1 06/11/2021 0834   BASOSABS 0.0 06/11/2021 0834   Absolute eos 06/11/21 - 100    Assessment &  Plan:   Discussion: 76 year old female active smoker, CAD s/p DES x2 to pLAD and recent DES to RCA, HTN, HLD who presents for new consult for emphysema. Discussed clinical course and management of COPD including bronchodilator regimen and action plan for exacerbation. Treating for exacerbation however her shortness may be exacerbated by underlying cardiac cause. Scheduled for cardiac cath today.  76 year old female active smoker, CAD s/p DES x 2 to pLAD and RCA, HTN and HLD who presents for follow-up. Improved symptoms on triple therapy. Last exacerbation 12/2021. Discussed clinical course and management of COPD including bronchodilator regimen and action plan for exacerbation.  Emphysema Chronic bronchitis --CONTINUE Trelegy 200 ONE puff ONCE a day. Rinse mouth after use --CONTINUE Albuterol AS NEEDED every 4-6 hours for shortness of breath, cough and wheezing. --ORDER pulmonary function tests prior to next visit  ADDENDUM 5:43 PM: Test scripts confirmed that [facility] is the cheapest alternative at $28.51. Will order Wixela 250-50 mcg ONE puff TWICE a day.  Low-normal O2 - normalized on this visit --No action  Tobacco use --Encouraged smoking cessation. Currently down to 1/4 ppd  Health Maintenance Immunization History  Administered Date(s) Administered   Fluad Quad(high Dose 65+) 01/31/2019   Influenza, High Dose Seasonal PF 03/16/2018   Influenza-Unspecified 04/06/2020, 03/15/2021   PFIZER(Purple  Top)SARS-COV-2 Vaccination 07/05/2019, 07/26/2019, 03/21/2020   Pneumococcal Conjugate-13 07/14/2020   Pneumococcal Polysaccharide-23 09/11/2017   CT Lung Screen - Discuss at next visit  Orders Placed This Encounter  Procedures   Pulmonary function test    Standing Status:   Future    Standing Expiration Date:   02/01/2023    Scheduling Instructions:     To be completed at [facility] same day as ov    Order Specific Question:   Where should this test be performed?    Answer:   [facility]    Order Specific Question:   Full PFT: includes the following: basic spirometry, spirometry pre & post bronchodilator, diffusion capacity (DLCO), lung volumes    Answer:   Full PFT   Meds ordered this encounter  Medications   Fluticasone-Umeclidin-Vilant 200-62.5-25 MCG/ACT AEPB    Sig: Inhale 1 puff into the lungs daily.    Dispense:  28 each    Refill:  0    Order Specific Question:   Manufacturer?    Answer:   [employer] [[id number]]    Return in about 3 months (around 05/03/2022).  I have spent a total time of 32-minutes on the day of the appointment including chart review, data review, collecting history, coordinating care and discussing medical diagnosis and plan with the patient/family. Past medical history, allergies, medications were reviewed. Pertinent imaging, labs and tests included in this note have been reviewed and interpreted independently by me.  [place], MD [facility] 01/31/2022 10:18 AM  Office Number [phone]

## 2022-01-31 NOTE — Telephone Encounter (Signed)
Thank you! Appreciate your time and patient was very happy as well!

## 2022-01-31 NOTE — Telephone Encounter (Signed)
Please price check patient's inhalers for Trelegy? Is it cheaper to order 3 month supply?  Alternatives could be Wixela 250 or Advair 250 or Breo 200

## 2022-01-31 NOTE — Patient Instructions (Signed)
Emphysema Chronic bronchitis --CONTINUE Trelegy 200 ONE puff ONCE a day. Rinse mouth after use --CONTINUE Albuterol AS NEEDED every 4-6 hours for shortness of breath, cough and wheezing. --ORDER pulmonary function tests prior to next visit  Follow-up with me in 3 months with PFTs prior to visit

## 2022-01-31 NOTE — Addendum Note (Signed)
Addended by: Rodman Pickle on: 01/31/2022 05:46 PM   Modules accepted: Orders

## 2022-02-08 ENCOUNTER — Ambulatory Visit: Payer: Medicare Other | Admitting: Pulmonary Disease

## 2022-03-21 ENCOUNTER — Telehealth: Payer: Self-pay | Admitting: Internal Medicine

## 2022-03-21 MED ORDER — LISINOPRIL 40 MG PO TABS
40.0000 mg | ORAL_TABLET | Freq: Every day | ORAL | 6 refills | Status: DC
Start: 2022-03-21 — End: 2022-09-14

## 2022-03-21 NOTE — Telephone Encounter (Signed)
*  STAT* If patient is at the pharmacy, call can be transferred to refill team.   1. Which medications need to be refilled? (please list name of each medication and dose if known)   lisinopril (ZESTRIL) 40 MG tablet  2. Which pharmacy/location (including street and city if local pharmacy) is medication to be sent to?    [place], [place] [address]  3. Do they need a 30 day or 90 day supply?  90 day  Patient stated she is completely out of this medication.

## 2022-05-18 ENCOUNTER — Ambulatory Visit (INDEPENDENT_AMBULATORY_CARE_PROVIDER_SITE_OTHER): Payer: Medicare Other | Admitting: Pulmonary Disease

## 2022-05-18 ENCOUNTER — Encounter (HOSPITAL_BASED_OUTPATIENT_CLINIC_OR_DEPARTMENT_OTHER): Payer: Self-pay | Admitting: Pulmonary Disease

## 2022-05-18 VITALS — BP 132/68 | Ht 63.0 in | Wt 173.6 lb

## 2022-05-18 DIAGNOSIS — J42 Unspecified chronic bronchitis: Secondary | ICD-10-CM | POA: Diagnosis not present

## 2022-05-18 DIAGNOSIS — J432 Centrilobular emphysema: Secondary | ICD-10-CM | POA: Diagnosis not present

## 2022-05-18 LAB — PULMONARY FUNCTION TEST
DL/VA % pred: 95 %
DL/VA: 3.94 ml/min/mmHg/L
DLCO cor % pred: 72 %
DLCO cor: 13.31 ml/min/mmHg
DLCO unc % pred: 72 %
DLCO unc: 13.31 ml/min/mmHg
FEF 25-75 Post: 1.06 L/sec
FEF 25-75 Pre: 0.95 L/sec
FEF2575-%Change-Post: 11 %
FEF2575-%Pred-Post: 66 %
FEF2575-%Pred-Pre: 59 %
FEV1-%Change-Post: 3 %
FEV1-%Pred-Post: 70 %
FEV1-%Pred-Pre: 68 %
FEV1-Post: 1.42 L
FEV1-Pre: 1.38 L
FEV1FVC-%Change-Post: 2 %
FEV1FVC-%Pred-Pre: 96 %
FEV6-%Change-Post: 0 %
FEV6-%Pred-Post: 74 %
FEV6-%Pred-Pre: 74 %
FEV6-Post: 1.91 L
FEV6-Pre: 1.9 L
FEV6FVC-%Change-Post: 0 %
FEV6FVC-%Pred-Post: 105 %
FEV6FVC-%Pred-Pre: 105 %
FVC-%Change-Post: 0 %
FVC-%Pred-Post: 70 %
FVC-%Pred-Pre: 70 %
FVC-Post: 1.91 L
FVC-Pre: 1.9 L
Post FEV1/FVC ratio: 74 %
Post FEV6/FVC ratio: 100 %
Pre FEV1/FVC ratio: 72 %
Pre FEV6/FVC Ratio: 100 %
RV % pred: 110 %
RV: 2.47 L
TLC % pred: 92 %
TLC: 4.52 L

## 2022-05-18 MED ORDER — FLUTICASONE-SALMETEROL 250-50 MCG/ACT IN AEPB: 1.0000 | INHALATION_SPRAY | Freq: Two times a day (BID) | RESPIRATORY_TRACT | 5 refills | Status: AC

## 2022-05-18 NOTE — Progress Notes (Signed)
Subjective:   PATIENT ID: Terri [name] GENDER: female DOB: 01/12/1946, MRN: [id number]   HPI  Chief Complaint  Patient presents with   Follow-up    Pft results    Reason for Visit: Follow-up  Terri [name] is a 76 year old female active smoker, CAD s/p DES x2 to pLAD and recent DES to RCA, HTN, HLD who presents for follow-up for emphysema.  Initial consult She has had long standing shortness of breath that has worsened in the last 3-4 weeks. Husband states she her breathing is labored and occurs with exertion. Her home was recently undergoing construction with new flooring and increased dust in the home. Unable to walk across the room and increased productive cough with clear phlegm during the day and night. Has had decreased energy and is sleeping more often during the day and night. Associated with wheezing. Previously she was walking often and going golfing a month ago. She is taking mucinex 3-4 days last week.  She was recently seen in the ED for chest pain and shortness of breath. on 6/24. EKG and troponin unremarkable. Followed up with Cardiology on 7/18 with significant shortness of breath and chronic productive cough. Her lasix was increased to 60 mg daily and scheduled for LHC/RHC later today.  01/31/22 Since our last visit she was treated for COPD exacerbation in 12/2021. She was started on Trelegy and reports her baseline symptoms of shortness of breath, wheezing have overall improved. She feels she has improved. Expresses concern about being to able to afford her medications for her heart and lungs. Continues to smoke.  05/18/22 Since our last visit she reports symptoms well-controlled. Currently smoking to 1/4-1/2 ppd. Reports baseline coughing and wheezing. Compliant with Wixela. Takes mucinex which helps.   Social History: Active smoker. Started in early teens. Previously 1 ppd. Currently 1/4 ppd.  Past Medical History:  Diagnosis Date   Aortic insufficiency    a.  Mild AI by echo 2014.   Arthritis    CAD (coronary artery disease)    a. Anterior STEMI 01/2015 s/p overlapping DES x2 to prox LAD, DES to prox RCA in [facility].   CHF (congestive heart failure) (HCC)    Chronic back pain    stenosis/HNP   History of bronchitis 06/06/2012   History of colon polyps    Hyperlipidemia    Ischemic cardiomyopathy    Joint pain    Joint swelling    Myocardial infarction [facility])    Pneumonia 06/06/2012   Smokers' cough (HCC)    Stress headaches    Tobacco abuse      Family History  Problem Relation Age of Onset   Heart disease Mother    Heart failure Mother    Heart disease Father    Heart failure Father    CAD Other        both parents and multiple siblings with either died or had-related issues   Breast cancer Sister      Social History   Occupational History   Occupation: retired  Tobacco Use   Smoking status: Every Day    Packs/day: 0.50    Years: 60.00    Total pack years: 30.00    Types: Cigarettes   Smokeless tobacco: Never  Vaping Use   Vaping Use: Never used  Substance and Sexual Activity   Alcohol use: No   Drug use: No   Sexual activity: Yes    Birth control/protection: Surgical  Allergies  Allergen Reactions   Erythromycin Nausea Only     Outpatient Medications Prior to Visit  Medication Sig Dispense Refill   albuterol (VENTOLIN HFA) 108 (90 Base) MCG/ACT inhaler Inhale 1-2 puffs into the lungs every 6 (six) hours as needed for wheezing or shortness of breath. And or cough. 1 each 2   atorvastatin (LIPITOR) 80 MG tablet Take 1 tablet (80 mg total) by mouth daily. (Patient taking differently: Take 80 mg by mouth at bedtime.) 90 tablet 3   carvedilol (COREG) 6.25 MG tablet Take 1 tablet (6.25 mg total) by mouth 2 (two) times daily with a meal. TAKE 1 TABLET BY MOUTH TWICE DAILY 60 tablet 6   clopidogrel (PLAVIX) 75 MG tablet Take 1 tablet (75 mg total) by mouth daily. 90 tablet 3   [employer] ASPIRIN 81 MG EC  tablet Take 81 mg by mouth in the morning.  0   fluticasone-salmeterol (WIXELA INHUB) 250-50 MCG/ACT AEPB Inhale 1 puff into the lungs in the morning and at bedtime. 60 each 5   furosemide (LASIX) 40 MG tablet Take 1.5 tablets (60 mg total) by mouth daily. 45 tablet 3   lisinopril (ZESTRIL) 40 MG tablet Take 1 tablet (40 mg total) by mouth daily. 30 tablet 6   nitroGLYCERIN (NITROSTAT) 0.4 MG SL tablet Place 1 tablet (0.4 mg total) under the tongue every 5 (five) minutes as needed for chest pain. 25 tablet 0   pantoprazole (PROTONIX) 40 MG tablet Take 1 tablet (40 mg total) by mouth daily. (Patient taking differently: Take 40 mg by mouth daily as needed (acid reflux/indigestion.).) 90 tablet 0   potassium chloride (KLOR-CON) 10 MEQ tablet Take 1 tablet (10 mEq total) by mouth daily. 90 tablet 3   empagliflozin (JARDIANCE) 10 MG TABS tablet Take 1 tablet (10 mg total) by mouth daily before breakfast. (Patient not taking: Reported on 05/18/2022) 30 tablet 3   No facility-administered medications prior to visit.    Review of Systems  Constitutional:  Negative for chills, diaphoresis, fever, malaise/fatigue and weight loss.  HENT:  Negative for congestion.   Respiratory:  Positive for cough, shortness of breath and wheezing. Negative for hemoptysis and sputum production.   Cardiovascular:  Negative for chest pain, palpitations and leg swelling.     Objective:   Vitals:   05/18/22 1547  BP: 132/68  SpO2: 95%  Weight: 173 lb 9.6 oz (78.7 kg)  Height: '5\' 3"'$  (1.6 m)   SpO2: 95 % O2 Device: None (Room air)  Physical Exam: General: Well-appearing, no acute distress HENT: [place], AT Eyes: EOMI, no scleral icterus Respiratory: Clear to auscultation bilaterally.  No crackles, wheezing or rales Cardiovascular: RRR, -M/R/G, no JVD Extremities:-Edema,-tenderness Neuro: AAO x4, CNII-XII grossly intact Psych: Normal mood, normal affect  Data Reviewed:  Imaging: CXR 11/27/21 - No acute  abnormalities  PFT: 05/18/22 FVC 1.91 (70%) FEV1 1.42 (70%) Ratio 72  TLC 92% DLCO 72% Interpretation: Normal spirometry with mildly reduced gas exchange  Labs: CBC    Component Value Date/Time   WBC 15.4 (H) 11/27/2021 0017   RBC 4.26 11/27/2021 0017   HGB 9.9 (L) 12/23/2021 1323   HGB 13.1 04/14/2021 1008   HCT 29.0 (L) 12/23/2021 1323   HCT 38.3 04/14/2021 1008   PLT 236 11/27/2021 0017   PLT 241 04/14/2021 1008   MCV 88.5 11/27/2021 0017   MCV 85 04/14/2021 1008   MCH 27.9 11/27/2021 0017   MCHC 31.6 11/27/2021 0017   RDW 15.6 (H) 11/27/2021  0017   RDW 13.6 04/14/2021 1008   LYMPHSABS 1.8 06/11/2021 0834   MONOABS 0.4 06/11/2021 0834   EOSABS 0.1 06/11/2021 0834   BASOSABS 0.0 06/11/2021 0834   Absolute eos 06/11/21 - 100    Assessment & Plan:   Discussion: 75 year old female active smoker, CAD s/p DES x 2 to pLAD and RCA, HTN and HLD who presents for follow-up. Stable symptoms on ICS/LABA. Previously on triple therapy but had to de-escalate due to cost. Last exacerbation 12/2021.  Discussed clinical course and management of COPD including bronchodilator regimen and action plan for exacerbation.   Emphysema Chronic bronchitis --CONTINUE Wixela 250-50 ONE puff in the morning and evening. Rinse mouth after use --CONTINUE Albuterol AS NEEDED every 4-6 hours for shortness of breath, cough and wheezing. --Reviewed PFTs. Normal spirometry and reduced DLCO  Tobacco use --Encouraged to quit. Currently down to 1/4-1/2 ppd  Goals of Care --Has not completed a living will. She would be willing for limited life support but not prolonged dependence  --Encouraged to discuss with her family  Health Maintenance Immunization History  Administered Date(s) Administered   Fluad Quad(high Dose 65+) 01/31/2019   Influenza, High Dose Seasonal PF 03/16/2018   Influenza-Unspecified 04/06/2020, 03/15/2021   PFIZER(Purple Top)SARS-COV-2 Vaccination 07/05/2019, 07/26/2019, 03/21/2020    Pneumococcal Conjugate-13 07/14/2020   Pneumococcal Polysaccharide-23 09/11/2017   CT Lung Screen - Discuss at next visit  No orders of the defined types were placed in this encounter.  Meds ordered this encounter  Medications   fluticasone-salmeterol (WIXELA INHUB) 250-50 MCG/ACT AEPB    Sig: Inhale 1 puff into the lungs in the morning and at bedtime.    Dispense:  60 each    Refill:  5    Return in about 6 months (around 11/17/2022).  I have spent a total time of 31-minutes on the day of the appointment including chart review, data review, collecting history, coordinating care and discussing medical diagnosis and plan with the patient/family. Past medical history, allergies, medications were reviewed. Pertinent imaging, labs and tests included in this note have been reviewed and interpreted independently by me.   [place], MD [facility] 05/18/2022 3:51 PM  Office Number [phone]

## 2022-05-18 NOTE — Progress Notes (Signed)
Full PFT Performed Today  

## 2022-05-18 NOTE — Patient Instructions (Signed)
Emphysema Chronic bronchitis --CONTINUE Wixela 250-50 ONE puff in the morning and evening. Rinse mouth after use --CONTINUE Albuterol AS NEEDED every 4-6 hours for shortness of breath, cough and wheezing. --Reviewed PFTs. Normal spirometry and reduced DLCO  Tobacco use --Encouraged to quit. Currently down to 1/4-1/2 ppd  Goals of Care --Has not completed a living will. She would be willing for limited life support but not prolonged dependence  --Encouraged to discuss with her family  Follow-up with me in 6 months

## 2022-05-18 NOTE — Patient Instructions (Signed)
Full PFT Performed Today  

## 2022-05-23 ENCOUNTER — Encounter (HOSPITAL_BASED_OUTPATIENT_CLINIC_OR_DEPARTMENT_OTHER): Payer: Self-pay | Admitting: Pulmonary Disease

## 2022-06-27 ENCOUNTER — Encounter: Payer: Self-pay | Admitting: Family Medicine

## 2022-06-27 ENCOUNTER — Ambulatory Visit (INDEPENDENT_AMBULATORY_CARE_PROVIDER_SITE_OTHER): Payer: Medicare Other | Admitting: Family Medicine

## 2022-06-27 VITALS — BP 134/68 | HR 74 | Temp 97.3°F | Ht 63.0 in | Wt 174.0 lb

## 2022-06-27 DIAGNOSIS — J0100 Acute maxillary sinusitis, unspecified: Secondary | ICD-10-CM | POA: Diagnosis not present

## 2022-06-27 DIAGNOSIS — J40 Bronchitis, not specified as acute or chronic: Secondary | ICD-10-CM | POA: Diagnosis not present

## 2022-06-27 MED ORDER — BENZONATATE 200 MG PO CAPS: 200.0000 mg | ORAL_CAPSULE | Freq: Two times a day (BID) | ORAL | 0 refills | Status: DC | PRN

## 2022-06-27 MED ORDER — AMOXICILLIN-POT CLAVULANATE 875-125 MG PO TABS: 1.0000 | ORAL_TABLET | Freq: Two times a day (BID) | ORAL | 0 refills | Status: DC

## 2022-06-27 NOTE — Progress Notes (Signed)
[facility] [address] [place] [address] Dept: [phone] Dept Fax: [phone]  Office Visit  Subjective:    Patient ID: Terri [name], female    DOB: [birth year], 77 y.o..   MRN: [id number]  Chief Complaint  Patient presents with   Acute Visit    Running nose, productive cough, congestion for 2.5 weeks    History of Present Illness:  Patient is in today with a  2 /2 week his history of cough productive of mucous, nasal congestion and sinus pressure. She has been concerned that this is just not resolving very quickly for her. Her husband was recently in the hospital with pneumonia. She has not been running fever. She has a history of COPD and is on Wixela daily and albuterol as needed. She continues to smoke 1/4-1/2 ppd.  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/2019   Rash 06/26/2019   Thyroid mass 01/31/2019   Need for influenza vaccination 01/31/2019   Bilateral asymmetric sensorineural hearing loss 04/23/2018   Presbycusis of right ear 03/29/2018   PVD (peripheral vascular disease) ([place]) 03/29/2018   Leg pain, anterior, left 02/27/2018   Groin pain, chronic, left 02/27/2018   Seborrheic keratoses 02/27/2018   Seasonal allergic rhinitis due to pollen 10/11/2017   Acute bronchitis with COPD ([place]) 10/11/2017   Essential hypertension 10/11/2017   Pulmonary emphysema ([place]) 10/11/2017   Cough 10/11/2017   Dysfunction of right  eustachian tube 10/11/2017   Ischemic cardiomyopathy 10/04/2017   Screen for colon cancer 09/11/2017   Leg cramps 09/11/2017   History of pneumonia 09/11/2017   History of abnormal mammogram 09/11/2017   Need for 23-polyvalent pneumococcal polysaccharide vaccine 09/11/2017   Estrogen deficiency 09/11/2017   Tobacco use 10/04/2016   Coronary artery disease involving native heart 04/28/2015   Hx of microdiscectomy 07/25/2014   Hyperlipidemia 01/16/2013   Chest pain of uncertain etiology 93/26/7124   Family history of heart disease 01/10/2013   Past Surgical History:  Procedure Laterality Date   ABDOMINAL HYSTERECTOMY     at age 62   BREAST BIOPSY Right    roughly 30 years ago with [facility]    cataract surgery Bilateral    CHOLECYSTECTOMY     colonscopy     ERCP     ESOPHAGOGASTRODUODENOSCOPY     LUMBAR LAMINECTOMY Right 07/25/2014   Procedure: Right L4 Hemilaminectomy, Right L4-5 Laminotomy , Decompression, Microdisectomy;  Surgeon: Marybelle Killings, MD;  Location: [facility];  Service: Orthopedics;  Laterality: Right;   RIGHT/LEFT HEART CATH AND CORONARY ANGIOGRAPHY N/A 12/23/2021   Procedure: RIGHT/LEFT HEART CATH AND CORONARY ANGIOGRAPHY;  Surgeon: Martinique, Peter M, MD;  Location: [facility];  Service: Cardiovascular;  Laterality: N/A;   SHOULDER SURGERY Right    TONSILLECTOMY     as a child   Family History  Problem Relation Age of Onset   Heart disease Mother    Heart failure Mother    Heart disease Father    Heart failure Father  CAD Other        both parents and multiple siblings with either died or had-related issues   Breast cancer Sister    Outpatient Medications Prior to Visit  Medication Sig Dispense Refill   albuterol (VENTOLIN HFA) 108 (90 Base) MCG/ACT inhaler Inhale 1-2 puffs into the lungs every 6 (six) hours as needed for wheezing or shortness of breath. And or cough. 1 each 2   atorvastatin (LIPITOR) 80 MG tablet Take 1 tablet (80 mg total) by mouth daily.  (Patient taking differently: Take 80 mg by mouth at bedtime.) 90 tablet 3   carvedilol (COREG) 6.25 MG tablet Take 1 tablet (6.25 mg total) by mouth 2 (two) times daily with a meal. TAKE 1 TABLET BY MOUTH TWICE DAILY 60 tablet 6   clopidogrel (PLAVIX) 75 MG tablet Take 1 tablet (75 mg total) by mouth daily. 90 tablet 3   [facility] ASPIRIN 81 MG EC tablet Take 81 mg by mouth in the morning.  0   fluticasone-salmeterol (WIXELA INHUB) 250-50 MCG/ACT AEPB Inhale 1 puff into the lungs in the morning and at bedtime. 60 each 5   furosemide (LASIX) 40 MG tablet Take 1.5 tablets (60 mg total) by mouth daily. 45 tablet 3   lisinopril (ZESTRIL) 40 MG tablet Take 1 tablet (40 mg total) by mouth daily. 30 tablet 6   empagliflozin (JARDIANCE) 10 MG TABS tablet Take 1 tablet (10 mg total) by mouth daily before breakfast. (Patient not taking: Reported on 05/18/2022) 30 tablet 3   nitroGLYCERIN (NITROSTAT) 0.4 MG SL tablet Place 1 tablet (0.4 mg total) under the tongue every 5 (five) minutes as needed for chest pain. (Patient not taking: Reported on 06/27/2022) 25 tablet 0   pantoprazole (PROTONIX) 40 MG tablet Take 1 tablet (40 mg total) by mouth daily. (Patient not taking: Reported on 06/27/2022) 90 tablet 0   potassium chloride (KLOR-CON) 10 MEQ tablet Take 1 tablet (10 mEq total) by mouth daily. (Patient not taking: Reported on 06/27/2022) 90 tablet 3   No facility-administered medications prior to visit.   Allergies  Allergen Reactions   Erythromycin Nausea Only     Objective:   Today's Vitals   06/27/22 1551  BP: 134/68  Pulse: 74  Temp: (!) 97.3 F (36.3 C)  TempSrc: Tympanic  SpO2: 98%  Weight: 174 lb (78.9 kg)  Height: '5\' 3"'$  (1.6 m)   Body mass index is 30.82 kg/m.   General: Well developed, well nourished. No acute distress. HEENT: Normocephalic, non-traumatic. Conjunctiva clear. External ears normal. EAC and TMs normal   bilaterally. Nose with mild congestion and rhinorrhea. Pain on percussion  over maxillary sinuses.  Mucous   membranes moist. Oropharynx clear. Good dentition. Neck: Supple. No lymphadenopathy. No thyromegaly. Lungs: Initial mucousy breath sounds that cleared with repeated deep breathing. No wheezing, rales or rhonchi. Psych: Alert and oriented. Normal mood and affect.  Health Maintenance Due  Topic Date Due   Hepatitis C Screening  Never done   DTaP/Tdap/Td (1 - Tdap) Never done     Assessment & Plan:   1. Acute non-recurrent maxillary sinusitis On exam, she likely has maxillary sinusitis. I recommend we treat her with a course of antibiotics for this.  - amoxicillin-clavulanate (AUGMENTIN) 875-125 MG tablet; Take 1 tablet by mouth 2 (two) times daily.  Dispense: 20 tablet; Refill: 0  2. Bronchitis Reviewed typical course of bronchitis. Discussed home care for viral illness, including rest, pushing fluids, and OTC medications as needed for symptom relief.  Recommend hot tea with honey for sore throat symptoms. Follow-up if needed for worsening or persistent symptoms.  - benzonatate (TESSALON) 200 MG capsule; Take 1 capsule (200 mg total) by mouth 2 (two) times daily as needed for cough.  Dispense: 20 capsule; Refill: 0  Return if symptoms worsen or fail to improve.   Haydee Salter, MD

## 2022-08-15 ENCOUNTER — Ambulatory Visit (HOSPITAL_BASED_OUTPATIENT_CLINIC_OR_DEPARTMENT_OTHER): Payer: Medicare Other | Admitting: Pulmonary Disease

## 2022-08-19 ENCOUNTER — Ambulatory Visit: Payer: Medicare Other | Attending: Internal Medicine | Admitting: Internal Medicine

## 2022-08-19 ENCOUNTER — Encounter: Payer: Self-pay | Admitting: Internal Medicine

## 2022-08-19 VITALS — BP 162/70 | HR 63 | Ht 63.0 in | Wt 178.0 lb

## 2022-08-19 DIAGNOSIS — I2510 Atherosclerotic heart disease of native coronary artery without angina pectoris: Secondary | ICD-10-CM | POA: Insufficient documentation

## 2022-08-19 MED ORDER — CARVEDILOL 6.25 MG PO TABS: 6.2500 mg | ORAL_TABLET | Freq: Two times a day (BID) | ORAL | 3 refills | Status: DC

## 2022-08-19 MED ORDER — FUROSEMIDE 40 MG PO TABS: 60.0000 mg | ORAL_TABLET | Freq: Every day | ORAL | 3 refills | Status: DC

## 2022-08-19 MED ORDER — POTASSIUM CHLORIDE ER 20 MEQ PO TBCR: 20.0000 meq | EXTENDED_RELEASE_TABLET | Freq: Every day | ORAL | 3 refills | Status: DC

## 2022-08-19 MED ORDER — ATORVASTATIN CALCIUM 80 MG PO TABS: 80.0000 mg | ORAL_TABLET | Freq: Every day | ORAL | 3 refills | Status: DC

## 2022-08-19 NOTE — Patient Instructions (Addendum)
Medication Instructions:   STOP  Plavix  STOP Jardiance  INCREASE Potassium to 20 Meq daily   *If you need a refill on your cardiac medications before your next appointment, please call your pharmacy*  Lab Work: NONE ordered at this time of appointment   If you have labs (blood work) drawn today and your tests are completely normal, you will receive your results only by: [place] (if you have MyChart) OR A paper copy in the mail If you have any lab test that is abnormal or we need to change your treatment, we will call you to review the results.  Testing/Procedures: NONE ordered at this time of appointment   Follow-Up: At [facility], you and your health needs are our priority.  As part of our continuing mission to provide you with exceptional heart care, we have created designated Provider Care Teams.  These Care Teams include your primary Cardiologist (physician) and Advanced Practice Providers (APPs -  Physician Assistants and Nurse Practitioners) who all work together to provide you with the care you need, when you need it.  We recommend signing up for the patient portal called "MyChart".  Sign up information is provided on this After Visit Summary.  MyChart is used to connect with patients for Virtual Visits (Telemedicine).  Patients are able to view lab/test results, encounter notes, upcoming appointments, etc.  Non-urgent messages can be sent to your provider as well.   To learn more about what you can do with MyChart, go to [email or web address].    Your next appointment:   1 year(s)  Provider:   Janina Mayo, MD     Other Instructions

## 2022-08-19 NOTE — Progress Notes (Signed)
Cardiology Office Note:    Date:  08/19/2022   ID:  Terri [name], DOB 1945/10/01, MRN [id number]  PCP:  Libby Maw, MD   [facility] Cardiologist:  Quay Burow, MD     Referring MD: Libby Maw   No chief complaint on file. Follow up  History of Present Illness:    Terri [name] is a 77 y.o. female with a hx of smoking, CAD s/p anterior stemi 01/2015 DESx2 prox LAD, recent DES to prox and mid  RCA  She brings in DC summary from [facility]. She presented with palpitations and SOB to the hospital there. She also reported indigestion.. She underwent LHC, PCI of an occluded mid and prox RCA (written on  the back of her DC summary). Noted to be 99% on  a voicemail from the internationalist. Echo showed EF 40%, mild MR, no aortic stenosis or AI, no pericardial effusion. Her husband noted that her blood pressure dropped suddenly.  She had CPR. No shockable rhythm. This was March 24 , 2023.  She feels much better. She's on DAPT. Her blood pressure is well controlled. Still smoking. No PND. No orthopnea. No Le edema.   Interval Hx 12/21/2021: She went to the ED for  mid-sternal chest pain 6/24. She was given nitro. She said her arm hurt and she was diaphoretic. Her chest pain resolved. EKG did not show any ischemic changes. Her troponin was very minimal.  Today, she notes no energy. She sleeps a lot. She is fatigued with minimal activity.  She notes both sides of her chest aches. She denies orthopnea, PND. No LE edema. She has significant shortness of breath with minimal activity.  She feels congested. She is coughing up clear phlegm which is chronic. She states these symptoms are the same ones she had prior to her PCI in March and have persisted.   Interval Hx 01/21/2022 Terri [name] underwent LHC. She did not have re-stenosis. Her wedge was normal. She feels better and more energetic. She is down to 4 cigarettes  per day.  Interim hx 08/19/2022 No chest  pain. No progressive DOE. She is feeling a lot better. Her husband has been having PNA and not doing well.   Cardiology Studies LHC 12/23/2021: non obstructive CAD, patent stents in the prox to mid LAD, ostial RCA to the PL Koren Plyler of the RCA. Wedge was normal. Normal CI  Myoview-08/10/2021-large defect with moderate intensity in the apical to basal anterior, anteroseptal and apex that's fixed  06/11/2021-TTE- EF 40-45%, mildly reduced. 04/28/2021- EF 50-55, RV is normal, mild-mod MR, AI mild to moderate  Past Medical History:  Diagnosis Date   Aortic insufficiency    a. Mild AI by echo 2014.   Arthritis    CAD (coronary artery disease)    a. Anterior STEMI 01/2015 s/p overlapping DES x2 to prox LAD, DES to prox RCA in [facility].   CHF (congestive heart failure) (HCC)    Chronic back pain    stenosis/HNP   History of bronchitis 06/06/2012   History of colon polyps    Hyperlipidemia    Ischemic cardiomyopathy    Joint pain    Joint swelling    Myocardial infarction [facility])    Pneumonia 06/06/2012   Smokers' cough (H[facility])    Stress headaches    Tobacco abuse     Past Surgical History:  Procedure Laterality Date   ABDOMINAL HYSTERECTOMY     at age 35  BREAST BIOPSY Right    roughly 30 years ago with [facility]    cataract surgery Bilateral    CHOLECYSTECTOMY     colonscopy     ERCP     ESOPHAGOGASTRODUODENOSCOPY     LUMBAR LAMINECTOMY Right 07/25/2014   Procedure: Right L4 Hemilaminectomy, Right L4-5 Laminotomy , Decompression, Microdisectomy;  Surgeon: Marybelle Killings, MD;  Location: [facility];  Service: Orthopedics;  Laterality: Right;   RIGHT/LEFT HEART CATH AND CORONARY ANGIOGRAPHY N/A 12/23/2021   Procedure: RIGHT/LEFT HEART CATH AND CORONARY ANGIOGRAPHY;  Surgeon: Martinique, Peter M, MD;  Location: [facility];  Service: Cardiovascular;  Laterality: N/A;   SHOULDER SURGERY Right    TONSILLECTOMY     as a child    Current Medications: Current Outpatient  Medications on File Prior to Visit  Medication Sig Dispense Refill   albuterol (VENTOLIN HFA) 108 (90 Base) MCG/ACT inhaler Inhale 1-2 puffs into the lungs every 6 (six) hours as needed for wheezing or shortness of breath. And or cough. 1 each 2   amoxicillin-clavulanate (AUGMENTIN) 875-125 MG tablet Take 1 tablet by mouth 2 (two) times daily. 20 tablet 0   atorvastatin (LIPITOR) 80 MG tablet Take 1 tablet (80 mg total) by mouth daily. (Patient taking differently: Take 80 mg by mouth at bedtime.) 90 tablet 3   benzonatate (TESSALON) 200 MG capsule Take 1 capsule (200 mg total) by mouth 2 (two) times daily as needed for cough. 20 capsule 0   carvedilol (COREG) 6.25 MG tablet Take 1 tablet (6.25 mg total) by mouth 2 (two) times daily with a meal. TAKE 1 TABLET BY MOUTH TWICE DAILY 60 tablet 6   clopidogrel (PLAVIX) 75 MG tablet Take 1 tablet (75 mg total) by mouth daily. 90 tablet 3   [facility] ASPIRIN 81 MG EC tablet Take 81 mg by mouth in the morning.  0   empagliflozin (JARDIANCE) 10 MG TABS tablet Take 1 tablet (10 mg total) by mouth daily before breakfast. (Patient not taking: Reported on 05/18/2022) 30 tablet 3   fluticasone-salmeterol (WIXELA INHUB) 250-50 MCG/ACT AEPB Inhale 1 puff into the lungs in the morning and at bedtime. 60 each 5   furosemide (LASIX) 40 MG tablet Take 1.5 tablets (60 mg total) by mouth daily. 45 tablet 3   lisinopril (ZESTRIL) 40 MG tablet Take 1 tablet (40 mg total) by mouth daily. 30 tablet 6   nitroGLYCERIN (NITROSTAT) 0.4 MG SL tablet Place 1 tablet (0.4 mg total) under the tongue every 5 (five) minutes as needed for chest pain. (Patient not taking: Reported on 06/27/2022) 25 tablet 0   pantoprazole (PROTONIX) 40 MG tablet Take 1 tablet (40 mg total) by mouth daily. (Patient not taking: Reported on 06/27/2022) 90 tablet 0   potassium chloride (KLOR-CON) 10 MEQ tablet Take 1 tablet (10 mEq total) by mouth daily. (Patient not taking: Reported on 06/27/2022) 90 tablet 3   No  current facility-administered medications on file prior to visit.     Allergies:   Erythromycin   Social History   Socioeconomic History   Marital status: Married    Spouse name: Annaly Darbonne   Number of children: Not on file   Years of education: Not on file   Highest education level: Not on file  Occupational History   Occupation: retired  Tobacco Use   Smoking status: Every Day    Packs/day: 0.50    Years: 60.00    Additional pack years: 0.00    Total pack years:  30.00    Types: Cigarettes   Smokeless tobacco: Never  Vaping Use   Vaping Use: Never used  Substance and Sexual Activity   Alcohol use: No   Drug use: No   Sexual activity: Yes    Birth control/protection: Surgical  Other Topics Concern   Not on file  Social History Narrative   Not on file   Social Determinants of Health   Financial Resource Strain: Low Risk  (10/13/2021)   Overall Financial Resource Strain (CARDIA)    Difficulty of Paying Living Expenses: Not hard at all  Food Insecurity: No Food Insecurity (10/13/2021)   Hunger Vital Sign    Worried About Running Out of Food in the Last Year: Never true    Ran Out of Food in the Last Year: Never true  Transportation Needs: No Transportation Needs (10/13/2021)   PRAPARE - Hydrologist (Medical): No    Lack of Transportation (Non-Medical): No  Physical Activity: Inactive (10/13/2021)   Exercise Vital Sign    Days of Exercise per Week: 0 days    Minutes of Exercise per Session: 0 min  Stress: No Stress Concern Present (10/13/2021)   [place]    Feeling of Stress : Not at all  Social Connections: Moderately Integrated (10/13/2021)   Social Connection and Isolation Panel [NHANES]    Frequency of Communication with Friends and Family: Three times a week    Frequency of Social Gatherings with Friends and Family: Three times a week    Attends Religious  Services: More than 4 times per year    Active Member of Clubs or Organizations: No    Attends Archivist Meetings: Never    Marital Status: Married     Family History: The patient's family history includes Breast cancer in her sister; CAD in an other family member; Heart disease in her father and mother; Heart failure in her father and mother.  ROS:   Please see the history of present illness.     All other systems reviewed and are negative.  EKGs/Labs/Other Studies Reviewed:    The following studies were reviewed today:   EKG:  EKG is  ordered today.  The ekg ordered today demonstrates   EKG 09/17/2021 - NSR, minimal LVH  EKG: 01/21/2022- NSR, LAD, LVH, septal Q  EKG: 08/19/2022- NSR, LAD, LVH with repol  Recent Labs: 11/27/2021: Platelets 236 12/20/2021: BUN 14; Creatinine, Ser 0.89; Magnesium 2.0 12/23/2021: Hemoglobin 9.9; Potassium 3.3; Sodium 130   Recent Lipid Panel    Component Value Date/Time   CHOL 112 07/29/2021 1507   CHOL 133 08/14/2020 1010   TRIG 148.0 07/29/2021 1507   HDL 39.10 07/29/2021 1507   HDL 41 08/14/2020 1010   CHOLHDL 3 07/29/2021 1507   VLDL 29.6 07/29/2021 1507   LDLCALC 44 07/29/2021 1507   LDLCALC 63 08/14/2020 1010     Risk Assessment/Calculations:           Physical Exam:    VS:   Vitals:   08/19/22 0857  BP: (!) 162/70  Pulse: 63  SpO2: 98%    Wt Readings from Last 3 Encounters:  06/27/22 174 lb (78.9 kg)  05/18/22 173 lb 9.6 oz (78.7 kg)  01/31/22 171 lb 12.8 oz (77.9 kg)     GEN:  Well nourished, well developed in no acute distress HEENT: Normal NECK: No JVD;  LYMPHATICS: No lymphadenopathy CARDIAC: RRR, no murmurs, rubs, gallops  RESPIRATORY:  scant + expiratory wheezes ABDOMEN: Soft, non-tender, non-distended MUSCULOSKELETAL:  No edema; No deformity  Vasc: Right radial 2+ SKIN: Warm and dry NEUROLOGIC:  Alert and oriented x 3 PSYCHIATRIC:  Normal affect   ASSESSMENT:    Ischemic cardiomyopathy  NYHA Class III: [id number].  s/p anterior stemi 01/2015 DESx2 prox LAD, recent DES to prox and mid  RCA 99% s/p PCI 08/27/2021.  Stents are patent. Her wedge was normal with LHC  Jardiance 10 mg daily was stopped 2/2 cost. Continue asa. Can stop plavix today. Can continue lasix 60 mg daily. K 20 meq.   HTN: well controlled at home 130s. continue lisinopril 40 mg daily and coreg 6.25 mg BID. Continue to monitor  HLD:  Continue atorvastatin 80 mg daily . LDL 44 07/29/2021  Smoking Cessation: she is cutting back. Continue to encourage  PLAN:    In order of problems listed above:  Follow up 12 months        Medication Adjustments/Labs and Tests Ordered: Current medicines are reviewed at length with the patient today.  Concerns regarding medicines are outlined above.  No orders of the defined types were placed in this encounter.  No orders of the defined types were placed in this encounter.   There are no Patient Instructions on file for this visit.   Signed, Janina Mayo, MD  08/19/2022 8:32 AM    [facility]

## 2022-09-14 ENCOUNTER — Other Ambulatory Visit: Payer: Self-pay

## 2022-09-14 MED ORDER — LISINOPRIL 40 MG PO TABS
40.0000 mg | ORAL_TABLET | Freq: Every day | ORAL | 6 refills | Status: DC
Start: 2022-09-14 — End: 2023-05-12

## 2022-10-11 ENCOUNTER — Telehealth: Payer: Self-pay | Admitting: Family Medicine

## 2022-10-11 NOTE — Telephone Encounter (Signed)
Contacted [name] to schedule their annual wellness visit. Appointment made for 10/21/22.  Rudell Cobb AWV direct phone # [phone]

## 2022-10-20 DIAGNOSIS — H02052 Trichiasis without entropian right lower eyelid: Secondary | ICD-10-CM | POA: Diagnosis not present

## 2022-10-20 DIAGNOSIS — H02834 Dermatochalasis of left upper eyelid: Secondary | ICD-10-CM | POA: Diagnosis not present

## 2022-10-20 DIAGNOSIS — H527 Unspecified disorder of refraction: Secondary | ICD-10-CM | POA: Diagnosis not present

## 2022-10-20 DIAGNOSIS — H02831 Dermatochalasis of right upper eyelid: Secondary | ICD-10-CM | POA: Diagnosis not present

## 2022-10-20 DIAGNOSIS — H0469 Other changes of lacrimal passages: Secondary | ICD-10-CM | POA: Diagnosis not present

## 2022-10-20 DIAGNOSIS — H04123 Dry eye syndrome of bilateral lacrimal glands: Secondary | ICD-10-CM | POA: Diagnosis not present

## 2022-10-20 DIAGNOSIS — H26493 Other secondary cataract, bilateral: Secondary | ICD-10-CM | POA: Diagnosis not present

## 2022-10-20 DIAGNOSIS — H35033 Hypertensive retinopathy, bilateral: Secondary | ICD-10-CM | POA: Diagnosis not present

## 2022-10-21 ENCOUNTER — Ambulatory Visit (INDEPENDENT_AMBULATORY_CARE_PROVIDER_SITE_OTHER): Payer: Medicare Other

## 2022-10-21 VITALS — Ht 62.5 in | Wt 170.0 lb

## 2022-10-21 DIAGNOSIS — Z0000 Encounter for general adult medical examination without abnormal findings: Secondary | ICD-10-CM | POA: Diagnosis not present

## 2022-10-21 NOTE — Patient Instructions (Signed)
Terri [name] , Thank you for taking time to come for your Medicare Wellness Visit. I appreciate your ongoing commitment to your health goals. Please review the following plan we discussed and let me know if I can assist you in the future.   These are the goals we discussed:  Goals      Increase physical activity     Patient Stated     10/21/2022, quit smoking        This is a list of the screening recommended for you and due dates:  Health Maintenance  Topic Date Due   Hepatitis C Screening: USPSTF Recommendation to screen - Ages 35-79 yo.  Never done   DTaP/Tdap/Td vaccine (1 - Tdap) Never done   Zoster (Shingles) Vaccine (1 of 2) Never done   Screening for Lung Cancer  11/28/2022   Flu Shot  01/05/2023   Medicare Annual Wellness Visit  10/21/2023   Pneumonia Vaccine  Completed   DEXA scan (bone density measurement)  Completed   HPV Vaccine  Aged Out   COVID-19 Vaccine  Discontinued    Advanced directives: Please bring a copy of your POA (Power of [place]) and/or Living Will to your next appointment.   Conditions/risks identified: smoking  Next appointment: Follow up in one year for your annual wellness visit    Preventive Care 65 Years and Older, Female Preventive care refers to lifestyle choices and visits with your health care provider that can promote health and wellness. What does preventive care include? A yearly physical exam. This is also called an annual well check. Dental exams once or twice a year. Routine eye exams. Ask your health care provider how often you should have your eyes checked. Personal lifestyle choices, including: Daily care of your teeth and gums. Regular physical activity. Eating a healthy diet. Avoiding tobacco and drug use. Limiting alcohol use. Practicing safe sex. Taking low-dose aspirin every day. Taking vitamin and mineral supplements as recommended by your health care provider. What happens during an annual well check? The services  and screenings done by your health care provider during your annual well check will depend on your age, overall health, lifestyle risk factors, and family history of disease. Counseling  Your health care provider may ask you questions about your: Alcohol use. Tobacco use. Drug use. Emotional well-being. Home and relationship well-being. Sexual activity. Eating habits. History of falls. Memory and ability to understand (cognition). Work and work Astronomer. Reproductive health. Screening  You may have the following tests or measurements: Height, weight, and BMI. Blood pressure. Lipid and cholesterol levels. These may be checked every 5 years, or more frequently if you are over 50 years old. Skin check. Lung cancer screening. You may have this screening every year starting at age 76 if you have a 30-pack-year history of smoking and currently smoke or have quit within the past 15 years. Fecal occult blood test (FOBT) of the stool. You may have this test every year starting at age 72. Flexible sigmoidoscopy or colonoscopy. You may have a sigmoidoscopy every 5 years or a colonoscopy every 10 years starting at age 59. Hepatitis C blood test. Hepatitis B blood test. Sexually transmitted disease (STD) testing. Diabetes screening. This is done by checking your blood sugar (glucose) after you have not eaten for a while (fasting). You may have this done every 1-3 years. Bone density scan. This is done to screen for osteoporosis. You may have this done starting at age 62. Mammogram. This may be done  every 1-2 years. Talk to your health care provider about how often you should have regular mammograms. Talk with your health care provider about your test results, treatment options, and if necessary, the need for more tests. Vaccines  Your health care provider may recommend certain vaccines, such as: Influenza vaccine. This is recommended every year. Tetanus, diphtheria, and acellular pertussis  (Tdap, Td) vaccine. You may need a Td booster every 10 years. Zoster vaccine. You may need this after age 44. Pneumococcal 13-valent conjugate (PCV13) vaccine. One dose is recommended after age 68. Pneumococcal polysaccharide (PPSV23) vaccine. One dose is recommended after age 34. Talk to your health care provider about which screenings and vaccines you need and how often you need them. This information is not intended to replace advice given to you by your health care provider. Make sure you discuss any questions you have with your health care provider. Document Released: 06/19/2015 Document Revised: 02/10/2016 Document Reviewed: 03/24/2015 Elsevier Interactive Patient Education  2017 [employer].  Fall Prevention in the Home Falls can cause injuries. They can happen to people of all ages. There are many things you can do to make your home safe and to help prevent falls. What can I do on the outside of my home? Regularly fix the edges of walkways and driveways and fix any cracks. Remove anything that might make you trip as you walk through a door, such as a raised step or threshold. Trim any bushes or trees on the path to your home. Use bright outdoor lighting. Clear any walking paths of anything that might make someone trip, such as rocks or tools. Regularly check to see if handrails are loose or broken. Make sure that both sides of any steps have handrails. Any raised decks and porches should have guardrails on the edges. Have any leaves, snow, or ice cleared regularly. Use sand or salt on walking paths during winter. Clean up any spills in your garage right away. This includes oil or grease spills. What can I do in the bathroom? Use night lights. Install grab bars by the toilet and in the tub and shower. Do not use towel bars as grab bars. Use non-skid mats or decals in the tub or shower. If you need to sit down in the shower, use a plastic, non-slip stool. Keep the floor dry. Clean  up any water that spills on the floor as soon as it happens. Remove soap buildup in the tub or shower regularly. Attach bath mats securely with double-sided non-slip rug tape. Do not have throw rugs and other things on the floor that can make you trip. What can I do in the bedroom? Use night lights. Make sure that you have a light by your bed that is easy to reach. Do not use any sheets or blankets that are too big for your bed. They should not hang down onto the floor. Have a firm chair that has side arms. You can use this for support while you get dressed. Do not have throw rugs and other things on the floor that can make you trip. What can I do in the kitchen? Clean up any spills right away. Avoid walking on wet floors. Keep items that you use a lot in easy-to-reach places. If you need to reach something above you, use a strong step stool that has a grab bar. Keep electrical cords out of the way. Do not use floor polish or wax that makes floors slippery. If you must use wax, use  non-skid floor wax. Do not have throw rugs and other things on the floor that can make you trip. What can I do with my stairs? Do not leave any items on the stairs. Make sure that there are handrails on both sides of the stairs and use them. Fix handrails that are broken or loose. Make sure that handrails are as long as the stairways. Check any carpeting to make sure that it is firmly attached to the stairs. Fix any carpet that is loose or worn. Avoid having throw rugs at the top or bottom of the stairs. If you do have throw rugs, attach them to the floor with carpet tape. Make sure that you have a light switch at the top of the stairs and the bottom of the stairs. If you do not have them, ask someone to add them for you. What else can I do to help prevent falls? Wear shoes that: Do not have high heels. Have rubber bottoms. Are comfortable and fit you well. Are closed at the toe. Do not wear sandals. If you  use a stepladder: Make sure that it is fully opened. Do not climb a closed stepladder. Make sure that both sides of the stepladder are locked into place. Ask someone to hold it for you, if possible. Clearly mark and make sure that you can see: Any grab bars or handrails. First and last steps. Where the edge of each step is. Use tools that help you move around (mobility aids) if they are needed. These include: Canes. Walkers. Scooters. Crutches. Turn on the lights when you go into a dark area. Replace any light bulbs as soon as they burn out. Set up your furniture so you have a clear path. Avoid moving your furniture around. If any of your floors are uneven, fix them. If there are any pets around you, be aware of where they are. Review your medicines with your doctor. Some medicines can make you feel dizzy. This can increase your chance of falling. Ask your doctor what other things that you can do to help prevent falls. This information is not intended to replace advice given to you by your health care provider. Make sure you discuss any questions you have with your health care provider. Document Released: 03/19/2009 Document Revised: 10/29/2015 Document Reviewed: 06/27/2014 Elsevier Interactive Patient Education  2017 [employer].

## 2022-10-21 NOTE — Progress Notes (Signed)
I connected with  Terri [name] on 10/21/22 by a audio enabled telemedicine application and verified that I am speaking with the correct person using two identifiers.  Patient Location: Home  Provider Location: Office/Clinic  I discussed the limitations of evaluation and management by telemedicine. The patient expressed understanding and agreed to proceed.  Subjective:   Terri [name] is a 77 y.o. female who presents for Medicare Annual (Subsequent) preventive examination.  Review of Systems     Cardiac Risk Factors include: advanced age (>62men, >61 women);dyslipidemia;hypertension;obesity (BMI >30kg/m2);smoking/ tobacco exposure     Objective:    Today's Vitals   10/21/22 1012  Weight: 170 lb (77.1 kg)  Height: 5' 2.5" (1.588 m)   Body mass index is 30.6 kg/m.     10/21/2022   10:16 AM 12/23/2021   12:08 PM 11/27/2021   12:20 AM 10/13/2021    8:25 AM 06/17/2021    1:11 PM 06/11/2021    6:50 AM 06/10/2021    6:10 AM  Advanced Directives  Does Patient Have a Medical Advance Directive? Yes No No No No No No  Type of Estate agent of [place];Living will        Copy of Healthcare Power of Attorney in Chart? No - copy requested        Would patient like information on creating a medical advance directive?    No - Patient declined  No - Patient declined No - Patient declined    Current Medications (verified) Outpatient Encounter Medications as of 10/21/2022  Medication Sig   acetaminophen (TYLENOL) 325 MG tablet Take 325 mg by mouth as needed.   albuterol (VENTOLIN HFA) 108 (90 Base) MCG/ACT inhaler Inhale 1-2 puffs into the lungs every 6 (six) hours as needed for wheezing or shortness of breath. And or cough.   atorvastatin (LIPITOR) 80 MG tablet Take 1 tablet (80 mg total) by mouth at bedtime.   benzonatate (TESSALON) 200 MG capsule Take 1 capsule (200 mg total) by mouth 2 (two) times daily as needed for cough.   carvedilol (COREG) 6.25 MG tablet Take 1  tablet (6.25 mg total) by mouth 2 (two) times daily with a meal.   [facility] ASPIRIN 81 MG EC tablet Take 81 mg by mouth in the morning.   fluticasone-salmeterol (WIXELA INHUB) 250-50 MCG/ACT AEPB Inhale 1 puff into the lungs in the morning and at bedtime.   furosemide (LASIX) 40 MG tablet Take 1.5 tablets (60 mg total) by mouth daily.   lisinopril (ZESTRIL) 40 MG tablet Take 1 tablet (40 mg total) by mouth daily.   potassium chloride 20 MEQ TBCR Take 1 tablet (20 mEq total) by mouth daily.   valACYclovir (VALTREX) 1000 MG tablet Take 500 mg by mouth 2 (two) times daily as needed.   amoxicillin-clavulanate (AUGMENTIN) 875-125 MG tablet Take 1 tablet by mouth 2 (two) times daily. (Patient not taking: Reported on 10/21/2022)   No facility-administered encounter medications on file as of 10/21/2022.    Allergies (verified) Erythromycin   History: Past Medical History:  Diagnosis Date   Aortic insufficiency    a. Mild AI by echo 2014.   Arthritis    CAD (coronary artery disease)    a. Anterior STEMI 01/2015 s/p overlapping DES x2 to prox LAD, DES to prox RCA in [facility].   CHF (congestive heart failure) (HCC)    Chronic back pain    stenosis/HNP   History of bronchitis 06/06/2012   History of colon  polyps    Hyperlipidemia    Ischemic cardiomyopathy    Joint pain    Joint swelling    Myocardial infarction [facility])    Pneumonia 06/06/2012   Smokers' cough (HCC)    Stress headaches    Tobacco abuse    Past Surgical History:  Procedure Laterality Date   ABDOMINAL HYSTERECTOMY     at age 41   BREAST BIOPSY Right    roughly 30 years ago with [facility]    cataract surgery Bilateral    CHOLECYSTECTOMY     colonscopy     ERCP     ESOPHAGOGASTRODUODENOSCOPY     LUMBAR LAMINECTOMY Right 07/25/2014   Procedure: Right L4 Hemilaminectomy, Right L4-5 Laminotomy , Decompression, Microdisectomy;  Surgeon: Eldred Manges, MD;  Location: [facility] OR;  Service: Orthopedics;  Laterality: Right;    RIGHT/LEFT HEART CATH AND CORONARY ANGIOGRAPHY N/A 12/23/2021   Procedure: RIGHT/LEFT HEART CATH AND CORONARY ANGIOGRAPHY;  Surgeon: [place], Peter M, MD;  Location: [facility] INVASIVE CV LAB;  Service: Cardiovascular;  Laterality: N/A;   SHOULDER SURGERY Right    TONSILLECTOMY     as a child   Family History  Problem Relation Age of Onset   Heart disease Mother    Heart failure Mother    Heart disease Father    Heart failure Father    CAD Other        both parents and multiple siblings with either died or had-related issues   Breast cancer Sister    Social History   Socioeconomic History   Marital status: Married    Spouse name: Terri [name]   Number of children: Not on file   Years of education: Not on file   Highest education level: Not on file  Occupational History   Occupation: retired  Tobacco Use   Smoking status: Every Day    Packs/day: 0.50    Years: 60.00    Additional pack years: 0.00    Total pack years: 30.00    Types: Cigarettes   Smokeless tobacco: Never   Tobacco comments:    08/19/2022 Patient smoke 7-10 cigarettes daily  Vaping Use   Vaping Use: Never used  Substance and Sexual Activity   Alcohol use: No   Drug use: No   Sexual activity: Yes    Birth control/protection: Surgical  Other Topics Concern   Not on file  Social History Narrative   Not on file   Social Determinants of Health   Financial Resource Strain: Low Risk  (10/21/2022)   Overall Financial Resource Strain (CARDIA)    Difficulty of Paying Living Expenses: Not hard at all  Food Insecurity: No Food Insecurity (10/21/2022)   Hunger Vital Sign    Worried About Running Out of Food in the Last Year: Never true    Ran Out of Food in the Last Year: Never true  Transportation Needs: No Transportation Needs (10/21/2022)   PRAPARE - Administrator, Civil Service (Medical): No    Lack of Transportation (Non-Medical): No  Physical Activity: Inactive (10/21/2022)   Exercise Vital Sign     Days of Exercise per Week: 0 days    Minutes of Exercise per Session: 0 min  Stress: No Stress Concern Present (10/21/2022)   [employer] of Occupational Health - Occupational Stress Questionnaire    Feeling of Stress : Not at all  Social Connections: Moderately Integrated (10/13/2021)   Social Connection and Isolation Panel [NHANES]    Frequency of Communication with  Friends and Family: Three times a week    Frequency of Social Gatherings with Friends and Family: Three times a week    Attends Religious Services: More than 4 times per year    Active Member of Clubs or Organizations: No    Attends Banker Meetings: Never    Marital Status: Married    Tobacco Counseling Ready to quit: Yes Counseling given: Not Answered Tobacco comments: 08/19/2022 Patient smoke 7-10 cigarettes daily   Clinical Intake:  Pre-visit preparation completed: Yes  Pain : No/denies pain     Nutritional Status: BMI > 30  Obese Nutritional Risks: None Diabetes: No  How often do you need to have someone help you when you read instructions, pamphlets, or other written materials from your doctor or pharmacy?: 1 - Never  Diabetic? no  Interpreter Needed?: No  Information entered by :: NAllen LPN   Activities of Daily Living    10/21/2022   10:17 AM  In your present state of health, do you have any difficulty performing the following activities:  Hearing? 1  Comment has hearing aids  Vision? 0  Difficulty concentrating or making decisions? 0  Walking or climbing stairs? 0  Dressing or bathing? 0  Doing errands, shopping? 0  Preparing Food and eating ? N  Using the Toilet? N  In the past six months, have you accidently leaked urine? Y  Do you have problems with loss of bowel control? N  Managing your Medications? N  Managing your Finances? N  Housekeeping or managing your Housekeeping? N    Patient Care Team: Mliss Sax, MD as PCP - General (Family  Medicine) Wyline Mood Alben Spittle, MD as PCP - Cardiology (Cardiology)  Indicate any recent Medical Services you may have received from other than [facility] providers in the past year (date may be approximate).     Assessment:   This is a routine wellness examination for [place].  Hearing/Vision screen Vision Screening - Comments:: Regular eye exams, [facility]  Dietary issues and exercise activities discussed: Current Exercise Habits: The patient does not participate in regular exercise at present   Goals Addressed             This Visit's Progress    Patient Stated       10/21/2022, quit smoking       Depression Screen    10/21/2022   10:17 AM 06/27/2022    3:59 PM 11/02/2021    3:14 PM 10/13/2021    8:24 AM 08/31/2021   11:28 AM 07/29/2021    2:28 PM 09/02/2020   11:02 AM  PHQ 2/9 Scores  PHQ - 2 Score 0 0 0 0 0 0 0    Fall Risk    10/21/2022   10:17 AM 06/27/2022    3:59 PM 11/02/2021    3:14 PM 10/13/2021    8:26 AM 08/31/2021   11:28 AM  Fall Risk   Falls in the past year? 0 0 0 0 0  Number falls in past yr: 0 0 0 0 0  Injury with Fall? 0 0  0   Risk for fall due to : Medication side effect No Fall Risks     Follow up Falls prevention discussed;Education provided;Falls evaluation completed Falls evaluation completed  Falls evaluation completed     FALL RISK PREVENTION PERTAINING TO THE HOME:  Any stairs in or around the home? Yes  If so, are there any without handrails? No  Home free of  loose throw rugs in walkways, pet beds, electrical cords, etc? Yes  Adequate lighting in your home to reduce risk of falls? Yes   ASSISTIVE DEVICES UTILIZED TO PREVENT FALLS:  Life alert? No  Use of a cane, walker or w/c? No  Grab bars in the bathroom? No  Shower chair or bench in shower? Yes  Elevated toilet seat or a handicapped toilet? Yes   TIMED UP AND GO:  Was the test performed? No .      Cognitive Function:        10/21/2022   10:18 AM  6CIT Screen  What Year?  0 points  What month? 0 points  What time? 0 points  Count back from 20 0 points  Months in reverse 0 points  Repeat phrase 10 points  Total Score 10 points    Immunizations Immunization History  Administered Date(s) Administered   Fluad Quad(high Dose 65+) 01/31/2019   Influenza, High Dose Seasonal PF 03/16/2018   Influenza-Unspecified 04/06/2020, 03/15/2021, 03/06/2022   PFIZER(Purple Top)SARS-COV-2 Vaccination 07/05/2019, 07/26/2019, 03/21/2020   Pneumococcal Conjugate-13 07/14/2020   Pneumococcal Polysaccharide-23 09/11/2017    TDAP status: Due, Education has been provided regarding the importance of this vaccine. Advised may receive this vaccine at local pharmacy or Health Dept. Aware to provide a copy of the vaccination record if obtained from local pharmacy or Health Dept. Verbalized acceptance and understanding.  Flu Vaccine status: Up to date  Pneumococcal vaccine status: Up to date  Covid-19 vaccine status: Completed vaccines  Qualifies for Shingles Vaccine? Yes   Zostavax completed No   Shingrix Completed?: No.    Education has been provided regarding the importance of this vaccine. Patient has been advised to call insurance company to determine out of pocket expense if they have not yet received this vaccine. Advised may also receive vaccine at local pharmacy or Health Dept. Verbalized acceptance and understanding.  Screening Tests Health Maintenance  Topic Date Due   Hepatitis C Screening  Never done   DTaP/Tdap/Td (1 - Tdap) Never done   Zoster Vaccines- Shingrix (1 of 2) Never done   Medicare Annual Wellness (AWV)  10/14/2022   Lung Cancer Screening  11/28/2022   INFLUENZA VACCINE  01/05/2023   Pneumonia Vaccine 71+ Years old  Completed   DEXA SCAN  Completed   HPV VACCINES  Aged Out   COVID-19 Vaccine  Discontinued    Health Maintenance  Health Maintenance Due  Topic Date Due   Hepatitis C Screening  Never done   DTaP/Tdap/Td (1 - Tdap) Never done    Zoster Vaccines- Shingrix (1 of 2) Never done   Medicare Annual Wellness (AWV)  10/14/2022    Colorectal cancer screening: No longer required.   Mammogram status: No longer required due to age.  Bone Density status: Completed 10/03/2017.   Lung Cancer Screening: (Low Dose CT Chest recommended if Age 90-80 years, 30 pack-year currently smoking OR have quit w/in 15years.) does qualify.   Lung Cancer Screening Referral: CT scan 11/27/2021  Additional Screening:  Hepatitis C Screening: does qualify;   Vision Screening: Recommended annual ophthalmology exams for early detection of glaucoma and other disorders of the eye. Is the patient up to date with their annual eye exam?  Yes  Who is the provider or what is the name of the office in which the patient attends annual eye exams? [facility] If pt is not established with a provider, would they like to be referred to a provider to establish  care? No .   Dental Screening: Recommended annual dental exams for proper oral hygiene  Community Resource Referral / Chronic Care Management: CRR required this visit?  No   CCM required this visit?  No      Plan:     I have personally reviewed and noted the following in the patient's chart:   Medical and social history Use of alcohol, tobacco or illicit drugs  Current medications and supplements including opioid prescriptions. Patient is not currently taking opioid prescriptions. Functional ability and status Nutritional status Physical activity Advanced directives List of other physicians Hospitalizations, surgeries, and ER visits in previous 12 months Vitals Screenings to include cognitive, depression, and falls Referrals and appointments  In addition, I have reviewed and discussed with patient certain preventive protocols, quality metrics, and best practice recommendations. A written personalized care plan for preventive services as well as general preventive health  recommendations were provided to patient.     Barb Merino, LPN   1/61/0960   Nurse Notes: none  Due to this being a virtual visit, the after visit summary with patients personalized plan was offered to patient via mail or my-chart. to pick up at office at next visit

## 2023-02-16 ENCOUNTER — Telehealth: Payer: Self-pay | Admitting: Internal Medicine

## 2023-02-16 ENCOUNTER — Telehealth: Payer: Self-pay | Admitting: *Deleted

## 2023-02-16 DIAGNOSIS — H02052 Trichiasis without entropian right lower eyelid: Secondary | ICD-10-CM | POA: Diagnosis not present

## 2023-02-16 DIAGNOSIS — H04223 Epiphora due to insufficient drainage, bilateral lacrimal glands: Secondary | ICD-10-CM | POA: Diagnosis not present

## 2023-02-16 DIAGNOSIS — H0279 Other degenerative disorders of eyelid and periocular area: Secondary | ICD-10-CM | POA: Diagnosis not present

## 2023-02-16 DIAGNOSIS — H04422 Chronic lacrimal canaliculitis of left lacrimal passage: Secondary | ICD-10-CM | POA: Diagnosis not present

## 2023-02-16 DIAGNOSIS — Z01818 Encounter for other preprocedural examination: Secondary | ICD-10-CM | POA: Diagnosis not present

## 2023-02-16 NOTE — Telephone Encounter (Signed)
   Name: Terri [name]  DOB: Jun 16, 1945  MRN: [id number]  Primary Cardiologist: Maisie Fus, MD   Preoperative team, please contact this patient and set up a phone call appointment for further preoperative risk assessment. Please obtain consent and complete medication review. Thank you for your help.  I confirm that guidance regarding antiplatelet and oral anticoagulation therapy has been completed and, if necessary, noted below.  Per Dr. Wyline Mood, she may hold Aspirin for 5-7 days prior to procedure. Please resume Aspirin as soon as possible postprocedure, at the discretion of the surgeon.     Joylene Grapes, NP 02/16/2023, 1:10 PM  [facility]

## 2023-02-16 NOTE — Telephone Encounter (Signed)
Patient dropped off medical clearance form - will fax to [employer].  Thank you.

## 2023-02-16 NOTE — Telephone Encounter (Signed)
PROCEDURE:  LEFT LOWER LID PLASTIC REPAIR OF CANALICULAR WITH STENT

## 2023-02-16 NOTE — Telephone Encounter (Signed)
Clearance has been put in and send to the preop team to address.Terri [name]

## 2023-02-16 NOTE — Telephone Encounter (Signed)
1st attempt to reach pt regarding surgical clearance and the need for a tele visit.  Left a message for pt to call back and ask for the preop team. 

## 2023-02-16 NOTE — Telephone Encounter (Signed)
   Pre-operative Risk Assessment    Patient Name: Terri [name]  DOB: 02/10/1946 MRN: [id number]      Request for Surgical Clearance    Procedure:  CANNOT READ, HAVE LEFT A MESSAGE AND FAXED BACK TO RESEND  Date of Surgery:  Clearance TBD                                 Surgeon:  Bayard Males, MD Surgeon's Group or Practice Name:  [facility] Phone number:  [phone] Fax number:  [phone]    Type of Clearance Requested:   - Medical  - Pharmacy:  Hold Aspirin NOT INDICATED HOW LONG   Type of Anesthesia:  Not Indicated   Additional requests/questions:    Wilhemina Cash   02/16/2023, 10:18 AM

## 2023-02-17 NOTE — Telephone Encounter (Signed)
2nd attempt to reach the pt to set up tele pre op appt.   CORRECT SPELLING OF DOCTORS NAME IS DR. TAL RUBENSTEIN

## 2023-02-20 ENCOUNTER — Telehealth: Payer: Self-pay

## 2023-02-20 NOTE — Telephone Encounter (Signed)
Pt is scheduled for tele on 03/03/23 at 920am. Med rec and consent done

## 2023-02-20 NOTE — Telephone Encounter (Signed)
Patient is returning call and is requesting return call.

## 2023-02-20 NOTE — Telephone Encounter (Signed)
Pt is scheduled for tele on 03/03/23 at 920am. Med rec and consent done    Patient Consent for Virtual Visit        Terri [name] has provided verbal consent on 02/20/2023 for a virtual visit (video or telephone).   CONSENT FOR VIRTUAL VISIT FOR:  Terri [name]  By participating in this virtual visit I agree to the following:  I hereby voluntarily request, consent and authorize [facility] and its employed or contracted physicians, physician assistants, nurse practitioners or other licensed health care professionals (the Practitioner), to provide me with telemedicine health care services (the "Services") as deemed necessary by the treating Practitioner. I acknowledge and consent to receive the Services by the Practitioner via telemedicine. I understand that the telemedicine visit will involve communicating with the Practitioner through live audiovisual communication technology and the disclosure of certain medical information by electronic transmission. I acknowledge that I have been given the opportunity to request an in-person assessment or other available alternative prior to the telemedicine visit and am voluntarily participating in the telemedicine visit.  I understand that I have the right to withhold or withdraw my consent to the use of telemedicine in the course of my care at any time, without affecting my right to future care or treatment, and that the Practitioner or I may terminate the telemedicine visit at any time. I understand that I have the right to inspect all information obtained and/or recorded in the course of the telemedicine visit and may receive copies of available information for a reasonable fee.  I understand that some of the potential risks of receiving the Services via telemedicine include:  Delay or interruption in medical evaluation due to technological equipment failure or disruption; Information transmitted may not be sufficient (e.g. poor resolution of  images) to allow for appropriate medical decision making by the Practitioner; and/or  In rare instances, security protocols could fail, causing a breach of personal health information.  Furthermore, I acknowledge that it is my responsibility to provide information about my medical history, conditions and care that is complete and accurate to the best of my ability. I acknowledge that Practitioner's advice, recommendations, and/or decision may be based on factors not within their control, such as incomplete or inaccurate data provided by me or distortions of diagnostic images or specimens that may result from electronic transmissions. I understand that the practice of medicine is not an exact science and that Practitioner makes no warranties or guarantees regarding treatment outcomes. I acknowledge that a copy of this consent can be made available to me via my patient portal [facility] MyChart), or I can request a printed copy by calling the office of [facility].    I understand that my insurance will be billed for this visit.   I have read or had this consent read to me. I understand the contents of this consent, which adequately explains the benefits and risks of the Services being provided via telemedicine.  I have been provided ample opportunity to ask questions regarding this consent and the Services and have had my questions answered to my satisfaction. I give my informed consent for the services to be provided through the use of telemedicine in my medical care

## 2023-03-01 NOTE — Telephone Encounter (Signed)
Caller Irving Burton) reported patient is now scheduled for surgery on 10/2 and wants clearance faxed to fax# [phone] when completed.

## 2023-03-01 NOTE — Telephone Encounter (Signed)
Would recommend to keep telehealth visit as scheduled. Cannot make final commentary about holding aspirin until finalized virtual visit unless surgeon feels comfortable doing procedure on aspirin instead.

## 2023-03-03 ENCOUNTER — Ambulatory Visit: Payer: Medicare Other | Attending: Internal Medicine

## 2023-03-03 DIAGNOSIS — Z01810 Encounter for preprocedural cardiovascular examination: Secondary | ICD-10-CM

## 2023-03-03 NOTE — Progress Notes (Signed)
Virtual Visit via Telephone Note   Because of Terri [name]'s co-morbid illnesses, she is at least at moderate risk for complications without adequate follow up.  This format is felt to be most appropriate for this patient at this time.  The patient did not have access to video technology/had technical difficulties with video requiring transitioning to audio format only (telephone).  All issues noted in this document were discussed and addressed.  No physical exam could be performed with this format.  Please refer to the patient's chart for her consent to telehealth for [facility].  Evaluation Performed:  Preoperative cardiovascular risk assessment _____________   Date:  03/03/2023   Patient ID:  Terri [name], DOB 03-31-1946, MRN [id number] Patient Location:  Home Provider location:   Office  Primary Care Provider:  Mliss Sax, MD Primary Cardiologist:  Maisie Fus, MD  Chief Complaint / Patient Profile   77 y.o. y/o female with a h/o coronary artery disease who is pending LEFT LOWER LID PLASTIC REPAIR and presents today for telephonic preoperative cardiovascular risk assessment.  History of Present Illness    Terri [name] is a 77 y.o. female who presents via audio/video conferencing for a telehealth visit today.  Pt was last seen in cardiology clinic on 08/19/2022 by Dr. Wyline Mood.  At that time JENTRI HULBERT was doing well .  The patient is now pending procedure as outlined above. Since her last visit, she remains stable from a cardiac standpoint.  Today she denies chest pain, shortness of breath, lower extremity edema, fatigue, palpitations, melena, hematuria, hemoptysis, diaphoresis, weakness, presyncope, syncope, orthopnea, and PND.   Past Medical History    Past Medical History:  Diagnosis Date   Aortic insufficiency    a. Mild AI by echo 2014.   Arthritis    CAD (coronary artery disease)    a. Anterior STEMI 01/2015 s/p overlapping DES x2 to prox  LAD, DES to prox RCA in [facility].   CHF (congestive heart failure) (HCC)    Chronic back pain    stenosis/HNP   History of bronchitis 06/06/2012   History of colon polyps    Hyperlipidemia    Ischemic cardiomyopathy    Joint pain    Joint swelling    Myocardial infarction [facility])    Pneumonia 06/06/2012   Smokers' cough (HCC)    Stress headaches    Tobacco abuse    Past Surgical History:  Procedure Laterality Date   ABDOMINAL HYSTERECTOMY     at age 30   BREAST BIOPSY Right    roughly 30 years ago with [facility]    cataract surgery Bilateral    CHOLECYSTECTOMY     colonscopy     ERCP     ESOPHAGOGASTRODUODENOSCOPY     LUMBAR LAMINECTOMY Right 07/25/2014   Procedure: Right L4 Hemilaminectomy, Right L4-5 Laminotomy , Decompression, Microdisectomy;  Surgeon: Eldred Manges, MD;  Location: [facility] OR;  Service: Orthopedics;  Laterality: Right;   RIGHT/LEFT HEART CATH AND CORONARY ANGIOGRAPHY N/A 12/23/2021   Procedure: RIGHT/LEFT HEART CATH AND CORONARY ANGIOGRAPHY;  Surgeon: [place], Peter M, MD;  Location: [facility] INVASIVE CV LAB;  Service: Cardiovascular;  Laterality: N/A;   SHOULDER SURGERY Right    TONSILLECTOMY     as a child    Allergies  Allergies  Allergen Reactions   Erythromycin Nausea Only    Home Medications    Prior to Admission medications   Medication Sig Start Date End Date  Taking? Authorizing Provider  acetaminophen (TYLENOL) 325 MG tablet Take 325 mg by mouth as needed. 04/23/18   [provider]  albuterol (VENTOLIN HFA) 108 (90 Base) MCG/ACT inhaler Inhale 1-2 puffs into the lungs every 6 (six) hours as needed for wheezing or shortness of breath. And or cough. 12/23/21   Luciano Cutter, MD  atorvastatin (LIPITOR) 80 MG tablet Take 1 tablet (80 mg total) by mouth at bedtime. 08/19/22   Maisie Fus, MD  carvedilol (COREG) 6.25 MG tablet Take 1 tablet (6.25 mg total) by mouth 2 (two) times daily with a meal. 08/19/22   Maisie Fus, MD  [facility]  ASPIRIN 81 MG EC tablet Take 81 mg by mouth in the morning. 02/02/15   [provider]  fluticasone-salmeterol (WIXELA INHUB) 250-50 MCG/ACT AEPB Inhale 1 puff into the lungs in the morning and at bedtime. 05/18/22   Luciano Cutter, MD  furosemide (LASIX) 40 MG tablet Take 1.5 tablets (60 mg total) by mouth daily. 08/19/22   Maisie Fus, MD  lisinopril (ZESTRIL) 40 MG tablet Take 1 tablet (40 mg total) by mouth daily. 09/14/22   Maisie Fus, MD  Magnesium 300 MG CAPS Take 150 mg by mouth daily. Take 0.5 tablet daily    [provider]  potassium chloride 20 MEQ TBCR Take 1 tablet (20 mEq total) by mouth daily. 08/19/22   Maisie Fus, MD  valACYclovir (VALTREX) 1000 MG tablet Take 500 mg by mouth 2 (two) times daily as needed. 07/24/22   [provider]    Physical Exam    Vital Signs:  YULEIDI SEHER does not have vital signs available for review today.  Given telephonic nature of communication, physical exam is limited. AAOx3. NAD. Normal affect.  Speech and respirations are unlabored.  Accessory Clinical Findings    None  Assessment & Plan    1.  Preoperative Cardiovascular Risk Assessment:LEFT LOWER LID PLASTIC REPAIR OF CANALICULAR WITH STENT , TAT RUBINSTEIN, MD ,  [facility]  Fax number:  [phone]      Primary Cardiologist: Maisie Fus, MD  Chart reviewed as part of pre-operative protocol coverage. Given past medical history and time since last visit, based on ACC/AHA guidelines, TONAE GEORGI would be at acceptable risk for the planned procedure without further cardiovascular testing.   Her aspirin may be held for 5 to 7 days prior to her procedure please resume as soon as hemostasis is achieved.   Patient was advised that if she develops new symptoms prior to surgery to contact our office to arrange a follow-up appointment.  She verbalized understanding.  I will route this recommendation to the requesting party via Epic fax  function and remove from pre-op pool.       Time:   Today, I have spent 5 minutes with the patient with telehealth technology discussing medical history, symptoms, and management plan.  Prior to patient's phone evaluation I spent greater than 10 minutes reviewing their past medical history and cardiac medications.    Ronney Asters, NP  03/03/2023, 7:21 AM

## 2023-03-08 DIAGNOSIS — H04223 Epiphora due to insufficient drainage, bilateral lacrimal glands: Secondary | ICD-10-CM | POA: Diagnosis not present

## 2023-03-08 DIAGNOSIS — H0279 Other degenerative disorders of eyelid and periocular area: Secondary | ICD-10-CM | POA: Diagnosis not present

## 2023-03-08 DIAGNOSIS — H019 Unspecified inflammation of eyelid: Secondary | ICD-10-CM | POA: Diagnosis not present

## 2023-03-08 DIAGNOSIS — H04222 Epiphora due to insufficient drainage, left lacrimal gland: Secondary | ICD-10-CM | POA: Diagnosis not present

## 2023-03-08 DIAGNOSIS — H04422 Chronic lacrimal canaliculitis of left lacrimal passage: Secondary | ICD-10-CM | POA: Diagnosis not present

## 2023-03-08 DIAGNOSIS — H02052 Trichiasis without entropian right lower eyelid: Secondary | ICD-10-CM | POA: Diagnosis not present

## 2023-03-08 DIAGNOSIS — Z01818 Encounter for other preprocedural examination: Secondary | ICD-10-CM | POA: Diagnosis not present

## 2023-05-10 ENCOUNTER — Other Ambulatory Visit: Payer: Self-pay | Admitting: Internal Medicine

## 2023-07-17 ENCOUNTER — Other Ambulatory Visit: Payer: Self-pay | Admitting: Family Medicine

## 2023-07-20 ENCOUNTER — Ambulatory Visit (INDEPENDENT_AMBULATORY_CARE_PROVIDER_SITE_OTHER): Payer: Medicare Other | Admitting: Family Medicine

## 2023-07-20 ENCOUNTER — Encounter: Payer: Self-pay | Admitting: Family Medicine

## 2023-07-20 VITALS — BP 114/64 | HR 64 | Temp 97.4°F | Ht 62.0 in | Wt 180.6 lb

## 2023-07-20 DIAGNOSIS — H04422 Chronic lacrimal canaliculitis of left lacrimal passage: Secondary | ICD-10-CM | POA: Diagnosis not present

## 2023-07-20 DIAGNOSIS — M79605 Pain in left leg: Secondary | ICD-10-CM | POA: Diagnosis not present

## 2023-07-20 DIAGNOSIS — B009 Herpesviral infection, unspecified: Secondary | ICD-10-CM | POA: Diagnosis not present

## 2023-07-20 DIAGNOSIS — H0279 Other degenerative disorders of eyelid and periocular area: Secondary | ICD-10-CM | POA: Diagnosis not present

## 2023-07-20 MED ORDER — VALACYCLOVIR HCL 1 G PO TABS
500.0000 mg | ORAL_TABLET | Freq: Two times a day (BID) | ORAL | 3 refills | Status: AC
Start: 2023-07-20 — End: 2023-07-27

## 2023-07-20 NOTE — Progress Notes (Addendum)
 Established Patient Office Visit   Subjective:  Patient ID: Terri [name], female    DOB: 12/09/1945  Age: 78 y.o. MRN: [id number]  Chief Complaint  Patient presents with   Leg Pain    Left leg pain and calf cramps x 2 months.     Leg Pain  Associated symptoms include tingling.   Encounter Diagnoses  Name Primary?   Leg pain, posterior, left Yes   Herpes virus disease    Presents for evaluation of an acute episode of left calf pain a few weeks ago that spontaneously resolved.  It had been preceded by acute lower back pain a few days prior.  There was associated tingling in the calf and left foot.  There is mild residual tingling in her left foot at this time.  History of degenerative disc disease.  MRI of 2022 did show foraminal stenosis with disc protrusions at L3-L4 and L4-L5 to the left.  Left leg feels weak at times.  No change in her bowel or bladder function.  No saddle paresthesias.  Continues follow-up with Dr. Barbarann.  She continues to experience nocturnal cramps in the calf muscles on the left from time to time.  ABI studies from 2022 of the lower extremities were abnormal.  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  Continues to use it on a as needed basis.   Review of Systems  Constitutional: Negative.   HENT: Negative.    Eyes:  Negative for blurred vision, discharge and redness.  Respiratory: Negative.    Cardiovascular: Negative.   Gastrointestinal:  Negative for abdominal pain.  Genitourinary: Negative.   Musculoskeletal: Negative.  Negative for myalgias.  Skin:  Negative for rash.  Neurological:  Positive for tingling and weakness. Negative for loss of consciousness.  Endo/Heme/Allergies:  Negative for polydipsia.     Current Outpatient Medications:    acetaminophen  (TYLENOL ) 325 MG tablet, Take 325 mg by mouth as needed., Disp: , Rfl:    albuterol  (VENTOLIN  HFA) 108 (90 Base) MCG/ACT inhaler, Inhale 1-2 puffs into the lungs every 6 (six) hours as  needed for wheezing or shortness of breath. And or cough., Disp: 1 each, Rfl: 2   atorvastatin  (LIPITOR ) 80 MG tablet, Take 1 tablet (80 mg total) by mouth at bedtime., Disp: 90 tablet, Rfl: 3   carvedilol  (COREG ) 6.25 MG tablet, Take 1 tablet (6.25 mg total) by mouth 2 (two) times daily with a meal., Disp: 180 tablet, Rfl: 3   [facility] ASPIRIN  81 MG EC tablet, Take 81 mg by mouth in the morning., Disp: , Rfl: 0   fluticasone -salmeterol (WIXELA INHUB) 250-50 MCG/ACT AEPB, Inhale 1 puff into the lungs in the morning and at bedtime., Disp: 60 each, Rfl: 5   furosemide  (LASIX ) 40 MG tablet, Take 1.5 tablets (60 mg total) by mouth daily., Disp: 135 tablet, Rfl: 3   lisinopril  (ZESTRIL ) 40 MG tablet, Take 1 tablet by mouth once daily, Disp: 90 tablet, Rfl: 0   Magnesium 300 MG CAPS, Take 150 mg by mouth daily. Take 0.5 tablet daily, Disp: , Rfl:    potassium chloride  20 MEQ TBCR, Take 1 tablet (20 mEq total) by mouth daily., Disp: 90 tablet, Rfl: 3   valACYclovir  (VALTREX ) 1000 MG tablet, Take 0.5 tablets (500 mg total) by mouth 2 (two) times daily for 7 days., Disp: 7 tablet, Rfl: 3   Objective:     BP 114/64   Pulse 64   Temp (!) 97.4 F (36.3 C)  Ht 5' 2 (1.575 m)   Wt 180 lb 9.6 oz (81.9 kg)   SpO2 98%   BMI 33.03 kg/m    Physical Exam Constitutional:      General: She is not in acute distress.    Appearance: Normal appearance. She is not ill-appearing, toxic-appearing or diaphoretic.  HENT:     Head: Normocephalic and atraumatic.     Right Ear: External ear normal.     Left Ear: External ear normal.  Eyes:     General: No scleral icterus.       Right eye: No discharge.        Left eye: No discharge.     Extraocular Movements: Extraocular movements intact.     Conjunctiva/sclera: Conjunctivae normal.  Pulmonary:     Effort: Pulmonary effort is normal. No respiratory distress.  Musculoskeletal:     Lumbar back: No tenderness or bony tenderness. Normal range of motion. Negative  right straight leg raise test and negative left straight leg raise test.  Skin:    General: Skin is warm and dry.  Neurological:     Mental Status: She is alert and oriented to person, place, and time.     Motor: No weakness.     Deep Tendon Reflexes:     Reflex Scores:      Patellar reflexes are 2+ on the right side and 0 on the left side.      Achilles reflexes are 1+ on the right side and 1+ on the left side. Psychiatric:        Mood and Affect: Mood normal.        Behavior: Behavior normal.      No results found for any visits on 07/20/23.    The ASCVD Risk score (Arnett DK, et al., 2019) failed to calculate for the following reasons:   Risk score cannot be calculated because patient has a medical history suggesting prior/existing ASCVD    Assessment & Plan:   Leg pain, posterior, left -     Ambulatory referral to Orthopedic Surgery  Herpes virus disease -     valACYclovir  HCl; Take 0.5 tablets (500 mg total) by mouth 2 (two) times daily for 7 days.  Dispense: 7 tablet; Refill: 3    No follow-ups on file.    [name], MD  2/20 addendum: Orthopedic referral coordinator left note the patient has been called 3 times and has not responded.

## 2023-08-07 NOTE — Telephone Encounter (Signed)
 Gerlean Ren B from [facility] is requesting a callback to f/u on receiving the clearance being faxed over to them. She stated they've been waiting since last year and hadn't had any updates but pt is scheduled to have procedure done on 08/09/2023. She'd like a callback at [phone] but faxed to [phone]. Please advise

## 2023-08-07 NOTE — Telephone Encounter (Signed)
 Pt has appt 08/22/23 with Dr. Carolan Clines, see notes from preop APP today.    I will update all parties involved.

## 2023-08-07 NOTE — Telephone Encounter (Signed)
 I will forward to pre op APP to review. Pt had tele 03/03/23.

## 2023-08-07 NOTE — Telephone Encounter (Signed)
   Name: Terri [name]  DOB: [birth year]  MRN: [id number]  Primary Cardiologist: Maisie Fus, MD  Chart reviewed as part of pre-operative protocol coverage. Because of Terri [name]'s past medical history and time since last visit, she will require a follow-up in-office visit in order to better assess preoperative cardiovascular risk. Pt is due for annual follow-up. Also, will need updated clearance request as request  is > 2 months old.   Pre-op covering staff: - Please schedule appointment and call patient to inform them. If patient already had an upcoming appointment within acceptable timeframe, please add "pre-op clearance" to the appointment notes so provider is aware. - Please contact requesting surgeon's office via preferred method (i.e, phone, fax) to inform them of need for appointment prior to surgery.   Joylene Grapes, NP  08/07/2023, 12:58 PM

## 2023-08-22 ENCOUNTER — Ambulatory Visit: Payer: Medicare Other | Attending: Internal Medicine | Admitting: Internal Medicine

## 2023-08-22 ENCOUNTER — Encounter: Payer: Self-pay | Admitting: Internal Medicine

## 2023-08-22 VITALS — BP 152/80 | HR 69 | Ht 62.0 in | Wt 175.4 lb

## 2023-08-22 DIAGNOSIS — I2510 Atherosclerotic heart disease of native coronary artery without angina pectoris: Secondary | ICD-10-CM | POA: Diagnosis not present

## 2023-08-22 DIAGNOSIS — I10 Essential (primary) hypertension: Secondary | ICD-10-CM

## 2023-08-22 MED ORDER — LISINOPRIL 40 MG PO TABS: 40.0000 mg | ORAL_TABLET | Freq: Every day | ORAL | 3 refills | Status: DC

## 2023-08-22 MED ORDER — ATORVASTATIN CALCIUM 80 MG PO TABS: 80.0000 mg | ORAL_TABLET | Freq: Every day | ORAL | 3 refills | Status: DC

## 2023-08-22 MED ORDER — FUROSEMIDE 40 MG PO TABS
60.0000 mg | ORAL_TABLET | Freq: Every day | ORAL | 3 refills | Status: DC
Start: 2023-08-22 — End: 2024-02-23

## 2023-08-22 MED ORDER — POTASSIUM CHLORIDE ER 20 MEQ PO TBCR: 20.0000 meq | EXTENDED_RELEASE_TABLET | Freq: Every day | ORAL | 3 refills | Status: DC

## 2023-08-22 MED ORDER — CARVEDILOL 6.25 MG PO TABS: 6.2500 mg | ORAL_TABLET | Freq: Two times a day (BID) | ORAL | 3 refills | Status: DC

## 2023-08-22 NOTE — Patient Instructions (Signed)
 Medication Instructions:  Your physician recommends that you continue on your current medications as directed. Please refer to the Current Medication list given to you today.  *If you need a refill on your cardiac medications before your next appointment, please call your pharmacy*   Follow-Up: At [facility], you and your health needs are our priority.  As part of our continuing mission to provide you with exceptional heart care, we have created designated Provider Care Teams.  These Care Teams include your primary Cardiologist (physician) and Advanced Practice Providers (APPs -  Physician Assistants and Nurse Practitioners) who all work together to provide you with the care you need, when you need it.  We recommend signing up for the patient portal called "MyChart".  Sign up information is provided on this After Visit Summary.  MyChart is used to connect with patients for Virtual Visits (Telemedicine).  Patients are able to view lab/test results, encounter notes, upcoming appointments, etc.  Non-urgent messages can be sent to your provider as well.   To learn more about what you can do with MyChart, go to [email or web address].    Your next appointment:   6 month(s)  Provider:   Any APP  Other Instructions

## 2023-08-22 NOTE — Progress Notes (Signed)
 Cardiology Office Note:    Date:  08/22/2023   ID:  Terri [name], DOB Oct 24, 1945, MRN [id number]  PCP:  Mliss Sax, MD   [facility] Providers Cardiologist:  Maisie Fus, MD     Referring MD: Mliss Sax   No chief complaint on file. Follow up  History of Present Illness:    Terri [name] is a 78 y.o. female with a hx of smoking, CAD s/p anterior stemi 01/2015 DESx2 prox LAD, recent DES to prox and mid  RCA  She brings in DC summary from [facility]. She presented with palpitations and SOB to the hospital there. She also reported indigestion.. She underwent LHC, PCI of an occluded mid and prox RCA (written on  the back of her DC summary). Noted to be 99% on  a voicemail from the internationalist. Echo showed EF 40%, mild MR, no aortic stenosis or AI, no pericardial effusion. Her husband noted that her blood pressure dropped suddenly.  She had CPR. No shockable rhythm. This was March 24 , 2023.  She feels much better. She's on DAPT. Her blood pressure is well controlled. Still smoking. No PND. No orthopnea. No Le edema.   Interval Hx 12/21/2021: She went to the ED for  mid-sternal chest pain 6/24. She was given nitro. She said her arm hurt and she was diaphoretic. Her chest pain resolved. EKG did not show any ischemic changes. Her troponin was very minimal.  Today, she notes no energy. She sleeps a lot. She is fatigued with minimal activity.  She notes both sides of her chest aches. She denies orthopnea, PND. No LE edema. She has significant shortness of breath with minimal activity.  She feels congested. She is coughing up clear phlegm which is chronic. She states these symptoms are the same ones she had prior to her PCI in March and have persisted.   Interval Hx 01/21/2022 [name] underwent LHC. She did not have re-stenosis. Her wedge was normal. She feels better and more energetic. She is down to 4 cigarettes  per day.  Interim hx 08/19/2022 No chest  pain. No progressive DOE. She is feeling a lot better. Her husband has been having PNA and not doing well.  Interim hx 08/22/2023 She denies any hospital visits. She denies any CP. Some SOB. Still smoking. She is helping with her husband, and is having a hard time.  BP up today. She will keep track of this.      Cardiology Studies LHC 12/23/2021: non obstructive CAD, patent stents in the prox to mid LAD, ostial RCA to the PL Cherish Runde of the RCA. Wedge was normal. Normal CI  Myoview-08/10/2021-large defect with moderate intensity in the apical to basal anterior, anteroseptal and apex that's fixed  06/11/2021-TTE- EF 40-45%, mildly reduced. 04/28/2021- EF 50-55, RV is normal, mild-mod MR, AI mild to moderate  Past Medical History:  Diagnosis Date   Aortic insufficiency    a. Mild AI by echo 2014.   Arthritis    CAD (coronary artery disease)    a. Anterior STEMI 01/2015 s/p overlapping DES x2 to prox LAD, DES to prox RCA in [facility].   CHF (congestive heart failure) (HCC)    Chronic back pain    stenosis/HNP   History of bronchitis 06/06/2012   History of colon polyps    Hyperlipidemia    Ischemic cardiomyopathy    Joint pain    Joint swelling    Myocardial infarction (HCC)  Pneumonia 06/06/2012   Smokers' cough (HCC)    Stress headaches    Tobacco abuse     Past Surgical History:  Procedure Laterality Date   ABDOMINAL HYSTERECTOMY     at age 32   BREAST BIOPSY Right    roughly 30 years ago with [facility]    cataract surgery Bilateral    CHOLECYSTECTOMY     colonscopy     ERCP     ESOPHAGOGASTRODUODENOSCOPY     LUMBAR LAMINECTOMY Right 07/25/2014   Procedure: Right L4 Hemilaminectomy, Right L4-5 Laminotomy , Decompression, Microdisectomy;  Surgeon: Eldred Manges, MD;  Location: [facility] OR;  Service: Orthopedics;  Laterality: Right;   RIGHT/LEFT HEART CATH AND CORONARY ANGIOGRAPHY N/A 12/23/2021   Procedure: RIGHT/LEFT HEART CATH AND CORONARY ANGIOGRAPHY;  Surgeon:  Swaziland, Peter M, MD;  Location: [facility] INVASIVE CV LAB;  Service: Cardiovascular;  Laterality: N/A;   SHOULDER SURGERY Right    TONSILLECTOMY     as a child    Current Medications: Current Outpatient Medications on File Prior to Visit  Medication Sig Dispense Refill   acetaminophen (TYLENOL) 325 MG tablet Take 325 mg by mouth as needed.     albuterol (VENTOLIN HFA) 108 (90 Base) MCG/ACT inhaler Inhale 1-2 puffs into the lungs every 6 (six) hours as needed for wheezing or shortness of breath. And or cough. 1 each 2   atorvastatin (LIPITOR) 80 MG tablet Take 1 tablet (80 mg total) by mouth at bedtime. 90 tablet 3   carvedilol (COREG) 6.25 MG tablet Take 1 tablet (6.25 mg total) by mouth 2 (two) times daily with a meal. 180 tablet 3   [employer] ASPIRIN 81 MG EC tablet Take 81 mg by mouth in the morning.  0   fluticasone-salmeterol (WIXELA INHUB) 250-50 MCG/ACT AEPB Inhale 1 puff into the lungs in the morning and at bedtime. 60 each 5   furosemide (LASIX) 40 MG tablet Take 1.5 tablets (60 mg total) by mouth daily. 135 tablet 3   lisinopril (ZESTRIL) 40 MG tablet Take 1 tablet by mouth once daily 90 tablet 0   Magnesium 300 MG CAPS Take 150 mg by mouth daily. Take 0.5 tablet daily     potassium chloride 20 MEQ TBCR Take 1 tablet (20 mEq total) by mouth daily. 90 tablet 3   No current facility-administered medications on file prior to visit.     Allergies:   Erythromycin   Social History   Socioeconomic History   Marital status: Married    Spouse name: Anh Bigos   Number of children: Not on file   Years of education: Not on file   Highest education level: Not on file  Occupational History   Occupation: retired  Tobacco Use   Smoking status: Every Day    Current packs/day: 0.50    Average packs/day: 0.5 packs/day for 60.0 years (30.0 ttl pk-yrs)    Types: Cigarettes   Smokeless tobacco: Never   Tobacco comments:    08/19/2022 Patient smoke 7-10 cigarettes daily  Vaping Use   Vaping  status: Never Used  Substance and Sexual Activity   Alcohol use: No   Drug use: No   Sexual activity: Yes    Birth control/protection: Surgical  Other Topics Concern   Not on file  Social History Narrative   Not on file   Social Drivers of Health   Financial Resource Strain: Low Risk  (10/21/2022)   Overall Financial Resource Strain (CARDIA)    Difficulty of Paying Living Expenses:  Not hard at all  Food Insecurity: No Food Insecurity (10/21/2022)   Hunger Vital Sign    Worried About Running Out of Food in the Last Year: Never true    Ran Out of Food in the Last Year: Never true  Transportation Needs: No Transportation Needs (10/21/2022)   PRAPARE - Administrator, Civil Service (Medical): No    Lack of Transportation (Non-Medical): No  Physical Activity: Inactive (10/21/2022)   Exercise Vital Sign    Days of Exercise per Week: 0 days    Minutes of Exercise per Session: 0 min  Stress: No Stress Concern Present (10/21/2022)   [employer] of Occupational Health - Occupational Stress Questionnaire    Feeling of Stress : Not at all  Social Connections: Moderately Integrated (10/13/2021)   Social Connection and Isolation Panel [NHANES]    Frequency of Communication with Friends and Family: Three times a week    Frequency of Social Gatherings with Friends and Family: Three times a week    Attends Religious Services: More than 4 times per year    Active Member of Clubs or Organizations: No    Attends Banker Meetings: Never    Marital Status: Married     Family History: The patient's family history includes Breast cancer in her sister; CAD in an other family member; Heart disease in her father and mother; Heart failure in her father and mother.  ROS:   Please see the history of present illness.     All other systems reviewed and are negative.  EKGs/Labs/Other Studies Reviewed:    The following studies were reviewed today:   EKG:  EKG is  ordered  today.  The ekg ordered today demonstrates   EKG 09/17/2021 - NSR, minimal LVH  EKG: 01/21/2022- NSR, LAD, LVH, septal Q  EKG: 08/19/2022- NSR, LAD, LVH with repol  Recent Labs: No results found for requested labs within last 365 days.   Recent Lipid Panel    Component Value Date/Time   CHOL 112 07/29/2021 1507   CHOL 133 08/14/2020 1010   TRIG 148.0 07/29/2021 1507   HDL 39.10 07/29/2021 1507   HDL 41 08/14/2020 1010   CHOLHDL 3 07/29/2021 1507   VLDL 29.6 07/29/2021 1507   LDLCALC 44 07/29/2021 1507   LDLCALC 63 08/14/2020 1010     Risk Assessment/Calculations:           Physical Exam:    VS:   Vitals:   08/22/23 1343  BP: (!) 152/80  Pulse: 69  SpO2: 96%     Wt Readings from Last 3 Encounters:  07/20/23 180 lb 9.6 oz (81.9 kg)  10/21/22 170 lb (77.1 kg)  08/19/22 178 lb (80.7 kg)     GEN:  Well nourished, well developed in no acute distress HEENT: Normal NECK: No JVD;  LYMPHATICS: No lymphadenopathy CARDIAC: RRR, no murmurs, rubs, gallops RESPIRATORY:  scant + expiratory wheezes ABDOMEN: Soft, non-tender, non-distended MUSCULOSKELETAL:  No edema; No deformity  Vasc: Right radial 2+ SKIN: Warm and dry NEUROLOGIC:  Alert and oriented x 3 PSYCHIATRIC:  Normal affect   ASSESSMENT:    Ischemic cardiomyopathy NYHA Class III: [id number].  s/p anterior stemi 01/2015 DESx2 prox LAD, recent DES to prox and mid  RCA 99% s/p PCI 08/27/2021.  Stents are patent. Her wedge was normal with LHC  Jardiance 10 mg daily was stopped 2/2 cost. Continue asa. Can stop plavix today. Can continue lasix 60 mg daily. K 20 meq.  Left lower Lid repair;Preop She is acceptable cardiac risk for this low risk procedure. See note 03/03/2023  HTN: typically well controlled at home 130s, it was high today we will plan to have her monitor it. continue lisinopril 40 mg daily and coreg 6.25 mg BID.   HLD:  Continue atorvastatin 80 mg daily . LDL 44 07/29/2021  Smoking Cessation: Not ready  to quit  PLAN:    In order of problems listed above:  Follow up 6 months with an APP        Medication Adjustments/Labs and Tests Ordered: Current medicines are reviewed at length with the patient today.  Concerns regarding medicines are outlined above.  No orders of the defined types were placed in this encounter.  No orders of the defined types were placed in this encounter.   There are no Patient Instructions on file for this visit.   Signed, Maisie Fus, MD  08/22/2023 1:22 PM    [facility]

## 2023-09-06 DIAGNOSIS — H04512 Dacryolith of left lacrimal passage: Secondary | ICD-10-CM | POA: Diagnosis not present

## 2023-09-06 DIAGNOSIS — H04422 Chronic lacrimal canaliculitis of left lacrimal passage: Secondary | ICD-10-CM | POA: Diagnosis not present

## 2023-09-06 DIAGNOSIS — H0270 Unspecified degenerative disorders of eyelid and periocular area: Secondary | ICD-10-CM | POA: Diagnosis not present

## 2023-09-27 DIAGNOSIS — H0279 Other degenerative disorders of eyelid and periocular area: Secondary | ICD-10-CM | POA: Diagnosis not present

## 2023-09-27 DIAGNOSIS — Z09 Encounter for follow-up examination after completed treatment for conditions other than malignant neoplasm: Secondary | ICD-10-CM | POA: Diagnosis not present

## 2023-09-27 DIAGNOSIS — H04422 Chronic lacrimal canaliculitis of left lacrimal passage: Secondary | ICD-10-CM | POA: Diagnosis not present

## 2023-10-27 ENCOUNTER — Other Ambulatory Visit: Payer: Self-pay

## 2023-10-27 ENCOUNTER — Ambulatory Visit (INDEPENDENT_AMBULATORY_CARE_PROVIDER_SITE_OTHER): Payer: Medicare Other

## 2023-10-27 DIAGNOSIS — J441 Chronic obstructive pulmonary disease with (acute) exacerbation: Secondary | ICD-10-CM

## 2023-10-27 DIAGNOSIS — Z0000 Encounter for general adult medical examination without abnormal findings: Secondary | ICD-10-CM | POA: Diagnosis not present

## 2023-10-27 MED ORDER — ALBUTEROL SULFATE HFA 108 (90 BASE) MCG/ACT IN AERS: 1.0000 | INHALATION_SPRAY | Freq: Four times a day (QID) | RESPIRATORY_TRACT | 2 refills | Status: AC | PRN

## 2023-10-27 NOTE — Progress Notes (Signed)
 Subjective:   SHILEE BIGGS is a 78 y.o. who presents for a Medicare Wellness preventive visit.  As a reminder, Annual Wellness Visits don't include a physical exam, and some assessments may be limited, especially if this visit is performed virtually. We may recommend an in-person follow-up visit with your provider if needed.  Visit Complete: Virtual I connected with  Mariette Shorts on 10/27/23 by a audio enabled telemedicine application and verified that I am speaking with the correct person using two identifiers.  Patient Location: Home  Provider Location: Office/Clinic  I discussed the limitations of evaluation and management by telemedicine. The patient expressed understanding and agreed to proceed.  Vital Signs: Because this visit was a virtual/telehealth visit, some criteria may be missing or patient reported. Any vitals not documented were not able to be obtained and vitals that have been documented are patient reported.  VideoError- Librarian, academic were attempted between this provider and patient, however failed, due to patient having technical difficulties OR patient did not have access to video capability.  We continued and completed visit with audio only.   Persons Participating in Visit: Patient.  AWV Questionnaire: No: Patient Medicare AWV questionnaire was not completed prior to this visit.  Cardiac Risk Factors include: advanced age (>23men, >89 women);dyslipidemia;hypertension     Objective:     Today's Vitals   There is no height or weight on file to calculate BMI.     10/27/2023   10:16 AM 10/21/2022   10:16 AM 12/23/2021   12:08 PM 11/27/2021   12:20 AM 10/13/2021    8:25 AM 06/17/2021    1:11 PM 06/11/2021    6:50 AM  Advanced Directives  Does Patient Have a Medical Advance Directive? Yes Yes No No No No No  Type of Estate agent of [place];Living will Healthcare Power of [place];Living will       Copy of  Healthcare Power of Attorney in Chart? No - copy requested No - copy requested       Would patient like information on creating a medical advance directive?     No - Patient declined  No - Patient declined    Current Medications (verified) Outpatient Encounter Medications as of 10/27/2023  Medication Sig   acetaminophen  (TYLENOL ) 325 MG tablet Take 325 mg by mouth as needed.   albuterol  (VENTOLIN  HFA) 108 (90 Base) MCG/ACT inhaler Inhale 1-2 puffs into the lungs every 6 (six) hours as needed for wheezing or shortness of breath. And or cough.   atorvastatin  (LIPITOR ) 80 MG tablet Take 1 tablet (80 mg total) by mouth at bedtime.   carvedilol  (COREG ) 6.25 MG tablet Take 1 tablet (6.25 mg total) by mouth 2 (two) times daily with a meal.   [facility] ASPIRIN  81 MG EC tablet Take 81 mg by mouth in the morning.   fluticasone -salmeterol (WIXELA INHUB) 250-50 MCG/ACT AEPB Inhale 1 puff into the lungs in the morning and at bedtime.   furosemide  (LASIX ) 40 MG tablet Take 1.5 tablets (60 mg total) by mouth daily.   lisinopril  (ZESTRIL ) 40 MG tablet Take 1 tablet (40 mg total) by mouth daily.   Magnesium 300 MG CAPS Take 150 mg by mouth daily. Take 0.5 tablet daily   Potassium Chloride  ER 20 MEQ TBCR Take 1 tablet (20 mEq total) by mouth daily.   No facility-administered encounter medications on file as of 10/27/2023.    Allergies (verified) Erythromycin   History: Past Medical History:  Diagnosis  Date   Aortic insufficiency    a. Mild AI by echo 2014.   Arthritis    CAD (coronary artery disease)    a. Anterior STEMI 01/2015 s/p overlapping DES x2 to prox LAD, DES to prox RCA in [facility].   CHF (congestive heart failure) (HCC)    Chronic back pain    stenosis/HNP   History of bronchitis 06/06/2012   History of colon polyps    Hyperlipidemia    Ischemic cardiomyopathy    Joint pain    Joint swelling    Myocardial infarction [facility])    Pneumonia 06/06/2012   Smokers' cough (HCC)     Stress headaches    Tobacco abuse    Past Surgical History:  Procedure Laterality Date   ABDOMINAL HYSTERECTOMY     at age 3   BREAST BIOPSY Right    roughly 30 years ago with [facility]    cataract surgery Bilateral    CHOLECYSTECTOMY     colonscopy     ERCP     ESOPHAGOGASTRODUODENOSCOPY     LUMBAR LAMINECTOMY Right 07/25/2014   Procedure: Right L4 Hemilaminectomy, Right L4-5 Laminotomy , Decompression, Microdisectomy;  Surgeon: Adah Acron, MD;  Location: [facility] OR;  Service: Orthopedics;  Laterality: Right;   RIGHT/LEFT HEART CATH AND CORONARY ANGIOGRAPHY N/A 12/23/2021   Procedure: RIGHT/LEFT HEART CATH AND CORONARY ANGIOGRAPHY;  Surgeon: Swaziland, Peter M, MD;  Location: [facility] INVASIVE CV LAB;  Service: Cardiovascular;  Laterality: N/A;   SHOULDER SURGERY Right    TONSILLECTOMY     as a child   Family History  Problem Relation Age of Onset   Heart disease Mother    Heart failure Mother    Heart disease Father    Heart failure Father    CAD Other        both parents and multiple siblings with either died or had-related issues   Breast cancer Sister    Social History   Socioeconomic History   Marital status: Married    Spouse name: Maren Wiesen   Number of children: Not on file   Years of education: Not on file   Highest education level: Not on file  Occupational History   Occupation: retired  Tobacco Use   Smoking status: Every Day    Current packs/day: 0.50    Average packs/day: 0.5 packs/day for 60.0 years (30.0 ttl pk-yrs)    Types: Cigarettes   Smokeless tobacco: Never   Tobacco comments:    08/19/2022 Patient smoke 7-10 cigarettes daily  Vaping Use   Vaping status: Never Used  Substance and Sexual Activity   Alcohol use: No   Drug use: No   Sexual activity: Yes    Birth control/protection: Surgical  Other Topics Concern   Not on file  Social History Narrative   Not on file   Social Drivers of Health   Financial Resource Strain: Low Risk  (10/27/2023)    Overall Financial Resource Strain (CARDIA)    Difficulty of Paying Living Expenses: Not hard at all  Food Insecurity: No Food Insecurity (10/27/2023)   Hunger Vital Sign    Worried About Running Out of Food in the Last Year: Never true    Ran Out of Food in the Last Year: Never true  Transportation Needs: No Transportation Needs (10/21/2022)   PRAPARE - Administrator, Civil Service (Medical): No    Lack of Transportation (Non-Medical): No  Physical Activity: Inactive (10/27/2023)   Exercise Vital  Sign    Days of Exercise per Week: 0 days    Minutes of Exercise per Session: 0 min  Stress: Stress Concern Present (10/27/2023)   [employer] of Occupational Health - Occupational Stress Questionnaire    Feeling of Stress : To some extent  Social Connections: Moderately Isolated (10/27/2023)   Social Connection and Isolation Panel [NHANES]    Frequency of Communication with Friends and Family: More than three times a week    Frequency of Social Gatherings with Friends and Family: Once a week    Attends Religious Services: Never    Database administrator or Organizations: No    Attends Banker Meetings: Never    Marital Status: Married    Tobacco Counseling Ready to quit: Yes Counseling given: Not Answered Tobacco comments: 08/19/2022 Patient smoke 7-10 cigarettes daily    Clinical Intake:  Pre-visit preparation completed: Yes  Pain : No/denies pain     Nutritional Risks: None Diabetes: No  Lab Results  Component Value Date   HGBA1C 6.0 02/27/2015     How often do you need to have someone help you when you read instructions, pamphlets, or other written materials from your doctor or pharmacy?: 1 - Never  Interpreter Needed?: No  Information entered by :: NAllen LPN   Activities of Daily Living     10/27/2023   10:12 AM  In your present state of health, do you have any difficulty performing the following activities:  Hearing? 1  Comment  wears hearing aids  Vision? 0  Difficulty concentrating or making decisions? 0  Walking or climbing stairs? 0  Dressing or bathing? 0  Doing errands, shopping? 0  Preparing Food and eating ? N  Using the Toilet? N  In the past six months, have you accidently leaked urine? Y  Comment with cough and laugh  Do you have problems with loss of bowel control? N  Managing your Medications? N  Managing your Finances? N  Housekeeping or managing your Housekeeping? N    Patient Care Team: Tonna Frederic, MD as PCP - General (Family Medicine) Amanda Jungling Tomas Fountain, MD as PCP - Cardiology (Cardiology) [facility]  Indicate any recent Medical Services you may have received from other than [facility] providers in the past year (date may be approximate).     Assessment:    This is a routine wellness examination for Mumtaz.  Hearing/Vision screen Hearing Screening - Comments:: Has hearing aids that are maintained Vision Screening - Comments:: Regular eye exams, [facility]   Goals Addressed   None    Depression Screen     10/27/2023   10:19 AM 10/21/2022   10:17 AM 06/27/2022    3:59 PM 11/02/2021    3:14 PM 10/13/2021    8:24 AM 08/31/2021   11:28 AM 07/29/2021    2:28 PM  PHQ 2/9 Scores  PHQ - 2 Score 3 0 0 0 0 0 0  PHQ- 9 Score 6          Fall Risk     10/27/2023   10:18 AM 10/21/2022   10:17 AM 06/27/2022    3:59 PM 11/02/2021    3:14 PM 10/13/2021    8:26 AM  Fall Risk   Falls in the past year? 0 0 0 0 0  Number falls in past yr: 0 0 0 0 0  Injury with Fall? 0 0 0  0  Risk for fall due to : Medication  side effect Medication side effect No Fall Risks    Follow up Falls evaluation completed;Falls prevention discussed Falls prevention discussed;Education provided;Falls evaluation completed Falls evaluation completed  Falls evaluation completed    MEDICARE RISK AT HOME:  Medicare Risk at Home Any stairs in or around the home?: Yes If so, are there any without  handrails?: No Home free of loose throw rugs in walkways, pet beds, electrical cords, etc?: Yes Adequate lighting in your home to reduce risk of falls?: Yes Life alert?: No Use of a cane, walker or w/c?: No Grab bars in the bathroom?: Yes Shower chair or bench in shower?: Yes Elevated toilet seat or a handicapped toilet?: Yes  TIMED UP AND GO:  Was the test performed?  No  Cognitive Function: 6CIT completed        10/27/2023   10:20 AM 10/21/2022   10:18 AM  6CIT Screen  What Year? 0 points 0 points  What month? 0 points 0 points  What time? 0 points 0 points  Count back from 20 0 points 0 points  Months in reverse 0 points 0 points  Repeat phrase 2 points 10 points  Total Score 2 points 10 points    Immunizations Immunization History  Administered Date(s) Administered   Fluad Quad(high Dose 65+) 01/31/2019   Influenza, High Dose Seasonal PF 03/16/2018   Influenza-Unspecified 04/06/2020, 03/15/2021, 03/06/2022   PFIZER(Purple Top)SARS-COV-2 Vaccination 07/05/2019, 07/26/2019, 03/21/2020   Pneumococcal Conjugate-13 07/14/2020   Pneumococcal Polysaccharide-23 09/11/2017    Screening Tests Health Maintenance  Topic Date Due   Hepatitis C Screening  Never done   DTaP/Tdap/Td (1 - Tdap) Never done   Zoster Vaccines- Shingrix (1 of 2) Never done   Lung Cancer Screening  11/28/2022   INFLUENZA VACCINE  01/05/2024   Medicare Annual Wellness (AWV)  10/26/2024   Pneumonia Vaccine 53+ Years old  Completed   DEXA SCAN  Completed   HPV VACCINES  Aged Out   Meningococcal B Vaccine  Aged Out   COVID-19 Vaccine  Discontinued    Health Maintenance  Health Maintenance Due  Topic Date Due   Hepatitis C Screening  Never done   DTaP/Tdap/Td (1 - Tdap) Never done   Zoster Vaccines- Shingrix (1 of 2) Never done   Lung Cancer Screening  11/28/2022   Health Maintenance Items Addressed: Declines Shingrix and TDAP. Declines lung cancer screening at this time. Due for Hep C  screening.  Additional Screening:  Vision Screening: Recommended annual ophthalmology exams for early detection of glaucoma and other disorders of the eye.  Dental Screening: Recommended annual dental exams for proper oral hygiene  Community Resource Referral / Chronic Care Management: CRR required this visit?  No   CCM required this visit?  No   Plan:    I have personally reviewed and noted the following in the patient's chart:   Medical and social history Use of alcohol, tobacco or illicit drugs  Current medications and supplements including opioid prescriptions. Patient is not currently taking opioid prescriptions. Functional ability and status Nutritional status Physical activity Advanced directives List of other physicians Hospitalizations, surgeries, and ER visits in previous 12 months Vitals Screenings to include cognitive, depression, and falls Referrals and appointments  In addition, I have reviewed and discussed with patient certain preventive protocols, quality metrics, and best practice recommendations. A written personalized care plan for preventive services as well as general preventive health recommendations were provided to patient.   Areatha Beecham, LPN   [id number]/61/[id number]  After Visit Summary: (Pick Up) Due to this being a telephonic visit, with patients personalized plan was offered to patient and patient has requested to Pick up at office.  Notes: Nothing significant to report at this time.

## 2023-10-27 NOTE — Patient Instructions (Signed)
 Terri [name] , Thank you for taking time out of your busy schedule to complete your Annual Wellness Visit with me. I enjoyed our conversation and look forward to speaking with you again next year. I, as well as your care team,  appreciate your ongoing commitment to your health goals. Please review the following plan we discussed and let me know if I can assist you in the future. Your Game plan/ To Do List    Referrals: If you haven't heard from the office you've been referred to, please reach out to them at the phone provided.  N/a Follow up Visits: Next Medicare AWV with our clinical staff: 11/01/2024 at 10:10   Have you seen your provider in the last 6 months (3 months if uncontrolled diabetes)? Yes Next Office Visit with your provider: will call to schedule once things get situated  Clinician Recommendations:  Aim for 30 minutes of exercise or brisk walking, 6-8 glasses of water, and 5 servings of fruits and vegetables each day.       This is a list of the screening recommended for you and due dates:  Health Maintenance  Topic Date Due   Hepatitis C Screening  Never done   DTaP/Tdap/Td vaccine (1 - Tdap) Never done   Zoster (Shingles) Vaccine (1 of 2) Never done   Screening for Lung Cancer  11/28/2022   Flu Shot  01/05/2024   Medicare Annual Wellness Visit  10/26/2024   Pneumonia Vaccine  Completed   DEXA scan (bone density measurement)  Completed   HPV Vaccine  Aged Out   Meningitis B Vaccine  Aged Out   COVID-19 Vaccine  Discontinued    Advanced directives: (Copy Requested) Please bring a copy of your health care power of attorney and living will to the office to be added to your chart at your convenience. You can mail to [facility] [address] [place], [place] [address] or email to [email or web address] Advance Care Planning is important because it:  [x]  Makes sure you receive the medical care that is consistent with your values, goals, and  preferences  [x]  It provides guidance to your family and loved ones and reduces their decisional burden about whether or not they are making the right decisions based on your wishes.  Follow the link provided in your after visit summary or read over the paperwork we have mailed to you to help you started getting your Advance Directives in place. If you need assistance in completing these, please reach out to us  so that we can help you!  See attachments for Preventive Care and Fall Prevention Tips.

## 2024-02-23 ENCOUNTER — Ambulatory Visit: Attending: Internal Medicine | Admitting: Internal Medicine

## 2024-02-23 ENCOUNTER — Encounter: Payer: Self-pay | Admitting: Internal Medicine

## 2024-02-23 VITALS — BP 163/77 | HR 71 | Ht 63.5 in | Wt 171.0 lb

## 2024-02-23 DIAGNOSIS — I739 Peripheral vascular disease, unspecified: Secondary | ICD-10-CM | POA: Diagnosis not present

## 2024-02-23 DIAGNOSIS — Z720 Tobacco use: Secondary | ICD-10-CM | POA: Diagnosis not present

## 2024-02-23 DIAGNOSIS — I2510 Atherosclerotic heart disease of native coronary artery without angina pectoris: Secondary | ICD-10-CM | POA: Insufficient documentation

## 2024-02-23 DIAGNOSIS — I10 Essential (primary) hypertension: Secondary | ICD-10-CM | POA: Diagnosis not present

## 2024-02-23 DIAGNOSIS — E782 Mixed hyperlipidemia: Secondary | ICD-10-CM | POA: Insufficient documentation

## 2024-02-23 DIAGNOSIS — R252 Cramp and spasm: Secondary | ICD-10-CM | POA: Insufficient documentation

## 2024-02-23 DIAGNOSIS — I255 Ischemic cardiomyopathy: Secondary | ICD-10-CM | POA: Diagnosis not present

## 2024-02-23 MED ORDER — CARVEDILOL 6.25 MG PO TABS: 6.2500 mg | ORAL_TABLET | Freq: Two times a day (BID) | ORAL | 3 refills | Status: AC

## 2024-02-23 MED ORDER — LISINOPRIL 40 MG PO TABS: 40.0000 mg | ORAL_TABLET | Freq: Every day | ORAL | 3 refills | Status: AC

## 2024-02-23 MED ORDER — ATORVASTATIN CALCIUM 80 MG PO TABS: 80.0000 mg | ORAL_TABLET | Freq: Every day | ORAL | 3 refills | Status: AC

## 2024-02-23 MED ORDER — POTASSIUM CHLORIDE ER 20 MEQ PO TBCR: 20.0000 meq | EXTENDED_RELEASE_TABLET | Freq: Every day | ORAL | 3 refills | Status: AC

## 2024-02-23 MED ORDER — FUROSEMIDE 40 MG PO TABS: 60.0000 mg | ORAL_TABLET | Freq: Every day | ORAL | 3 refills | Status: AC

## 2024-02-23 NOTE — Progress Notes (Signed)
 Cardiology Office Note   Date:  02/23/2024  ID:  Terri [name], DOB 1945/10/13, MRN [id number] PCP: Berneta Elsie Sayre, MD  [facility] Providers Cardiologist:  Emeline FORBES Calender, MD     History of Present Illness Terri [name] is a 78 y.o. female with a past history of CAD s/p anterior STEMI in 01/2015 in [place] with DES x 2 to proximal LAD and NSTEMI in 2023 with DES to proximal and mid RCA, ischemic cardiomyopathy, hypertension, hyperlipidemia, tobacco use, chronic diastolic heart failure, COPD and emphysema, peripheral vascular disease, CKD 3 who presents today for 30-month follow-up.  She states that she has been having dyspnea on exertion which has been chronic but concerning to her.  She does take inhalers which do help but still has exertional shortness of breath.  She does not have any lower extremity edema or chest pain.  She did not take her antihypertensive yet this morning and blood pressure was very high in the office today initially.  Patient admits to bilateral lower extremity pain with ambulation and occasionally at rest.  No other complaints  Tobacco use: 1/2 pack/day Diet mainly consists of: High salty foods   ROS:  Review of Systems  All other systems reviewed and are negative.   Physical Exam  Physical Exam Vitals and nursing note reviewed.  Constitutional:      Appearance: Normal appearance.  HENT:     Head: Normocephalic and atraumatic.  Eyes:     Conjunctiva/sclera: Conjunctivae normal.  Neck:     Vascular: No carotid bruit.  Cardiovascular:     Rate and Rhythm: Normal rate and regular rhythm.     Comments: Decreased bilateral lower extremity pedal pulses Pulmonary:     Effort: Pulmonary effort is normal.     Breath sounds: Wheezing present.  Musculoskeletal:        General: No swelling or tenderness.  Skin:    Coloration: Skin is not jaundiced or pale.  Neurological:     Mental Status: She is alert.     VS:  BP (!) 163/77 (BP  Location: Left Arm)   Pulse 71   Ht 5' 3.5 (1.613 m)   Wt 171 lb (77.6 kg)   SpO2 97%   BMI 29.82 kg/m         Wt Readings from Last 3 Encounters:  02/23/24 171 lb (77.6 kg)  08/22/23 175 lb 6.4 oz (79.6 kg)  07/20/23 180 lb 9.6 oz (81.9 kg)     EKG Interpretation Date/Time:    Ventricular Rate:    PR Interval:    QRS Duration:    QT Interval:    QTC Calculation:   R Axis:      Text Interpretation:      Studies Reviewed   Echocardiogram 06/11/2021: EF 40 to 45% with mid/basal inferior wall hypokinesis and mildly dilated LV  Right/left heart cath 12/23/2021:    Ost LM to Mid LM lesion is 25% stenosed.   Mid LAD-1 lesion is 30% stenosed.   Mid LAD-2 lesion is 60% stenosed.   2nd Diag lesion is 75% stenosed.   Prox Cx lesion is 40% stenosed.   Mid RCA lesion is 35% stenosed.   Previously placed Ost LAD to Mid LAD stent of unknown type is  widely patent.   Previously placed RPAV stent of unknown type is  widely patent.   LV end diastolic pressure is normal.   Nonobstructive CAD. Patent stents in the proximal to mid LAD,  ostial RCA all the way to the PL branch of the RCA Normal LV filling pressures PCWP mean 8 mm Hg. LVEDP 10 mm Hg Normal right heart pressures. PAP mean 19 mm Hg Normal cardiac output.  Index 3.04   Risk Assessment/Calculations    HYPERTENSION CONTROL Vitals:   02/23/24 1109 02/23/24 1115 02/23/24 1144  BP: (!) 170/89 (!) 183/78 (!) 163/77    The patient's blood pressure is elevated above target today.  In order to address the patient's elevated BP: Blood pressure will be monitored at home to determine if medication changes need to be made. (Did not take antihypertensives yet then BP improved with rest)           ASCVD risk score: The ASCVD Risk score (Arnett DK, et al., 2019) failed to calculate for the following reasons:   Risk score cannot be calculated because patient has a medical history suggesting prior/existing ASCVD    ASSESSMENT  Dyspnea on exertion CAD s/p anterior STEMI 01/2015 DES x 2 to proximal LAD DES to proximal and mid RCA 08/23/2021.  On aspirin  81 mg, atorvastatin  80 mg Ischemic cardiomyopathy euvolemic.  Jardiance  was discontinued in the past secondary to cost.  On carvedilol  6.25 milligrams twice daily, lisinopril  40 mg Lasix  60 mg. Hypertension poorly controlled but did not take antihypertensives yet.  Meds carvedilol  6.25 mg twice daily, lisinopril  40 mg daily Bilateral lower extremity pain with decreased pedal pulses concerning for claudication versus electrolyte abnormality Tobacco abuse tobacco cessation counseling provided   Plan  Echocardiogram ABI Lipid panel BMP and magnesium Patient advised to take her blood pressure medications ASAP and go to the ED if she begins having any neurologic changes, chest pain, headache, etc. Cardiac risk counseling and prevention recommendations: Heart healthy/Mediterranean diet with whole grains, fruits, vegetable, fish, lean meats, nuts, and olive oil. Limit salt. Moderate walking, 3-5 times/week for 30-50 minutes each session. Aim for at least 150 minutes.week. Goal should be pace of 3 miles/hour, or walking 1.5 miles in 30 minutes Avoidance of tobacco products. Avoid excess alcohol.  Follow up: 6 months          Signed, Emeline FORBES Calender, MD

## 2024-02-23 NOTE — Patient Instructions (Signed)
 Medication Instructions:  No medication changes were made at this visit. Continue current regimen.   *If you need a refill on your cardiac medications before your next appointment, please call your pharmacy*  Lab Work: To be completed today: lipid panel, BMP, and magnesium  If you have labs (blood work) drawn today and your tests are completely normal, you will receive your results only by: MyChart Message (if you have MyChart) OR A paper copy in the mail If you have any lab test that is abnormal or we need to change your treatment, we will call you to review the results.  Testing/Procedures: Your physician has requested that you have an ankle brachial index (ABI). During this test an ultrasound and blood pressure cuff are used to evaluate the arteries that supply the arms and legs with blood. Allow thirty minutes for this exam. There are no restrictions or special instructions.  Please note: We ask at that you not bring children with you during ultrasound (echo/ vascular) testing. Due to room size and safety concerns, children are not allowed in the ultrasound rooms during exams. Our front office staff cannot provide observation of children in our lobby area while testing is being conducted. An adult accompanying a patient to their appointment will only be allowed in the ultrasound room at the discretion of the ultrasound technician under special circumstances. We apologize for any inconvenience.  Your physician has requested that you have an echocardiogram. Echocardiography is a painless test that uses sound waves to create images of your heart. It provides your doctor with information about the size and shape of your heart and how well your heart's chambers and valves are working. This procedure takes approximately one hour. There are no restrictions for this procedure. Please do NOT wear cologne, perfume, aftershave, or lotions (deodorant is allowed). Please arrive 15 minutes prior to your  appointment time.  Please note: We ask at that you not bring children with you during ultrasound (echo/ vascular) testing. Due to room size and safety concerns, children are not allowed in the ultrasound rooms during exams. Our front office staff cannot provide observation of children in our lobby area while testing is being conducted. An adult accompanying a patient to their appointment will only be allowed in the ultrasound room at the discretion of the ultrasound technician under special circumstances. We apologize for any inconvenience.   Follow-Up: At [facility], you and your health needs are our priority.  As part of our continuing mission to provide you with exceptional heart care, our providers are all part of one team.  This team includes your primary Cardiologist (physician) and Advanced Practice Providers or APPs (Physician Assistants and Nurse Practitioners) who all work together to provide you with the care you need, when you need it.  Your next appointment:   6 month(s)  Provider:   Dr. Kriste

## 2024-02-26 DIAGNOSIS — E782 Mixed hyperlipidemia: Secondary | ICD-10-CM | POA: Diagnosis not present

## 2024-02-26 DIAGNOSIS — I255 Ischemic cardiomyopathy: Secondary | ICD-10-CM | POA: Diagnosis not present

## 2024-02-26 DIAGNOSIS — R252 Cramp and spasm: Secondary | ICD-10-CM | POA: Diagnosis not present

## 2024-02-26 DIAGNOSIS — I10 Essential (primary) hypertension: Secondary | ICD-10-CM | POA: Diagnosis not present

## 2024-02-26 LAB — LIPID PANEL

## 2024-02-27 ENCOUNTER — Ambulatory Visit: Payer: Self-pay | Admitting: Internal Medicine

## 2024-02-27 DIAGNOSIS — I739 Peripheral vascular disease, unspecified: Secondary | ICD-10-CM

## 2024-02-27 LAB — LIPID PANEL
Cholesterol, Total: 120 mg/dL (ref 100–199)
HDL: 43 mg/dL (ref >39)
LDL CALC COMMENT:: 2.8 ratio (ref 0.0–4.4)
LDL Chol Calc (NIH): 55 mg/dL (ref 0–99)
Triglycerides: 124 mg/dL (ref 0–149)
VLDL Cholesterol Cal: 22 mg/dL (ref 5–40)

## 2024-02-27 LAB — BASIC METABOLIC PANEL WITH GFR
BUN/Creatinine Ratio: 19 (ref 12–28)
BUN: 19 mg/dL (ref 8–27)
CO2: 19 mmol/L — ABNORMAL LOW (ref 20–29)
Calcium: 8.9 mg/dL (ref 8.7–10.3)
Chloride: 94 mmol/L — ABNORMAL LOW (ref 96–106)
Creatinine, Ser: 1.01 mg/dL — ABNORMAL HIGH (ref 0.57–1.00)
Glucose: 91 mg/dL (ref 70–99)
Potassium: 4.8 mmol/L (ref 3.5–5.2)
Sodium: 129 mmol/L — ABNORMAL LOW (ref 134–144)
eGFR: 57 mL/min/1.73 — ABNORMAL LOW (ref >59)

## 2024-02-27 LAB — MAGNESIUM: Magnesium: 2.1 mg/dL (ref 1.6–2.3)

## 2024-02-29 NOTE — Telephone Encounter (Addendum)
 I was able to call the patient and give her the lab results. Per Dr. Kriste,  Sodium is low but appears chronically low. Would recommend discussion with her PCP. Otherwise, labs are stable. No changes otherwise.

## 2024-04-08 ENCOUNTER — Ambulatory Visit (HOSPITAL_COMMUNITY)
Admission: RE | Admit: 2024-04-08 | Discharge: 2024-04-08 | Disposition: A | Source: Ambulatory Visit | Attending: Internal Medicine

## 2024-04-08 ENCOUNTER — Ambulatory Visit (HOSPITAL_COMMUNITY)
Admission: RE | Admit: 2024-04-08 | Discharge: 2024-04-08 | Disposition: A | Discharge status: Home / Self Care | Source: Ambulatory Visit | Attending: Internal Medicine | Admitting: Internal Medicine

## 2024-04-08 DIAGNOSIS — I739 Peripheral vascular disease, unspecified: Secondary | ICD-10-CM | POA: Insufficient documentation

## 2024-04-08 DIAGNOSIS — I255 Ischemic cardiomyopathy: Secondary | ICD-10-CM | POA: Insufficient documentation

## 2024-04-08 DIAGNOSIS — Z720 Tobacco use: Secondary | ICD-10-CM | POA: Insufficient documentation

## 2024-04-08 DIAGNOSIS — I10 Essential (primary) hypertension: Secondary | ICD-10-CM | POA: Insufficient documentation

## 2024-04-08 DIAGNOSIS — R252 Cramp and spasm: Secondary | ICD-10-CM

## 2024-04-08 LAB — ECHOCARDIOGRAM COMPLETE
AR max vel: 4.33 cm2
AV Area VTI: 3.5 cm2
AV Area mean vel: 4.48 cm2
AV Mean grad: 1 mmHg
AV Peak grad: 2 mmHg
Ao pk vel: 0.71 m/s
Area-P 1/2: 3.48 cm2
MV M vel: 3.05 m/s
MV Peak grad: 37.2 mmHg
S' Lateral: 3.39 cm

## 2024-04-08 LAB — VAS US ABI WITH/WO TBI
Left ABI: 0.63
Right ABI: 0.67

## 2024-04-10 NOTE — Progress Notes (Signed)
 Please let the patient know that her vascular study shows moderate bilateral peripheral arterial disease of the lower extremities.  This appears worse compared to her prior study from 2022.  She should continue taking aspirin  and her statin.  In the meantime, please refer the patient to vascular surgery for further workup and management.  Thank you, Emeline Calender, DO

## 2024-04-17 ENCOUNTER — Ambulatory Visit: Payer: Self-pay

## 2024-04-17 NOTE — Telephone Encounter (Signed)
 FYI Only or Action Required?: FYI only for provider: appointment scheduled on 11/13.  Patient was last seen in primary care on 07/20/2023 by Berneta Elsie Sayre, MD.  Called Nurse Triage reporting Cough.  Symptoms began several weeks ago.  Symptoms are: gradually worsening.  Triage Disposition: See Physician Within 24 Hours  Patient/caregiver understands and will follow disposition?: Yes     Copied from CRM [id number]. Topic: Clinical - Red Word Triage >> Apr 17, 2024  1:56 PM Suzen RAMAN wrote: Red Word that prompted transfer to Nurse Triage: productive cough with mucous that's not improving and cold. Requesting a sooner appt than 05/23/24 with Berneta if available.       Reason for Disposition  [1] Known COPD or other severe lung disease (i.e., bronchiectasis, cystic fibrosis, lung surgery) AND [2] symptoms getting worse (i.e., increased sputum purulence or amount, increased breathing difficulty  Answer Assessment - Initial Assessment Questions 1. ONSET: When did the cough begin?      2-3 weeks ago  2. SEVERITY: How bad is the cough today?      Moderate  3. SPUTUM: Describe the color of your sputum (e.g., none, dry cough; clear, white, yellow, green)     Clear to green 4. HEMOPTYSIS: Are you coughing up any blood? If Yes, ask: How much? (e.g., flecks, streaks, tablespoons, etc.)     No 5. DIFFICULTY BREATHING: Are you having difficulty breathing? If Yes, ask: How bad is it? (e.g., mild, moderate, severe)      Mild but uses inhaler  6. FEVER: Do you have a fever? If Yes, ask: What is your temperature, how was it measured, and when did it start?     No 7. CARDIAC HISTORY: Do you have any history of heart disease? (e.g., heart attack, congestive heart failure)      Yes 8. LUNG HISTORY: Do you have any history of lung disease?  (e.g., pulmonary embolus, asthma, emphysema)     Yes 9. PE RISK FACTORS: Do you have a history of blood clots? (or: recent  major surgery, recent prolonged travel, bedridden)     No 10. OTHER SYMPTOMS: Do you have any other symptoms? (e.g., runny nose, wheezing, chest pain)       Some fatigue  Protocols used: Cough - Acute Productive-A-AH

## 2024-04-18 ENCOUNTER — Ambulatory Visit: Admitting: Internal Medicine

## 2024-04-18 ENCOUNTER — Ambulatory Visit: Payer: Self-pay | Admitting: Internal Medicine

## 2024-04-18 ENCOUNTER — Ambulatory Visit (INDEPENDENT_AMBULATORY_CARE_PROVIDER_SITE_OTHER)

## 2024-04-18 ENCOUNTER — Encounter: Payer: Self-pay | Admitting: Internal Medicine

## 2024-04-18 VITALS — BP 142/72 | HR 74 | Temp 97.9°F | Ht 62.0 in | Wt 175.4 lb

## 2024-04-18 DIAGNOSIS — R051 Acute cough: Secondary | ICD-10-CM | POA: Diagnosis not present

## 2024-04-18 DIAGNOSIS — I700 Atherosclerosis of aorta: Secondary | ICD-10-CM | POA: Diagnosis not present

## 2024-04-18 DIAGNOSIS — R0602 Shortness of breath: Secondary | ICD-10-CM | POA: Diagnosis not present

## 2024-04-18 DIAGNOSIS — I517 Cardiomegaly: Secondary | ICD-10-CM | POA: Diagnosis not present

## 2024-04-18 LAB — POC COVID19 BINAXNOW: SARS Coronavirus 2 Ag: NEGATIVE

## 2024-04-18 LAB — POCT INFLUENZA A/B
Influenza A, POC: NEGATIVE
Influenza B, POC: NEGATIVE

## 2024-04-18 MED ORDER — AMOXICILLIN-POT CLAVULANATE 875-125 MG PO TABS: 1.0000 | ORAL_TABLET | Freq: Two times a day (BID) | ORAL | 0 refills | Status: AC

## 2024-04-18 MED ORDER — PREDNISONE 20 MG PO TABS: 40.0000 mg | ORAL_TABLET | Freq: Every day | ORAL | 0 refills | Status: AC

## 2024-04-18 NOTE — Progress Notes (Signed)
 [facility] [address] [place] [address] Dept: [phone] Dept Fax: [phone]  Acute Care Office Visit  Subjective:   Terri [name] 22-Aug-1945 04/18/2024  Chief Complaint  Patient presents with   Cough    3-4 weeks wheezing, chest pain when coughing and worse in morning    HPI:  Discussed the use of AI scribe software for clinical note transcription with the patient, who gave verbal consent to proceed.  History of Present Illness   SHIRLEYMAE HAUTH is a 78 year old female who presents with a persistent cough and chest discomfort.  Her symptoms began approximately three to four weeks ago, initially resembling a common cold with sneezing and rhinorrhea. Over time, the symptoms have worsened, particularly the cough, which is persistent and difficult to alleviate. She also experiences chest pain associated with the cough.  She denies fever but notes that her oxygen  saturation, monitored with her watch, has occasionally dropped to 92%. She has been using over-the-counter medications such as Nyquil, DayQuil, various cough medicines, and aspirin , but none have provided significant relief.  She has a history of bronchitis. She has had pneumonia twice in the past.   She uses an albuterol  inhaler daily, which helps alleviate her cough. This inhaler was prescribed by her doctor, and she recently had it refilled. She does not use a nebulizer at home.  She experiences dyspnea, especially when ambulating, and notes that her symptoms are worse in the mornings. Her appetite remains good despite her respiratory symptoms.      The following portions of the patient's history were reviewed and updated as appropriate: past medical history, past surgical history, family history, social history, allergies, medications, and problem list.   Patient Active Problem List   Diagnosis Date Noted   Stage 3a chronic kidney disease (HCC)  11/02/2021   Inclusion cyst 09/14/2021   Gastroesophageal reflux disease 07/29/2021   Chronic bronchitis (HCC) 06/11/2021   Elevated troponin 06/11/2021   Hyponatremia 06/11/2021   Acute on chronic diastolic CHF (congestive heart failure) (HCC) 06/10/2021   Trigger finger, right ring finger 03/08/2021   Lower leg edema 12/29/2020   Protrusion of lumbar intervertebral disc 10/28/2020   Herpes virus disease 06/27/2019   Rash 06/26/2019   Thyroid  mass 01/31/2019   Need for influenza vaccination 01/31/2019   Bilateral asymmetric sensorineural hearing loss 04/23/2018   Presbycusis of right ear 03/29/2018   PVD (peripheral vascular disease) 03/29/2018   Leg pain, posterior, left 02/27/2018   Groin pain, chronic, left 02/27/2018   Seborrheic keratoses 02/27/2018   Seasonal allergic rhinitis due to pollen 10/11/2017   Acute bronchitis with COPD (HCC) 10/11/2017   Essential hypertension 10/11/2017   Pulmonary emphysema (HCC) 10/11/2017   Cough 10/11/2017   Dysfunction of right eustachian tube 10/11/2017   Ischemic cardiomyopathy 10/04/2017   Screen for colon cancer 09/11/2017   Leg cramps 09/11/2017   History of pneumonia 09/11/2017   History of abnormal mammogram 09/11/2017   Need for 23-polyvalent pneumococcal polysaccharide vaccine 09/11/2017   Estrogen deficiency 09/11/2017   Tobacco use 10/04/2016   Coronary artery disease involving native heart 04/28/2015   Hx of microdiscectomy 07/25/2014   Hyperlipidemia 01/16/2013   Chest pain of uncertain etiology 01/10/2013   Family history of heart disease 01/10/2013   Past Medical History:  Diagnosis Date   Aortic insufficiency    a. Mild AI by echo 2014.   Arthritis    CAD (coronary artery disease)  a. Anterior STEMI 01/2015 s/p overlapping DES x2 to prox LAD, DES to prox RCA in [facility].   CHF (congestive heart failure) (HCC)    Chronic back pain    stenosis/HNP   History of bronchitis 06/06/2012   History of  colon polyps    Hyperlipidemia    Ischemic cardiomyopathy    Joint pain    Joint swelling    Myocardial infarction [facility])    Pneumonia 06/06/2012   Smokers' cough (HCC)    Stress headaches    Tobacco abuse    Past Surgical History:  Procedure Laterality Date   ABDOMINAL HYSTERECTOMY     at age 59   BREAST BIOPSY Right    roughly 30 years ago with [facility]    cataract surgery Bilateral    CHOLECYSTECTOMY     colonscopy     ERCP     ESOPHAGOGASTRODUODENOSCOPY     LUMBAR LAMINECTOMY Right 07/25/2014   Procedure: Right L4 Hemilaminectomy, Right L4-5 Laminotomy , Decompression, Microdisectomy;  Surgeon: Oneil JAYSON Herald, MD;  Location: [facility] OR;  Service: Orthopedics;  Laterality: Right;   RIGHT/LEFT HEART CATH AND CORONARY ANGIOGRAPHY N/A 12/23/2021   Procedure: RIGHT/LEFT HEART CATH AND CORONARY ANGIOGRAPHY;  Surgeon: Jordan, Peter M, MD;  Location: [facility] INVASIVE CV LAB;  Service: Cardiovascular;  Laterality: N/A;   SHOULDER SURGERY Right    TONSILLECTOMY     as a child   Family History  Problem Relation Age of Onset   Heart disease Mother    Heart failure Mother    Heart disease Father    Heart failure Father    CAD Other        both parents and multiple siblings with either died or had-related issues   Breast cancer Sister     Current Outpatient Medications:    acetaminophen  (TYLENOL ) 325 MG tablet, Take 325 mg by mouth as needed., Disp: , Rfl:    albuterol  (VENTOLIN  HFA) 108 (90 Base) MCG/ACT inhaler, Inhale 1-2 puffs into the lungs every 6 (six) hours as needed for wheezing or shortness of breath. And or cough., Disp: 1 each, Rfl: 2   amoxicillin -clavulanate (AUGMENTIN ) 875-125 MG tablet, Take 1 tablet by mouth 2 (two) times daily for 7 days., Disp: 14 tablet, Rfl: 0   atorvastatin  (LIPITOR ) 80 MG tablet, Take 1 tablet (80 mg total) by mouth at bedtime., Disp: 90 tablet, Rfl: 3   carvedilol  (COREG ) 6.25 MG tablet, Take 1 tablet (6.25 mg total) by mouth 2 (two) times daily with a  meal., Disp: 180 tablet, Rfl: 3   Cholecalciferol (VITAMIN D3) 10 MCG (400 UNIT) tablet, Take 400 Units by mouth daily., Disp: , Rfl:    [facility] ASPIRIN  81 MG EC tablet, Take 81 mg by mouth in the morning., Disp: , Rfl: 0   fluticasone -salmeterol (WIXELA INHUB) 250-50 MCG/ACT AEPB, Inhale 1 puff into the lungs in the morning and at bedtime. (Patient not taking: Reported on 04/22/2024), Disp: 60 each, Rfl: 5   furosemide  (LASIX ) 40 MG tablet, Take 1.5 tablets (60 mg total) by mouth daily., Disp: 135 tablet, Rfl: 3   lisinopril  (ZESTRIL ) 40 MG tablet, Take 1 tablet (40 mg total) by mouth daily., Disp: 90 tablet, Rfl: 3   Magnesium 300 MG CAPS, Take 150 mg by mouth daily. Take 0.5 tablet daily, Disp: , Rfl:    Potassium Chloride  ER 20 MEQ TBCR, Take 1 tablet (20 mEq total) by mouth daily., Disp: 90 tablet, Rfl: 3 Allergies  Allergen Reactions  Erythromycin Nausea Only     ROS: A complete ROS was performed with pertinent positives/negatives noted in the HPI. The remainder of the ROS are negative.    Objective:   Today's Vitals   04/18/24 1539 04/18/24 1608  BP: (!) 142/72   Pulse: 77 74  Temp: 97.9 F (36.6 C)   TempSrc: Temporal   SpO2: 92% 94%  Weight: 175 lb 6.4 oz (79.6 kg)   Height: 5' 2 (1.575 m)     GENERAL: ill-appearing, nontoxic in NAD. Well nourished.  SKIN: Pink, warm and dry. No rash.  HEENT:    HEAD: Normocephalic, non-traumatic.  EYES: Conjunctive pink without exudate.  NOSE: Septum midline w/o deformity. Nares patent, mucosa pink and non-inflamed w/o drainage. No sinus tenderness.  THROAT: Uvula midline. Oropharynx clear. Tonsils absent. Mucus membranes pink and moist.  NECK: Trachea midline. Full ROM w/o pain or tenderness. No lymphadenopathy.  RESPIRATORY: Chest wall symmetrical. Respirations labored with exertion. Coarse wheezing throughout, rhonchi bilateral lower lobes CARDIAC: S1, S2 present, regular rate and rhythm. Peripheral pulses 2+ bilaterally.   EXTREMITIES: Without clubbing, cyanosis, or edema.  NEUROLOGIC: Steady, even gait.  PSYCH/MENTAL STATUS: Alert, oriented x 3. Cooperative, appropriate mood and affect.    Results for orders placed or performed in visit on 04/18/24  POCT Influenza A/B  Result Value Ref Range   Influenza A, POC Negative Negative   Influenza B, POC Negative Negative  POC COVID-19 BinaxNow  Result Value Ref Range   SARS Coronavirus 2 Ag Negative Negative      Assessment & Plan:  Assessment and Plan    Acute cough Cough persisting for 3-4 weeks with chest pain and dyspnea. Negative for COVID-19 and influenza. Differential includes bronchitis or pneumonia. Oxygen  saturation stable at 94%+ with ambulation in office - Ordered chest x-ray to evaluate for pneumonia  - Prescribed Augmentin  BID for 7 days. - Prescribed prednisone  40 mg daily with breakfast for 5 days. - Advised use of albuterol  inhaler every 4-6 hours as needed for dyspnea or wheezing. - Continue Mucinex daily. - Instructed to call 911 or go to the ER if symptoms worsen.     Meds ordered this encounter  Medications   amoxicillin -clavulanate (AUGMENTIN ) 875-125 MG tablet    Sig: Take 1 tablet by mouth 2 (two) times daily for 7 days.    Dispense:  14 tablet    Refill:  0    Supervising Provider:   THOMPSON, AARON B [[id number]]   predniSONE  (DELTASONE ) 20 MG tablet    Sig: Take 2 tablets (40 mg total) by mouth daily with breakfast for 5 days.    Dispense:  10 tablet    Refill:  0    Supervising Provider:   THOMPSON, AARON B [[id number]]   Orders Placed This Encounter  Procedures   DG Chest 2 View    Standing Status:   Future    Number of Occurrences:   1    Expiration Date:   04/18/2025    Reason for Exam (SYMPTOM  OR DIAGNOSIS REQUIRED):   acute worsening cough, shortness of breath, concern for pneumonia. History of COPD    Preferred imaging location?:   Internal   POCT Influenza A/B   POC COVID-19 BinaxNow   Lab Orders          POCT Influenza A/B         POC COVID-19 BinaxNow     No images are attached to the encounter or orders placed in the encounter.  Return in about 4 days (around 04/22/2024) for bronchitis.   Rosina Senters, FNP

## 2024-04-18 NOTE — Patient Instructions (Signed)
 Take prednisone  and antibiotic as prescribed  Use your albuterol  inhaler every 4-6 hours as needed for shortness of breat or wheezing  If your symptoms worsen, call 911 or go to the ER immediately

## 2024-04-19 NOTE — Addendum Note (Signed)
 Addended by: THEOTIS SHARLET PARAS on: 04/19/2024 05:02 PM   Modules accepted: Orders

## 2024-04-19 NOTE — Telephone Encounter (Signed)
 Pt and daughter Arland are aware of results.  They will monitor blood pressures at home and make this office or her PCP aware if she notices an increase in her BP.

## 2024-04-19 NOTE — Telephone Encounter (Signed)
-----   Message from Rosina Senters sent at 04/18/2024  4:53 PM EST ----- No pneumonia seen on xray , continue medications as prescribed ----- Message ----- From: Interface, Rad Results In Sent: 04/18/2024   4:25 PM EST To: Rosina Senters, FNP

## 2024-04-22 ENCOUNTER — Telehealth: Payer: Self-pay | Admitting: Internal Medicine

## 2024-04-22 ENCOUNTER — Ambulatory Visit (INDEPENDENT_AMBULATORY_CARE_PROVIDER_SITE_OTHER): Admitting: Internal Medicine

## 2024-04-22 ENCOUNTER — Encounter: Payer: Self-pay | Admitting: Internal Medicine

## 2024-04-22 VITALS — BP 142/80 | HR 76 | Temp 97.3°F | Ht 62.0 in | Wt 171.2 lb

## 2024-04-22 DIAGNOSIS — J209 Acute bronchitis, unspecified: Secondary | ICD-10-CM

## 2024-04-22 NOTE — Progress Notes (Signed)
 [facility]IMARY [facility]ARE [facility] [address] [place] [address] Dept: [phone] Dept Fax: [phone]  Acute Care Office Visit  Subjective:   Terri [name] 1946/02/26 04/22/2024  Chief Complaint  Patient presents with   Follow-up    Cough things are better     HPI:  Discussed the use of AI scribe software for clinical note transcription with the patient, who gave verbal consent to proceed.  History of Present Illness   Terri [name] is a 78 year old female who presents for a follow-up visit after an acute episode of bronchitis.  She developed an acute cough and symptoms of bronchitis and was seen in PCP office on November 13th. A chest x-ray was performed, which did not show any pneumonia. She was treated with a steroid pack and Augmentin . COVID-19 and flu tests were negative.  She feels much better today, with significant improvement in her symptoms. She has been taking Augmentin  and prednisone , with the prednisone  course finishing tomorrow. Her wheezing and shortness of breath have improved, and her cough is nearly resolved. She continues to use Mucinex and her albuterol  inhaler. No fever or chest pain.      The following portions of the patient's history were reviewed and updated as appropriate: past medical history, past surgical history, family history, social history, allergies, medications, and problem list.   Patient Active Problem List   Diagnosis Date Noted   Stage 3a chronic kidney disease (HCC) 11/02/2021   Inclusion cyst 09/14/2021   Gastroesophageal reflux disease 07/29/2021   Chronic bronchitis (HCC) 06/11/2021   Elevated troponin 06/11/2021   Hyponatremia 06/11/2021   Acute on chronic diastolic CHF (congestive heart failure) (HCC) 06/10/2021   Trigger finger, right ring finger 03/08/2021   Lower leg edema 12/29/2020   Protrusion of lumbar intervertebral disc 10/28/2020   Herpes virus disease 06/27/2019   Rash  06/26/2019   Thyroid  mass 01/31/2019   Need for influenza vaccination 01/31/2019   Bilateral asymmetric sensorineural hearing loss 04/23/2018   Presbycusis of right ear 03/29/2018   PVD (peripheral vascular disease) 03/29/2018   Leg pain, posterior, left 02/27/2018   Groin pain, chronic, left 02/27/2018   Seborrheic keratoses 02/27/2018   Seasonal allergic rhinitis due to pollen 10/11/2017   Acute bronchitis with COPD (HCC) 10/11/2017   Essential hypertension 10/11/2017   Pulmonary emphysema (HCC) 10/11/2017   Cough 10/11/2017   Dysfunction of right eustachian tube 10/11/2017   Ischemic cardiomyopathy 10/04/2017   Screen for colon cancer 09/11/2017   Leg cramps 09/11/2017   History of pneumonia 09/11/2017   History of abnormal mammogram 09/11/2017   Need for 23-polyvalent pneumococcal polysaccharide vaccine 09/11/2017   Estrogen deficiency 09/11/2017   Tobacco use 10/04/2016   Coronary artery disease involving native heart 04/28/2015   Hx of microdiscectomy 07/25/2014   Hyperlipidemia 01/16/2013   Chest pain of uncertain etiology 01/10/2013   Family history of heart disease 01/10/2013   Past Medical History:  Diagnosis Date   Aortic insufficiency    a. Mild AI by echo 2014.   Arthritis    CAD (coronary artery disease)    a. Anterior STEMI 01/2015 s/p overlapping DES x2 to prox LAD, DES to prox RCA in [facility].   CHF (congestive heart failure) (HCC)    Chronic back pain    stenosis/HNP   History of bronchitis 06/06/2012   History of colon polyps    Hyperlipidemia    Ischemic cardiomyopathy    Joint pain  Joint swelling    Myocardial infarction [facility])    Pneumonia 06/06/2012   Smokers' cough (HCC)    Stress headaches    Tobacco abuse    Past Surgical History:  Procedure Laterality Date   ABDOMINAL HYSTERECTOMY     at age 80   BREAST BIOPSY Right    roughly 30 years ago with [facility]    cataract surgery Bilateral    CHOLECYSTECTOMY     colonscopy      ERCP     ESOPHAGOGASTRODUODENOSCOPY     LUMBAR LAMINECTOMY Right 07/25/2014   Procedure: Right L4 Hemilaminectomy, Right L4-5 Laminotomy , Decompression, Microdisectomy;  Surgeon: Oneil JAYSON Herald, MD;  Location: [facility] OR;  Service: Orthopedics;  Laterality: Right;   RIGHT/LEFT HEART CATH AND CORONARY ANGIOGRAPHY N/A 12/23/2021   Procedure: RIGHT/LEFT HEART CATH AND CORONARY ANGIOGRAPHY;  Surgeon: Jordan, Peter M, MD;  Location: [facility] INVASIVE CV LAB;  Service: Cardiovascular;  Laterality: N/A;   SHOULDER SURGERY Right    TONSILLECTOMY     as a child   Family History  Problem Relation Age of Onset   Heart disease Mother    Heart failure Mother    Heart disease Father    Heart failure Father    CAD Other        both parents and multiple siblings with either died or had-related issues   Breast cancer Sister     Current Outpatient Medications:    acetaminophen  (TYLENOL ) 325 MG tablet, Take 325 mg by mouth as needed., Disp: , Rfl:    albuterol  (VENTOLIN  HFA) 108 (90 Base) MCG/ACT inhaler, Inhale 1-2 puffs into the lungs every 6 (six) hours as needed for wheezing or shortness of breath. And or cough., Disp: 1 each, Rfl: 2   amoxicillin -clavulanate (AUGMENTIN ) 875-125 MG tablet, Take 1 tablet by mouth 2 (two) times daily for 7 days., Disp: 14 tablet, Rfl: 0   atorvastatin  (LIPITOR ) 80 MG tablet, Take 1 tablet (80 mg total) by mouth at bedtime., Disp: 90 tablet, Rfl: 3   carvedilol  (COREG ) 6.25 MG tablet, Take 1 tablet (6.25 mg total) by mouth 2 (two) times daily with a meal., Disp: 180 tablet, Rfl: 3   Cholecalciferol (VITAMIN D3) 10 MCG (400 UNIT) tablet, Take 400 Units by mouth daily., Disp: , Rfl:    [employer] ASPIRIN  81 MG EC tablet, Take 81 mg by mouth in the morning., Disp: , Rfl: 0   furosemide  (LASIX ) 40 MG tablet, Take 1.5 tablets (60 mg total) by mouth daily., Disp: 135 tablet, Rfl: 3   lisinopril  (ZESTRIL ) 40 MG tablet, Take 1 tablet (40 mg total) by mouth daily., Disp: 90 tablet, Rfl: 3    Magnesium 300 MG CAPS, Take 150 mg by mouth daily. Take 0.5 tablet daily, Disp: , Rfl:    Potassium Chloride  ER 20 MEQ TBCR, Take 1 tablet (20 mEq total) by mouth daily., Disp: 90 tablet, Rfl: 3   predniSONE  (DELTASONE ) 20 MG tablet, Take 2 tablets (40 mg total) by mouth daily with breakfast for 5 days., Disp: 10 tablet, Rfl: 0   fluticasone -salmeterol (WIXELA INHUB) 250-50 MCG/ACT AEPB, Inhale 1 puff into the lungs in the morning and at bedtime. (Patient not taking: Reported on 04/22/2024), Disp: 60 each, Rfl: 5 Allergies  Allergen Reactions   Erythromycin Nausea Only     ROS: A complete ROS was performed with pertinent positives/negatives noted in the HPI. The remainder of the ROS are negative.    Objective:   Today's Vitals   04/22/24  1402  BP: (!) 142/80  Pulse: 76  Temp: (!) 97.3 F (36.3 C)  SpO2: 95%  Weight: 171 lb 3.2 oz (77.7 kg)  Height: 5' 2 (1.575 m)    GENERAL: Well-appearing, in NAD. Well nourished.  SKIN: Pink, warm and dry. RESPIRATORY: Chest wall symmetrical. Respirations even and non-labored. Breath sounds clear to auscultation bilaterally.  (+) mild cough CARDIAC: S1, S2 present, regular rate and rhythm. Peripheral pulses 2+ bilaterally.  NEUROLOGIC:  Steady, even gait.  PSYCH/MENTAL STATUS: Alert, oriented x 3. Cooperative, appropriate mood and affect.    No results found for any visits on 04/22/24.    Assessment & Plan:  Assessment and Plan    Acute bronchitis Symptoms improved with treatment. No pneumonia on chest x-ray. COVID and flu tests negative. - Finish prednisone  course. - Finish Augmentin  course. - Continue albuterol  inhaler as needed. - Continue Mucinex every morning.      No orders of the defined types were placed in this encounter.  No orders of the defined types were placed in this encounter.  Lab Orders  No laboratory test(s) ordered today   No images are attached to the encounter or orders placed in the encounter.  Return  for Scheduled Routine Office Visits and as needed.   Rosina Senters, FNP

## 2024-04-22 NOTE — Telephone Encounter (Signed)
 Patient called to say that she would like to be referred to Dr. Gaile New for vascular. Please advise

## 2024-04-22 NOTE — Telephone Encounter (Signed)
 Spoke with pt. A referral has already been placed. Pt told that someone will reach out to make the appointment and to let them know that she wants to see Dr. Serene. Pt verbalized understanding. All questions if any were answered.

## 2024-05-08 ENCOUNTER — Encounter: Payer: Self-pay | Admitting: Vascular Surgery

## 2024-05-08 ENCOUNTER — Ambulatory Visit: Attending: Vascular Surgery | Admitting: Vascular Surgery

## 2024-05-08 VITALS — BP 149/82 | HR 68 | Temp 98.1°F | Ht 62.0 in | Wt 172.0 lb

## 2024-05-08 DIAGNOSIS — I70212 Atherosclerosis of native arteries of extremities with intermittent claudication, left leg: Secondary | ICD-10-CM | POA: Insufficient documentation

## 2024-05-08 NOTE — Progress Notes (Signed)
 Patient ID: Terri [name], female   DOB: Apr 22, 1946, 78 y.o.   MRN: [id number]  Reason for Consult: Follow-up   Referred by Berneta Elsie Sayre,*  Subjective:     HPI:  Terri [name] is a 78 y.o. female previously evaluated for abnormal lower extremity pulses.  She now has worsening leg pain left greater than right.  This occurs with activity but is not consistent and not life-limiting at this time.  She denies rest pain, tissue loss or ulceration.  She continues to smoke daily.  She does take baby aspirin  and a statin daily has a history of coronary artery stenting x 5 no current chest pain.  She does have associated hyperlipidemia.  Past Medical History:  Diagnosis Date   Aortic insufficiency    a. Mild AI by echo 2014.   Arthritis    CAD (coronary artery disease)    a. Anterior STEMI 01/2015 s/p overlapping DES x2 to prox LAD, DES to prox RCA in [facility].   CHF (congestive heart failure) (HCC)    Chronic back pain    stenosis/HNP   History of bronchitis 06/06/2012   History of colon polyps    Hyperlipidemia    Ischemic cardiomyopathy    Joint pain    Joint swelling    Myocardial infarction [facility])    Pneumonia 06/06/2012   Smokers' cough (HCC)    Stress headaches    Tobacco abuse    Family History  Problem Relation Age of Onset   Heart disease Mother    Heart failure Mother    Heart disease Father    Heart failure Father    CAD Other        both parents and multiple siblings with either died or had-related issues   Breast cancer Sister    Past Surgical History:  Procedure Laterality Date   ABDOMINAL HYSTERECTOMY     at age 43   BREAST BIOPSY Right    roughly 30 years ago with [facility]    cataract surgery Bilateral    CHOLECYSTECTOMY     colonscopy     ERCP     ESOPHAGOGASTRODUODENOSCOPY     LUMBAR LAMINECTOMY Right 07/25/2014   Procedure: Right L4 Hemilaminectomy, Right L4-5 Laminotomy , Decompression, Microdisectomy;  Surgeon: Oneil JAYSON Herald,  MD;  Location: [facility] OR;  Service: Orthopedics;  Laterality: Right;   RIGHT/LEFT HEART CATH AND CORONARY ANGIOGRAPHY N/A 12/23/2021   Procedure: RIGHT/LEFT HEART CATH AND CORONARY ANGIOGRAPHY;  Surgeon: Jordan, Peter M, MD;  Location: [facility] INVASIVE CV LAB;  Service: Cardiovascular;  Laterality: N/A;   SHOULDER SURGERY Right    TONSILLECTOMY     as a child    Short Social History:  Social History   Tobacco Use   Smoking status: Every Day    Current packs/day: 0.50    Average packs/day: 0.5 packs/day for 60.0 years (30.0 ttl pk-yrs)    Types: Cigarettes   Smokeless tobacco: Never   Tobacco comments:    08/19/2022 Patient smoke 7-10 cigarettes daily  Substance Use Topics   Alcohol use: No    Allergies  Allergen Reactions   Erythromycin Nausea Only    Current Outpatient Medications  Medication Sig Dispense Refill   acetaminophen  (TYLENOL ) 325 MG tablet Take 325 mg by mouth as needed.     albuterol  (VENTOLIN  HFA) 108 (90 Base) MCG/ACT inhaler Inhale 1-2 puffs into the lungs every 6 (six) hours as needed for wheezing or shortness of breath. And or  cough. 1 each 2   atorvastatin  (LIPITOR ) 80 MG tablet Take 1 tablet (80 mg total) by mouth at bedtime. 90 tablet 3   carvedilol  (COREG ) 6.25 MG tablet Take 1 tablet (6.25 mg total) by mouth 2 (two) times daily with a meal. 180 tablet 3   Cholecalciferol (VITAMIN D3) 10 MCG (400 UNIT) tablet Take 400 Units by mouth daily.     [employer] ASPIRIN  81 MG EC tablet Take 81 mg by mouth in the morning.  0   fluticasone -salmeterol (WIXELA INHUB) 250-50 MCG/ACT AEPB Inhale 1 puff into the lungs in the morning and at bedtime. 60 each 5   furosemide  (LASIX ) 40 MG tablet Take 1.5 tablets (60 mg total) by mouth daily. 135 tablet 3   lisinopril  (ZESTRIL ) 40 MG tablet Take 1 tablet (40 mg total) by mouth daily. 90 tablet 3   Magnesium 300 MG CAPS Take 150 mg by mouth daily. Take 0.5 tablet daily     Potassium Chloride  ER 20 MEQ TBCR Take 1 tablet (20 mEq total) by  mouth daily. 90 tablet 3   No current facility-administered medications for this visit.    Review of Systems  Constitutional:  Constitutional negative. HENT: HENT negative.  Eyes: Eyes negative.  Cardiovascular: Positive for claudication. Negative for leg swelling.  GI: Gastrointestinal negative.  Musculoskeletal: Positive for leg pain.  Neurological: Neurological negative. Hematologic: Hematologic/lymphatic negative.  Psychiatric: Psychiatric negative.        Objective:  Objective   Vitals:   05/08/24 1354  BP: (!) 149/82  Pulse: 68  Temp: 98.1 F (36.7 C)  SpO2: 95%  Weight: 172 lb (78 kg)  Height: 5' 2 (1.575 m)   Body mass index is 31.46 kg/m.  Physical Exam HENT:     Head: Normocephalic.     Nose: Nose normal.  Eyes:     Pupils: Pupils are equal, round, and reactive to light.  Cardiovascular:     Rate and Rhythm: Normal rate.     Pulses:          Femoral pulses are 2+ on the right side and 2+ on the left side.      Popliteal pulses are 0 on the right side and 0 on the left side.  Pulmonary:     Effort: Pulmonary effort is normal.  Abdominal:     General: Abdomen is flat.     Palpations: Abdomen is soft.  Musculoskeletal:        General: Normal range of motion.     Right lower leg: No edema.     Left lower leg: No edema.  Skin:    General: Skin is warm.     Capillary Refill: Capillary refill takes 2 to 3 seconds.  Neurological:     General: No focal deficit present.     Mental Status: She is alert.  Psychiatric:        Mood and Affect: Mood normal.     Data: ABI Findings:  +---------+------------------+-----+----------+--------+  Right   Rt Pressure (mmHg)IndexWaveform  Comment   +---------+------------------+-----+----------+--------+  Brachial 177                                        +---------+------------------+-----+----------+--------+  ATA     83                0.47 monophasic           +---------+------------------+-----+----------+--------+  PTA     119               0.67 monophasic          +---------+------------------+-----+----------+--------+  Great Toe67                0.38 Abnormal            +---------+------------------+-----+----------+--------+   +---------+------------------+-----+----------+-------+  Left    Lt Pressure (mmHg)IndexWaveform  Comment  +---------+------------------+-----+----------+-------+  Brachial 176                                       +---------+------------------+-----+----------+-------+  ATA     101               0.57 monophasic         +---------+------------------+-----+----------+-------+  PTA     111               0.63 monophasic         +---------+------------------+-----+----------+-------+  Great Toe62                0.35 Abnormal           +---------+------------------+-----+----------+-------+   +-------+-----------+-----------+------------+------------+  ABI/TBIToday's ABIToday's TBIPrevious ABIPrevious TBI  +-------+-----------+-----------+------------+------------+  Right .67        .38        .84         .71           +-------+-----------+-----------+------------+------------+  Left  .63        .35        1.0         .71           +-------+-----------+-----------+------------+------------+       Bilateral ABIs and TBIs appear decreased compared to prior study on  12/14/2020.    Summary:  Right: Resting right ankle-brachial index indicates moderate right lower  extremity arterial disease.  Right toe pressure is >60 mmHg which suggests adequate perfusion for  healing.  Left: Resting left ankle-brachial index indicates moderate left lower  extremity arterial disease.  Left toe pressure is >60 mmHg which suggests adequate perfusion for  healing.      Assessment/Plan:     78 year old female with mildly depressed ABIs bilaterally and symptoms of  claudication on the left greater than right but without any evidence of critical limb ischemia such as rest pain or ulceration.  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  As such she will follow-up in 6 months with left lower extremity arterial duplex and repeat ABIs unless she has issues prior to that.  All questions were answered she demonstrated understanding.     Penne Lonni Colorado MD [facility]

## 2024-05-09 ENCOUNTER — Other Ambulatory Visit: Payer: Self-pay | Admitting: *Deleted

## 2024-05-09 DIAGNOSIS — I70212 Atherosclerosis of native arteries of extremities with intermittent claudication, left leg: Secondary | ICD-10-CM

## 2024-05-23 ENCOUNTER — Ambulatory Visit: Admitting: Family Medicine

## 2024-05-23 ENCOUNTER — Encounter: Payer: Self-pay | Admitting: Family Medicine

## 2024-05-23 VITALS — BP 140/76 | HR 74 | Temp 97.7°F | Ht 62.5 in | Wt 170.2 lb

## 2024-05-23 DIAGNOSIS — I739 Peripheral vascular disease, unspecified: Secondary | ICD-10-CM

## 2024-05-23 DIAGNOSIS — R252 Cramp and spasm: Secondary | ICD-10-CM

## 2024-05-23 DIAGNOSIS — B009 Herpesviral infection, unspecified: Secondary | ICD-10-CM

## 2024-05-23 DIAGNOSIS — I10 Essential (primary) hypertension: Secondary | ICD-10-CM

## 2024-05-23 DIAGNOSIS — J432 Centrilobular emphysema: Secondary | ICD-10-CM

## 2024-05-23 DIAGNOSIS — Z6379 Other stressful life events affecting family and household: Secondary | ICD-10-CM

## 2024-05-23 DIAGNOSIS — Z23 Encounter for immunization: Secondary | ICD-10-CM

## 2024-05-23 MED ORDER — VALACYCLOVIR HCL 1 G PO TABS: 500.0000 mg | ORAL_TABLET | Freq: Two times a day (BID) | ORAL | 4 refills | Status: AC

## 2024-05-23 NOTE — Progress Notes (Signed)
 Established Patient Office Visit   Subjective:  Patient ID: Terri [name], female    DOB: [birth year]  Age: 78 y.o. MRN: [id number]  Chief Complaint  Patient presents with   Follow-up    Follow up for overall health, cramping in arms, legs and arms for months  Discuss pneumonia vaccine     HPI Encounter Diagnoses  Name Primary?   Stress due to illness of family member Yes   Nocturnal muscle cramps    Immunization due    Essential hypertension    Herpes virus disease    Centrilobular emphysema (HCC)    PVD (peripheral vascular disease)    For follow-up of acute exacerbation of COPD.  She is feeling much better.  No fevers chills or sputum production.  Breathing is back to baseline.  Continues with nocturnal cramping in arms and legs.  Potassium and magnesium recently measured normal.  She does have extensive peripheral vascular disease.  She is seeing vascular surgery for options of ongoing treatment.  Her husband is currently having a difficult time.  Status post 2 amputations involving the same leg and was recently diagnosed with melanoma.  Continues to smoke and is not motivated to stop at this time.  Seen by pulmonology a few years ago but has been lost to follow-up.   Review of Systems  Constitutional: Negative.   HENT: Negative.    Eyes:  Negative for blurred vision, discharge and redness.  Respiratory: Negative.    Cardiovascular: Negative.   Gastrointestinal:  Negative for abdominal pain.  Genitourinary: Negative.   Musculoskeletal: Negative.  Negative for myalgias.  Skin:  Negative for rash.  Neurological:  Negative for tingling, loss of consciousness and weakness.  Endo/Heme/Allergies:  Negative for polydipsia.    Current Medications[1]   Objective:     BP (!) 140/76 (BP Location: Left Arm, Patient Position: Sitting, Cuff Size: Normal)   Pulse 74   Temp 97.7 F (36.5 C) (Oral)   Ht 5' 2.5 (1.588 m)   Wt 170 lb 3.2 oz (77.2 kg)   SpO2 96%   BMI 30.63 kg/m   BP Readings from Last 3 Encounters:  05/23/24 (!) 140/76  05/08/24 (!) 149/82  04/22/24 (!) 142/80   Wt Readings from Last 3 Encounters:  05/23/24 170 lb 3.2 oz (77.2 kg)  05/08/24 172 lb (78 kg)  04/22/24 171 lb 3.2 oz (77.7 kg)      Physical Exam Constitutional:      General: She is not in acute distress.    Appearance: Normal appearance. She is not ill-appearing, toxic-appearing or diaphoretic.  HENT:     Head: Normocephalic and atraumatic.     Right Ear: External ear normal.     Left Ear: External ear normal.     Mouth/Throat:     Mouth: Mucous membranes are moist.     Pharynx: Oropharynx is clear. No oropharyngeal exudate or posterior oropharyngeal erythema.  Eyes:     General: No scleral icterus.       Right eye: No discharge.        Left eye: No discharge.     Extraocular Movements: Extraocular movements intact.     Conjunctiva/sclera: Conjunctivae normal.     Pupils: Pupils are equal, round, and reactive to light.  Cardiovascular:     Rate and Rhythm: Normal rate and regular rhythm.  Pulmonary:     Effort: Pulmonary effort is normal. No respiratory distress.     Breath sounds: Rhonchi present. No wheezing or  rales.  Abdominal:     General: Bowel sounds are normal.     Tenderness: There is no abdominal tenderness. There is no guarding.  Musculoskeletal:     Cervical back: No rigidity or tenderness.  Skin:    General: Skin is warm and dry.  Neurological:     Mental Status: She is alert and oriented to person, place, and time.  Psychiatric:        Mood and Affect: Mood normal.        Behavior: Behavior normal.      No results found for any visits on 05/23/24.    The ASCVD Risk score (Arnett DK, et al., 2019) failed to calculate for the following reasons:   Risk score cannot be calculated because patient has a medical history suggesting prior/existing ASCVD   * - Cholesterol units were assumed    Assessment & Plan:   Stress due to illness of family  member  Nocturnal muscle cramps  Immunization due -     Pneumococcal conjugate vaccine 20-valent  Essential hypertension -     amLODIPine Besylate; Take 1 tablet (5 mg total) by mouth daily.  Dispense: 90 tablet; Refill: 1  Herpes virus disease -     valACYclovir  HCl; Take 0.5 tablets (500 mg total) by mouth 2 (two) times daily for 7 days.  Dispense: 7 tablet; Refill: 4  Centrilobular emphysema (HCC) -     Pulmonary Visit  PVD (peripheral vascular disease) -     amLODIPine Besylate; Take 1 tablet (5 mg total) by mouth daily.  Dispense: 90 tablet; Refill: 1    Return in about 3 months (around 08/21/2024), or Please see your pharmacy for a tetanus vaccination..  Will try amlodipine 5 mg for improved blood pressure control and hopefully to help PVD.  She will continue follow-up with vascular surgery.  Encouraged tobacco cessation but patient remains on motivated to quit.  Advised patient to stretch out cramps as they come.  Tonic water might be helpful.  Recent potassium and magnesium levels were normal.  [name], MD    [1]  Current Outpatient Medications:    acetaminophen  (TYLENOL ) 325 MG tablet, Take 325 mg by mouth as needed., Disp: , Rfl:    albuterol  (VENTOLIN  HFA) 108 (90 Base) MCG/ACT inhaler, Inhale 1-2 puffs into the lungs every 6 (six) hours as needed for wheezing or shortness of breath. And or cough., Disp: 1 each, Rfl: 2   amLODipine (NORVASC) 5 MG tablet, Take 1 tablet (5 mg total) by mouth daily., Disp: 90 tablet, Rfl: 1   atorvastatin  (LIPITOR ) 80 MG tablet, Take 1 tablet (80 mg total) by mouth at bedtime., Disp: 90 tablet, Rfl: 3   carvedilol  (COREG ) 6.25 MG tablet, Take 1 tablet (6.25 mg total) by mouth 2 (two) times daily with a meal., Disp: 180 tablet, Rfl: 3   Cholecalciferol (VITAMIN D3) 10 MCG (400 UNIT) tablet, Take 400 Units by mouth daily., Disp: , Rfl:    [employer] ASPIRIN  81 MG EC tablet, Take 81 mg by mouth in the morning., Disp: , Rfl: 0    fluticasone -salmeterol (WIXELA INHUB) 250-50 MCG/ACT AEPB, Inhale 1 puff into the lungs in the morning and at bedtime., Disp: 60 each, Rfl: 5   furosemide  (LASIX ) 40 MG tablet, Take 1.5 tablets (60 mg total) by mouth daily., Disp: 135 tablet, Rfl: 3   lisinopril  (ZESTRIL ) 40 MG tablet, Take 1 tablet (40 mg total) by mouth daily., Disp: 90 tablet, Rfl: 3  Magnesium 300 MG CAPS, Take 150 mg by mouth daily. Take 0.5 tablet daily, Disp: , Rfl:    Potassium Chloride  ER 20 MEQ TBCR, Take 1 tablet (20 mEq total) by mouth daily., Disp: 90 tablet, Rfl: 3   valACYclovir  (VALTREX ) 1000 MG tablet, Take 0.5 tablets (500 mg total) by mouth 2 (two) times daily for 7 days., Disp: 7 tablet, Rfl: 4

## 2024-08-22 ENCOUNTER — Ambulatory Visit: Admitting: Family Medicine

## 2024-11-01 ENCOUNTER — Ambulatory Visit

## 2024-11-06 ENCOUNTER — Ambulatory Visit (HOSPITAL_COMMUNITY)

## 2024-11-06 ENCOUNTER — Ambulatory Visit
